# Patient Record
Sex: Female | Born: 1995 | Race: White | Hispanic: No | Marital: Single | Smoking: Current every day smoker
Health system: Southern US, Community
[De-identification: ages and names within clinical notes are randomized; demographics above are authoritative.]

## PROBLEM LIST (undated history)

## (undated) DIAGNOSIS — R12 Heartburn: Secondary | ICD-10-CM

## (undated) DIAGNOSIS — K769 Liver disease, unspecified: Secondary | ICD-10-CM

## (undated) DIAGNOSIS — M6282 Rhabdomyolysis: Secondary | ICD-10-CM

## (undated) DIAGNOSIS — F99 Mental disorder, not otherwise specified: Secondary | ICD-10-CM

## (undated) DIAGNOSIS — K219 Gastro-esophageal reflux disease without esophagitis: Secondary | ICD-10-CM

## (undated) DIAGNOSIS — M25561 Pain in right knee: Secondary | ICD-10-CM

## (undated) DIAGNOSIS — G8929 Other chronic pain: Secondary | ICD-10-CM

## (undated) DIAGNOSIS — M25562 Pain in left knee: Secondary | ICD-10-CM

## (undated) HISTORY — DX: Pain in left knee: M25.562

## (undated) HISTORY — DX: Liver disease, unspecified: K76.9

## (undated) HISTORY — DX: Mental disorder, not otherwise specified: F99

## (undated) HISTORY — PX: NO PAST SURGERIES: SHX2092

## (undated) HISTORY — DX: Pain in right knee: M25.561

## (undated) HISTORY — PX: MOUTH SURGERY: SHX715

## (undated) HISTORY — DX: Rhabdomyolysis: M62.82

## (undated) HISTORY — DX: Other chronic pain: G89.29

## (undated) HISTORY — DX: Heartburn: R12

---
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
## 2017-01-29 ENCOUNTER — Telehealth: Payer: Self-pay | Admitting: *Deleted

## 2017-01-29 ENCOUNTER — Telehealth: Payer: Self-pay | Admitting: Obstetrics & Gynecology

## 2017-01-29 NOTE — Telephone Encounter (Signed)
LMOVM returning call 

## 2017-01-29 NOTE — Telephone Encounter (Signed)
Patient states she feels like Nexplanon is poking out when she moves a certain way. States she felt it earlier today and has not noticed any swelling or redness to area . Informed patient that Nexplanon has to get settled in arm and to let us know if she has swelling or redness. Advised to call us in the next day or so if she feels like it needs to be looked at. Verbalized understanding.

## 2017-02-26 ENCOUNTER — Emergency Department (HOSPITAL_COMMUNITY)
Admission: EM | Admit: 2017-02-26 | Discharge: 2017-02-26 | Disposition: A | Discharge status: Home / Self Care | Payer: Medicaid Other

## 2017-05-09 ENCOUNTER — Encounter: Payer: Medicaid Other | Admitting: Women's Health

## 2017-05-09 ENCOUNTER — Telehealth: Payer: Self-pay | Admitting: *Deleted

## 2017-05-09 NOTE — Telephone Encounter (Signed)
Pt states that she doesn't want to have her nexplanon taken out. She was concerned because of weight gain. She states that she will try diet and exercise and see if she can keep some weight off. Pt requested that we cancel today's appointment.

## 2017-12-06 ENCOUNTER — Encounter (HOSPITAL_COMMUNITY): Payer: Self-pay | Admitting: Emergency Medicine

## 2017-12-06 ENCOUNTER — Other Ambulatory Visit: Payer: Self-pay

## 2017-12-06 ENCOUNTER — Emergency Department (HOSPITAL_COMMUNITY)
Admission: EM | Admit: 2017-12-06 | Discharge: 2017-12-06 | Disposition: A | Discharge status: Home / Self Care | Payer: No Typology Code available for payment source | Attending: Emergency Medicine | Admitting: Emergency Medicine

## 2017-12-06 DIAGNOSIS — Y92410 Unspecified street and highway as the place of occurrence of the external cause: Secondary | ICD-10-CM | POA: Insufficient documentation

## 2017-12-06 DIAGNOSIS — Y9389 Activity, other specified: Secondary | ICD-10-CM | POA: Insufficient documentation

## 2017-12-06 DIAGNOSIS — T07XXXA Unspecified multiple injuries, initial encounter: Secondary | ICD-10-CM

## 2017-12-06 DIAGNOSIS — Z79899 Other long term (current) drug therapy: Secondary | ICD-10-CM | POA: Insufficient documentation

## 2017-12-06 DIAGNOSIS — R51 Headache: Secondary | ICD-10-CM | POA: Insufficient documentation

## 2017-12-06 DIAGNOSIS — Y999 Unspecified external cause status: Secondary | ICD-10-CM | POA: Diagnosis not present

## 2017-12-06 DIAGNOSIS — F17210 Nicotine dependence, cigarettes, uncomplicated: Secondary | ICD-10-CM | POA: Diagnosis not present

## 2017-12-06 DIAGNOSIS — S8011XA Contusion of right lower leg, initial encounter: Secondary | ICD-10-CM | POA: Diagnosis not present

## 2017-12-06 DIAGNOSIS — S8012XA Contusion of left lower leg, initial encounter: Secondary | ICD-10-CM | POA: Insufficient documentation

## 2017-12-06 DIAGNOSIS — M79604 Pain in right leg: Secondary | ICD-10-CM | POA: Insufficient documentation

## 2017-12-06 DIAGNOSIS — M79605 Pain in left leg: Secondary | ICD-10-CM | POA: Diagnosis present

## 2017-12-06 MED ORDER — CYCLOBENZAPRINE HCL 10 MG PO TABS: 10.0000 mg | ORAL_TABLET | Freq: Two times a day (BID) | ORAL | 0 refills | Status: DC | PRN

## 2017-12-06 MED ORDER — CYCLOBENZAPRINE HCL 10 MG PO TABS
10.0000 mg | ORAL_TABLET | Freq: Once | ORAL | Status: AC
  Administered 2017-12-06: 10 mg via ORAL
  Filled 2017-12-06: qty 1

## 2017-12-06 MED ORDER — HYDROCODONE-ACETAMINOPHEN 5-325 MG PO TABS
1.0000 | ORAL_TABLET | Freq: Once | ORAL | Status: AC
  Administered 2017-12-06: 1 via ORAL
  Filled 2017-12-06: qty 1

## 2017-12-06 MED ORDER — NAPROXEN 500 MG PO TABS: 500.0000 mg | ORAL_TABLET | Freq: Two times a day (BID) | ORAL | 0 refills | Status: DC
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
## 2017-12-06 NOTE — Discharge Instructions (Addendum)
We are treating your pain and muscle spasm. Do not drive while taking the muscle relaxer as it will make you sleepy.

## 2017-12-12 ENCOUNTER — Emergency Department (HOSPITAL_COMMUNITY): Payer: Medicaid Other

## 2017-12-12 ENCOUNTER — Other Ambulatory Visit: Payer: Self-pay

## 2017-12-12 ENCOUNTER — Emergency Department (HOSPITAL_COMMUNITY)
Admission: EM | Admit: 2017-12-12 | Discharge: 2017-12-12 | Disposition: A | Discharge status: Home / Self Care | Payer: Medicaid Other | Attending: Emergency Medicine | Admitting: Emergency Medicine

## 2017-12-12 ENCOUNTER — Encounter (HOSPITAL_COMMUNITY): Payer: Self-pay

## 2017-12-12 DIAGNOSIS — F17210 Nicotine dependence, cigarettes, uncomplicated: Secondary | ICD-10-CM | POA: Insufficient documentation

## 2017-12-12 DIAGNOSIS — Y929 Unspecified place or not applicable: Secondary | ICD-10-CM | POA: Diagnosis not present

## 2017-12-12 DIAGNOSIS — Y939 Activity, unspecified: Secondary | ICD-10-CM | POA: Diagnosis not present

## 2017-12-12 DIAGNOSIS — S9032XA Contusion of left foot, initial encounter: Secondary | ICD-10-CM

## 2017-12-12 DIAGNOSIS — Z79899 Other long term (current) drug therapy: Secondary | ICD-10-CM | POA: Insufficient documentation

## 2017-12-12 DIAGNOSIS — S99922A Unspecified injury of left foot, initial encounter: Secondary | ICD-10-CM | POA: Diagnosis present

## 2017-12-12 DIAGNOSIS — Y999 Unspecified external cause status: Secondary | ICD-10-CM | POA: Insufficient documentation

## 2017-12-12 DIAGNOSIS — W208XXA Other cause of strike by thrown, projected or falling object, initial encounter: Secondary | ICD-10-CM | POA: Insufficient documentation

## 2017-12-12 NOTE — ED Notes (Signed)
Patient transported to X-ray 

## 2017-12-12 NOTE — Discharge Instructions (Addendum)
Elevate and apply ice packs on and off to your foot.  Call the orthopedic provider listed to arrange a follow-up appointment in 1 week if not improving.

## 2017-12-12 NOTE — ED Triage Notes (Signed)
Pt reports she was moving something heavy this morning and it was dropped on her left foot. Reports pain to side of foot. Slight bruising noted

## 2017-12-13 NOTE — ED Provider Notes (Signed)
[facility] EMERGENCY DEPARTMENT Provider Note   CSN: [id number] Arrival date & time: 12/12/17  1152     History   Chief Complaint Chief Complaint  Patient presents with  . Foot Pain    HPI Kimberly Norris is a 22 y.o. female.  HPI   Kimberly Norris is a 22 y.o. female who presents to the Emergency Department complaining of pain and swelling to the top of her left foot.  She states that a large piece of glass fell on the top of her foot.  Injury occurred on the morning of arrival.  She denies laceration or glass breakage.  She complains of pain along the top and lateral side of her foot.  Pain is worse with weightbearing.  She denies numbness or pain proximal to the ankle.    Past Medical History:  Diagnosis Date  . Bilateral chronic knee pain 04/16/2012  . Heartburn   . Mental disorder    anxiety, depression  . Rhabdomyolysis     Patient Active Problem List   Diagnosis Date Noted  . Nexplanon insertion 12/28/2016  . Traumatic rhabdomyolysis (HCC) 07/12/2015  . Rhabdomyolysis 07/12/2015  . BMI (body mass index), pediatric, 95-99% for age 28/16/2014  . 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 Bilateral chronic knee pain 04/16/2012    History reviewed. No pertinent surgical history.   OB History    Gravida  0   Para  0   Term  0   Preterm  0   AB  0   Living  0     SAB  0   TAB  0   Ectopic  0   Multiple  0   Live Births  0            Home Medications    Prior to Admission medications   Medication Sig Start Date End Date Taking? Authorizing Provider  cyclobenzaprine (FLEXERIL) 10 MG tablet Take 1 tablet (10 mg total) by mouth 2 (two) times daily as needed for muscle spasms. 12/06/17   Janne NapoleonNeese, Hope M, NP  naproxen (NAPROSYN) 500 MG tablet Take 1 tablet (500 mg total) by mouth 2 (two) times daily. 12/06/17   Janne NapoleonNeese, Hope M, NP  Norgestimate-Ethinyl Estradiol Triphasic (TRI-SPRINTEC) 0.18/0.215/0.25 MG-35 MCG tablet Take 1 tablet by  mouth daily.    [provider]  Olopatadine HCl (PATADAY) 0.2 % SOLN Apply to eye daily.    [provider]    Family History Family History  Problem Relation Age of Onset  . Autism Sister        1 sister    Social History Social History   Tobacco Use  . Smoking status: Current Every Day Smoker    Packs/day: 0.50    Types: Cigarettes  . Smokeless tobacco: Never Used  . Tobacco comment: smokes 1-2 cig daily  Substance Use Topics  . Alcohol use: No  . Drug use: No     Allergies   Patient has no known allergies.   Review of Systems Review of Systems  Constitutional: Negative for chills and fever.  Genitourinary: Negative for difficulty urinating and dysuria.  Musculoskeletal: Positive for arthralgias and joint swelling.       Left foot pain and swelling  Skin: Negative for color change and wound.     Physical Exam Updated Vital Signs BP 117/68   Pulse 68   Temp 98.5 F (36.9 C) (Oral)   Resp 16   Ht 5\' 1"  (1.549 m)  Wt 77.1 kg   SpO2 100%   BMI 32.12 kg/m   Physical Exam  Constitutional: She appears well-developed and well-nourished. No distress.  HENT:  Head: Atraumatic.  Cardiovascular: Normal rate, regular rhythm and intact distal pulses.  Pulmonary/Chest: Effort normal and breath sounds normal.  Musculoskeletal: She exhibits edema and tenderness. She exhibits no deformity.  Tender to palpation of the anterior and lateral left foot.  No bony deformity.  Ankle nontender.  No edema or tenderness of the toes.  No open wounds.  Neurological: She is alert. No sensory deficit.  Skin: Skin is warm and dry. Capillary refill takes less than 2 seconds. No erythema.  Nursing note and vitals reviewed.    ED Treatments / Results  Labs (all labs ordered are listed, but only abnormal results are displayed) Labs Reviewed - No data to display  EKG None  Radiology Dg Foot Complete Left  Result Date: 12/12/2017 CLINICAL DATA:   22 year old female status post blunt trauma from dropped heavy piece of glass. Pain and swelling. EXAM: LEFT FOOT - COMPLETE 3+ VIEW COMPARISON:  None. FINDINGS: Bone mineralization is within normal limits. There is no evidence of fracture or dislocation. There is no evidence of arthropathy or other focal bone abnormality. No discrete soft tissue injury. No subcutaneous gas. No radiopaque foreign body identified. IMPRESSION: Negative. Electronically Signed   By: Odessa Fleming M.D.   On: 12/12/2017 12:37    Procedures Procedures (including critical care time)  Medications Ordered in ED Medications - No data to display   Initial Impression / Assessment and Plan / ED Course  I have reviewed the triage vital signs and the nursing notes.  Pertinent labs & imaging results that were available during my care of the patient were reviewed by me and considered in my medical decision making (see chart for details).     Patient with contusion of left foot.  Neurovascularly intact.  Compartments are soft.  She agrees to RICE therapy and outpatient follow-up if needed.  Final Clinical Impressions(s) / ED Diagnoses   Final diagnoses:  Contusion of left foot, initial encounter    ED Discharge Orders    None       Rosey Bath 12/13/17 2202    Donnetta Hutching, MD 12/14/17 [phone]

## 2018-04-23 ENCOUNTER — Other Ambulatory Visit: Payer: Self-pay | Admitting: Adult Health

## 2018-06-21 ENCOUNTER — Other Ambulatory Visit: Payer: Self-pay | Admitting: Adult Health

## 2018-07-24 ENCOUNTER — Telehealth: Payer: Self-pay | Admitting: Adult Health

## 2018-07-24 NOTE — Telephone Encounter (Signed)

## 2018-07-25 ENCOUNTER — Encounter: Payer: Self-pay | Admitting: Adult Health

## 2018-07-25 ENCOUNTER — Ambulatory Visit (INDEPENDENT_AMBULATORY_CARE_PROVIDER_SITE_OTHER): Payer: Medicaid Other | Admitting: Adult Health

## 2018-07-25 ENCOUNTER — Other Ambulatory Visit (HOSPITAL_COMMUNITY)
Admission: RE | Admit: 2018-07-25 | Discharge: 2018-07-25 | Disposition: A | Discharge status: Home / Self Care | Payer: Medicaid Other | Source: Ambulatory Visit | Attending: Adult Health | Admitting: Adult Health

## 2018-07-25 ENCOUNTER — Other Ambulatory Visit: Payer: Self-pay

## 2018-07-25 VITALS — BP 128/65 | HR 87 | Ht 61.2 in | Wt 195.8 lb

## 2018-07-25 DIAGNOSIS — Z0000 Encounter for general adult medical examination without abnormal findings: Secondary | ICD-10-CM

## 2018-07-25 DIAGNOSIS — Z975 Presence of (intrauterine) contraceptive device: Secondary | ICD-10-CM

## 2018-07-25 DIAGNOSIS — Z01419 Encounter for gynecological examination (general) (routine) without abnormal findings: Secondary | ICD-10-CM | POA: Insufficient documentation

## 2018-07-25 NOTE — Addendum Note (Signed)
Addended by: Diona Fanti A on: 07/25/2018 10:56 AM   Modules accepted: Orders

## 2018-07-25 NOTE — Progress Notes (Signed)
Patient ID: Kimberly Norris, female   DOB: 10-23-1995, 23 y.o.   MRN: [id number] History of Present Illness: Kimberly Norris is a 23 year old white female, single, G0P0, in for a well woman gyn exam and first pap. PCP is [facility].    Current Medications, Allergies, Past Medical History, Past Surgical History, Family History and Social History were reviewed in [employer] record.     Review of Systems: Patient denies any headaches, hearing loss, fatigue, blurred vision, shortness of breath, chest pain, abdominal pain, problems with bowel movements, urination, or intercourse. No joint pain or mood swings. She has nexplanon and is trying to lose weight    Physical Exam:BP 128/65 (BP Location: Right Arm, Patient Position: Sitting, Cuff Size: Normal)   Pulse 87   Ht 5' 1.2" (1.554 m)   Wt 195 lb 12.8 oz (88.8 kg)   BMI 36.75 kg/m  General:  Well developed, well nourished, no acute distress Skin:  Warm and dry Neck:  Midline trachea, normal thyroid, good ROM, no lymphadenopathy Lungs; Clear to auscultation bilaterally Breast:  No dominant palpable mass, retraction, or nipple discharge Cardiovascular: Regular rate and rhythm Abdomen:  Soft, non tender, no hepatosplenomegaly Pelvic:  External genitalia is normal in appearance, no lesions.  The vagina is normal in appearance. Urethra has no lesions or masses. The cervix is nulliparous, pap with GC/CHL performed  Uterus is felt to be normal size, shape, and contour.  No adnexal masses or tenderness noted.Bladder is non tender, no masses felt. Extremities/musculoskeletal:  No swelling or varicosities noted, no clubbing or cyanosis Psych:  No mood changes, alert and cooperative,seems happy PHQ 9 score is 2, denies being suicidal,just stressed Fall risk is low. Examination chaperoned by Levy Pupa LPN. She declines labs.   Impression: 1. Encounter for gynecological examination with Papanicolaou smear of cervix   2. Nexplanon in  place       Plan: Physical in 1 year Pap in 3 if normal Use condoms Will want another nexplanon when this one due out in 2021

## 2018-07-26 LAB — CYTOLOGY - PAP
Adequacy: ABSENT
Chlamydia: NEGATIVE
Diagnosis: NEGATIVE
Neisseria Gonorrhea: NEGATIVE

## 2018-10-10 ENCOUNTER — Other Ambulatory Visit: Payer: Self-pay

## 2018-10-10 DIAGNOSIS — Z20822 Contact with and (suspected) exposure to covid-19: Secondary | ICD-10-CM

## 2018-10-11 LAB — NOVEL CORONAVIRUS, NAA: SARS-CoV-2, NAA: NOT DETECTED

## 2018-11-01 ENCOUNTER — Telehealth: Payer: Self-pay | Admitting: *Deleted

## 2018-11-01 NOTE — Telephone Encounter (Signed)
Called patient back and let her know that kim said no need to worry. Pt will call back if she feels like she wants to do self swab.

## 2018-11-01 NOTE — Telephone Encounter (Signed)
Called patient back and discussed her concerns. She states that she has never had a period in the two years that shes been on the nexplanon. She recently took antibiotics for a chest cold this week. And then starting bleeding a couple days after finishing. It is bleeding like a period and cramping. Also has nausea. Advised she can take tylenol for cramps. She asked if I would check with a provider to see if she should be worried. I told her I would route conversation to provider and let her know what they say. No other questions or concerns at this time.

## 2018-11-01 NOTE — Telephone Encounter (Signed)
Patient states she was prescribed antibiotics by her PCP and is now bleeding. She has the Nexplanon and has never had bleeding before when taking antibiotics and wants to know if the two are related.

## 2018-11-14 ENCOUNTER — Encounter: Payer: Medicaid Other | Admitting: Advanced Practice Midwife

## 2018-12-02 ENCOUNTER — Encounter: Payer: Medicaid Other | Admitting: Advanced Practice Midwife

## 2018-12-05 ENCOUNTER — Other Ambulatory Visit: Payer: Self-pay

## 2018-12-05 ENCOUNTER — Ambulatory Visit (INDEPENDENT_AMBULATORY_CARE_PROVIDER_SITE_OTHER): Payer: Medicaid Other | Admitting: Advanced Practice Midwife

## 2018-12-05 ENCOUNTER — Encounter: Payer: Self-pay | Admitting: Advanced Practice Midwife

## 2018-12-05 VITALS — BP 130/77 | HR 77 | Ht 61.0 in | Wt 200.0 lb

## 2018-12-05 DIAGNOSIS — Z3046 Encounter for surveillance of implantable subdermal contraceptive: Secondary | ICD-10-CM | POA: Diagnosis not present

## 2018-12-05 NOTE — Progress Notes (Addendum)
GYNECOLOGY CLINIC PROCEDURE NOTE  Kimberly Norris is a 23 y.o. G0P0000 here for Nexplanon removal due to an almost 60 lb. weight gain. She would like to see if the Nexplanon is what is the cause of her weight gain. Last pap smear was on 07/25/2018 and was normal. No other gynecologic concerns. She is not having sex with anyone now and doesn't want contraception. Options discussed--condoms encouraged for any new partners.  Nexplanon Removal Patient identified, informed consent performed, consent signed. Appropriate time out taken. Nexplanon site identified. Area prepped in usual sterile fashon. Two ml of 1% lidocaine was used to anesthetize the area at the distal end of the implant. A small stab incision was made right beside the implant on the distal portion. The Nexplanon rod was grasped using hemostats and removed without difficulty.  There was minimal blood loss. There were no complications. Steri-strips were applied over the small incision.  A pressure bandage was applied to reduce any bruising.  The patient tolerated the procedure well and was given post procedure instructions.  Patient is not currently sexually active and does not plan to use contraception at this time. Patient was given education on other forms of contraception and told to call the office if her situation changes.  Nigel Berthold, CNM was at bedside for entire procedure.   Maryagnes Amos, SNM 10:06 AM

## 2019-02-13 ENCOUNTER — Encounter: Payer: Medicaid Other | Admitting: Advanced Practice Midwife

## 2019-03-06 ENCOUNTER — Emergency Department (HOSPITAL_COMMUNITY)
Admission: EM | Admit: 2019-03-06 | Discharge: 2019-03-06 | Disposition: A | Discharge status: Home / Self Care | Payer: Medicaid Other | Attending: Emergency Medicine | Admitting: Emergency Medicine

## 2019-03-06 ENCOUNTER — Encounter (HOSPITAL_COMMUNITY): Payer: Self-pay | Admitting: Emergency Medicine

## 2019-03-06 ENCOUNTER — Other Ambulatory Visit: Payer: Self-pay

## 2019-03-06 ENCOUNTER — Encounter: Payer: Medicaid Other | Admitting: Advanced Practice Midwife

## 2019-03-06 DIAGNOSIS — N390 Urinary tract infection, site not specified: Secondary | ICD-10-CM | POA: Diagnosis not present

## 2019-03-06 DIAGNOSIS — R1905 Periumbilic swelling, mass or lump: Secondary | ICD-10-CM | POA: Diagnosis present

## 2019-03-06 DIAGNOSIS — F17210 Nicotine dependence, cigarettes, uncomplicated: Secondary | ICD-10-CM | POA: Diagnosis not present

## 2019-03-06 LAB — COMPREHENSIVE METABOLIC PANEL
ALT: 21 U/L (ref 0–44)
AST: 19 U/L (ref 15–41)
Albumin: 4.2 g/dL (ref 3.5–5.0)
Alkaline Phosphatase: 67 U/L (ref 38–126)
Anion gap: 9 (ref 5–15)
BUN: 6 mg/dL (ref 6–20)
CO2: 26 mmol/L (ref 22–32)
Calcium: 9.4 mg/dL (ref 8.9–10.3)
Chloride: 104 mmol/L (ref 98–111)
Creatinine, Ser: 0.66 mg/dL (ref 0.44–1.00)
GFR calc Af Amer: >60 mL/min (ref >60)
GFR calc non Af Amer: >60 mL/min (ref >60)
Glucose, Bld: 93 mg/dL (ref 70–99)
Potassium: 4.1 mmol/L (ref 3.5–5.1)
Sodium: 139 mmol/L (ref 135–145)
Total Bilirubin: 0.6 mg/dL (ref 0.3–1.2)
Total Protein: 7.6 g/dL (ref 6.5–8.1)

## 2019-03-06 LAB — CBC WITH DIFFERENTIAL/PLATELET
Abs Immature Granulocytes: 0.04 10*3/uL (ref 0.00–0.07)
Basophils Absolute: 0.1 10*3/uL (ref 0.0–0.1)
Basophils Relative: 1 %
Eosinophils Absolute: 0.2 10*3/uL (ref 0.0–0.5)
Eosinophils Relative: 2 %
HCT: 42.6 % (ref 36.0–46.0)
Hemoglobin: 13.9 g/dL (ref 12.0–15.0)
Immature Granulocytes: 0 %
Lymphocytes Relative: 28 %
Lymphs Abs: 3 10*3/uL (ref 0.7–4.0)
MCH: 32.3 pg (ref 26.0–34.0)
MCHC: 32.6 g/dL (ref 30.0–36.0)
MCV: 98.8 fL (ref 80.0–100.0)
Monocytes Absolute: 0.5 10*3/uL (ref 0.1–1.0)
Monocytes Relative: 5 %
Neutro Abs: 7 10*3/uL (ref 1.7–7.7)
Neutrophils Relative %: 64 %
Platelets: 314 10*3/uL (ref 150–400)
RBC: 4.31 MIL/uL (ref 3.87–5.11)
RDW: 12.4 % (ref 11.5–15.5)
WBC: 10.8 10*3/uL — ABNORMAL HIGH (ref 4.0–10.5)
nRBC: 0 % (ref 0.0–0.2)

## 2019-03-06 LAB — URINALYSIS, ROUTINE W REFLEX MICROSCOPIC
Bilirubin Urine: NEGATIVE
Glucose, UA: NEGATIVE mg/dL
Ketones, ur: NEGATIVE mg/dL
Nitrite: NEGATIVE
Protein, ur: 30 mg/dL — AB
Specific Gravity, Urine: 1.021 (ref 1.005–1.030)
pH: 7 (ref 5.0–8.0)

## 2019-03-06 LAB — PREGNANCY, URINE: Preg Test, Ur: NEGATIVE

## 2019-03-06 MED ORDER — CEPHALEXIN 500 MG PO CAPS
500.0000 mg | ORAL_CAPSULE | Freq: Once | ORAL | Status: AC
  Administered 2019-03-06: 500 mg via ORAL
  Filled 2019-03-06: qty 1

## 2019-03-06 MED ORDER — CEPHALEXIN 500 MG PO CAPS
500.0000 mg | ORAL_CAPSULE | Freq: Four times a day (QID) | ORAL | 0 refills | Status: DC
Start: 2019-03-06 — End: 2019-08-18

## 2019-03-06 NOTE — ED Provider Notes (Signed)
[facility] Provider Note   CSN: [id number] Arrival date & time: 03/06/19  1220     History No chief complaint on file.   Kimberly Norris is a 24 y.o. female.  HPI     Kimberly Norris is a 24 y.o. female who presents to the Emergency Department requesting evaluation of a localized area of swelling of her right periumbilical area.  She has noticed the swelling since November.  She describes tenderness and nausea when the area is palpated deeply.  She denies known trauma or previous abdominal surgeries.  No fever or chills.  She admits to increased urinary frequency, but denies burning or pain with urination.  No bowel changes, no vomiting.  She states that she was concerned that this may be an abscess or something serious and is here requesting evaluation.  No new or worsening symptoms.    Past Medical History:  Diagnosis Date  . Bilateral chronic knee pain 04/16/2012  . Heartburn   . Mental disorder    anxiety, depression  . Rhabdomyolysis     Patient Active Problem List   Diagnosis Date Noted  . Encounter for gynecological examination with Papanicolaou smear of cervix 07/25/2018  . Traumatic rhabdomyolysis ([place]) 07/12/2015  . Rhabdomyolysis 07/12/2015  . BMI (body mass index), pediatric, 95-99% for age 71/16/2014  . 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 Bilateral chronic knee pain 04/16/2012    No past surgical history on file.   OB History    Gravida  0   Para  0   Term  0   Preterm  0   AB  0   Living  0     SAB  0   TAB  0   Ectopic  0   Multiple  0   Live Births  0           Family History  Problem Relation Age of Onset  . Autism Sister        1 sister    Social History   Tobacco Use  . Smoking status: Current Every Day Smoker    Packs/day: 0.50    Types: Cigarettes  . Smokeless tobacco: Never Used  . Tobacco comment: smokes 1-2 cig daily  Substance Use Topics  . Alcohol use: No  . Drug use: No     Home Medications Prior to Admission medications   Not on File    Allergies    Patient has no known allergies.  Review of Systems   Review of Systems  Constitutional: Negative for chills and fever.  Respiratory: Negative for shortness of breath.   Cardiovascular: Negative for chest pain.  Gastrointestinal: Positive for abdominal pain and nausea. Negative for constipation, diarrhea and vomiting.  Genitourinary: Negative for decreased urine volume, difficulty urinating, dysuria and flank pain.  Musculoskeletal: Negative for arthralgias and joint swelling.  Skin: Negative for color change and wound.  Neurological: Negative for weakness, numbness and headaches.  Hematological: Negative for adenopathy.    Physical Exam Updated Vital Signs BP 135/85 (BP Location: Left Arm)   Pulse 89   Temp 99 F (37.2 C) (Oral)   Resp 18   Ht 5\' 1"  (1.549 m)   Wt 86.2 kg   LMP 03/01/2019   SpO2 99%   BMI 35.90 kg/m   Physical Exam Vitals and nursing note reviewed.  Constitutional:      General: She is not in acute distress.    Appearance: She is well-developed. She  is not ill-appearing.  HENT:     Mouth/Throat:     Mouth: Mucous membranes are moist.  Cardiovascular:     Rate and Rhythm: Normal rate and regular rhythm.     Pulses: Normal pulses.  Pulmonary:     Effort: Pulmonary effort is normal. No respiratory distress.     Breath sounds: Normal breath sounds.  Abdominal:     General: There is no distension.     Palpations: Abdomen is soft. There is no mass.     Tenderness: There is abdominal tenderness. There is no right CVA tenderness or left CVA tenderness. Negative signs include McBurney's sign.       Comments: Very focal tenderness to deep palpation just right of the umbilicus.  No palpable masses.  No fluctuance, excessive warmth or erythema. No guarding or rebound tenderness. No peritoneal signs  Musculoskeletal:        General: Normal range of motion.  Skin:     General: Skin is warm.     Capillary Refill: Capillary refill takes less than 2 seconds.     Findings: No abscess, erythema or rash.  Neurological:     General: No focal deficit present.     Mental Status: She is alert.     Sensory: No sensory deficit.     Motor: No weakness or abnormal muscle tone.     ED Results / Procedures / Treatments   Labs (all labs ordered are listed, but only abnormal results are displayed) Labs Reviewed  CBC WITH DIFFERENTIAL/PLATELET - Abnormal; Notable for the following components:      Result Value   WBC 10.8 (*)    All other components within normal limits  URINALYSIS, ROUTINE W REFLEX MICROSCOPIC - Abnormal; Notable for the following components:   APPearance CLOUDY (*)    Hgb urine dipstick LARGE (*)    Protein, ur 30 (*)    Leukocytes,Ua LARGE (*)    Bacteria, UA RARE (*)    All other components within normal limits  URINE CULTURE  COMPREHENSIVE METABOLIC PANEL  PREGNANCY, URINE    EKG None  Radiology No results found.  Procedures Procedures (including critical care time)  Medications Ordered in ED Medications - No data to display  ED Course  I have reviewed the triage vital signs and the nursing notes.  Pertinent labs & imaging results that were available during my care of the patient were reviewed by me and considered in my medical decision making (see chart for details).    MDM Rules/Calculators/A&P                      Pt with focal tenderness of the right abdomen for 2-3 months.  No concerning sx's for acute abdomen.  No definite abscess or hernia.  I do not feel that imaging is warranted given duration of symptoms and lack of significant clinical findings.   U/A shows possible UTI, pt endorses increased urinary frequency.  No flank pain, or fever.  doubt pyelonephritis.  Will treat with Keflex.  Urine culture pending.  Labs reassuring. Pt agrees to f/u with PCP, return precautions discussed.   Final Clinical Impression(s)  / ED Diagnoses Final diagnoses:  Lower urinary tract infectious disease    Rx / DC Orders ED Discharge Orders    None       Pauline Aus, PA-C 03/06/19 1552    Linwood Dibbles, MD 03/07/19 [phone]

## 2019-03-06 NOTE — Discharge Instructions (Addendum)
Your test today show that you have a urinary tract infection.  Continue to drink plenty of water.  Take the antibiotic as directed until its finished.  Follow-up with your primary provider for recheck.  Return to the emergency department if you develop worsening symptoms such as increasing abdominal pain, fever, and vomiting

## 2019-03-06 NOTE — ED Triage Notes (Signed)
PT c/o swollen area to right of her umbilicus x2 months and experiences pain and nausea when she palpates it or tries to lay on her stomach.

## 2019-03-09 LAB — URINE CULTURE: Culture: 10000 — AB

## 2019-03-10 ENCOUNTER — Telehealth: Payer: Self-pay | Admitting: *Deleted

## 2019-03-10 NOTE — Telephone Encounter (Signed)
Post ED Visit - Positive Culture Follow-up  Culture report reviewed by antimicrobial stewardship pharmacist: [facility] []  , Pharm.D. []  Enzo Bi, Pharm.D., BCPS AQ-ID []  , Pharm.D., BCPS []  Celedonio Miyamoto, Pharm.D., BCPS []  [place], Garvin Fila.D., BCPS, AAHIVP []  , Pharm.D., BCPS, AAHIVP []  Georgina Pillion, PharmD, BCPS []  , PharmD, BCPS []  [place], PharmD, BCPS []  [address], PharmD []  , PharmD, BCPS []  Estella Husk, PharmD , PharmD  [facility] []  , PharmD []  Phillips Climes, PharmD []  , PharmD []  Agapito Games, Rph []  ) Verlan Friends, PharmD []  , PharmD []  Mervyn Gay, PharmD []  , PharmD []  Vinnie Level, PharmD []  Gerrit Halls, PharmD []  W[facility], PharmD []  , PharmD []  Len Childs, PharmD   Positive urine culture Treated with Cephalexin, organism sensitive to the same and no further patient follow-up is required at this time.  [facility] 03/10/2019, 9:33 AM

## 2019-03-13 ENCOUNTER — Ambulatory Visit (INDEPENDENT_AMBULATORY_CARE_PROVIDER_SITE_OTHER): Payer: Medicaid Other | Admitting: Advanced Practice Midwife

## 2019-03-13 ENCOUNTER — Encounter: Payer: Self-pay | Admitting: Advanced Practice Midwife

## 2019-03-13 ENCOUNTER — Other Ambulatory Visit: Payer: Self-pay

## 2019-03-13 VITALS — BP 136/89 | HR 97 | Ht 61.0 in | Wt 196.0 lb

## 2019-03-13 DIAGNOSIS — Z3202 Encounter for pregnancy test, result negative: Secondary | ICD-10-CM | POA: Diagnosis not present

## 2019-03-13 DIAGNOSIS — Z30017 Encounter for initial prescription of implantable subdermal contraceptive: Secondary | ICD-10-CM

## 2019-03-13 LAB — POCT URINE PREGNANCY: Preg Test, Ur: NEGATIVE

## 2019-03-13 NOTE — Progress Notes (Signed)
  HPI:  Kimberly Norris is a 24 y.o. year old  female here for Nexplanon insertion.  Her LMP was 2/13, no sex since January , and her pregnancy test today was negative.  Risks/benefits/side effects of Nexplanon have been discussed and her questions have been answered.  Specifically, a failure rate of 01/998 has been reported, with an increased failure rate if pt takes St. John's Wort and/or antiseizure medicaitons.  Kimberly Norris is aware of the common side effect of irregular bleeding, which the incidence of decreases over time.   Past Medical History: Past Medical History:  Diagnosis Date  . Bilateral chronic knee pain 04/16/2012  . Heartburn   . Mental disorder    anxiety, depression  . Rhabdomyolysis     Past Surgical History: History reviewed. No pertinent surgical history.  Family History: Family History  Problem Relation Age of Onset  . Autism Sister        1 sister    Social History: Social History   Tobacco Use  . Smoking status: Current Every Day Smoker    Packs/day: 0.50    Types: Cigarettes  . Smokeless tobacco: Never Used  . Tobacco comment: smokes 1-2 cig daily  Substance Use Topics  . Alcohol use: No  . Drug use: No    Allergies:  Allergies  Allergen Reactions  . Flagyl [Metronidazole] Rash      Her left arm, approximatly 4 inches proximal from the elbow, was cleansed with alcohol and anesthetized with 2cc of 2% Lidocaine.  The area was cleansed again and the Nexplanon was inserted without difficulty.  A pressure bandage was applied.  Pt was instructed to remove pressure bandage in a few hours, and keep insertion site covered with a bandaid for 3 days.  Back up contraception was recommended for 2 weeks.  Follow-up scheduled PRN problems  Jacklyn Shell 03/13/2019 2:37 PM

## 2019-04-24 ENCOUNTER — Other Ambulatory Visit: Payer: Self-pay | Admitting: Sports Medicine

## 2019-04-24 ENCOUNTER — Other Ambulatory Visit (HOSPITAL_COMMUNITY): Payer: Self-pay | Admitting: Sports Medicine

## 2019-04-24 DIAGNOSIS — R1011 Right upper quadrant pain: Secondary | ICD-10-CM

## 2019-04-24 DIAGNOSIS — K429 Umbilical hernia without obstruction or gangrene: Secondary | ICD-10-CM

## 2019-04-29 ENCOUNTER — Encounter (HOSPITAL_COMMUNITY): Payer: Self-pay

## 2019-04-29 ENCOUNTER — Ambulatory Visit (HOSPITAL_COMMUNITY): Payer: Medicaid Other

## 2019-05-05 ENCOUNTER — Ambulatory Visit (HOSPITAL_COMMUNITY): Payer: Medicaid Other

## 2019-05-05 ENCOUNTER — Encounter (HOSPITAL_COMMUNITY): Payer: Self-pay

## 2019-05-08 ENCOUNTER — Ambulatory Visit (HOSPITAL_COMMUNITY): Admission: RE | Admit: 2019-05-08 | Payer: Medicaid Other | Source: Ambulatory Visit

## 2019-05-19 ENCOUNTER — Other Ambulatory Visit: Payer: Self-pay

## 2019-05-19 ENCOUNTER — Ambulatory Visit (HOSPITAL_COMMUNITY)
Admission: RE | Admit: 2019-05-19 | Discharge: 2019-05-19 | Disposition: A | Discharge status: Home / Self Care | Payer: Medicaid Other | Source: Ambulatory Visit | Attending: Sports Medicine | Admitting: Sports Medicine

## 2019-05-19 DIAGNOSIS — K429 Umbilical hernia without obstruction or gangrene: Secondary | ICD-10-CM

## 2019-05-19 DIAGNOSIS — R1011 Right upper quadrant pain: Secondary | ICD-10-CM | POA: Diagnosis present

## 2019-05-29 ENCOUNTER — Encounter: Payer: Self-pay | Admitting: Nurse Practitioner

## 2019-06-24 ENCOUNTER — Encounter: Payer: Self-pay | Admitting: Internal Medicine

## 2019-06-24 ENCOUNTER — Ambulatory Visit: Payer: Medicaid Other | Admitting: Nurse Practitioner

## 2019-06-24 NOTE — Progress Notes (Deleted)
Primary Care Physician:  [facility] Primary Gastroenterologist:  Dr. Jena Gauss  No chief complaint on file.   HPI:   Kimberly Norris is a 24 y.o. female who presents on referral from primary care for rectal quadrant pain.  Reviewed information provided with referral including ***.  No history of endoscopy or colonoscopy in our system.  Today she states   Past Medical History:  Diagnosis Date  . Bilateral chronic knee pain 04/16/2012  . Heartburn   . Mental disorder    anxiety, depression  . Rhabdomyolysis     No past surgical history on file.  Current Outpatient Medications  Medication Sig Dispense Refill  . cephALEXin (KEFLEX) 500 MG capsule Take 1 capsule (500 mg total) by mouth 4 (four) times daily. 28 capsule 0   No current facility-administered medications for this visit.    Allergies as of 06/24/2019 - Review Complete 03/13/2019  Allergen Reaction Noted  . Flagyl [metronidazole] Rash 03/06/2019    Family History  Problem Relation Age of Onset  . Autism Sister        1 sister    Social History   Socioeconomic History  . Marital status: Single    Spouse name: Not on file  . Number of children: Not on file  . Years of education: Not on file  . Highest education level: Not on file  Occupational History  . Not on file  Tobacco Use  . Smoking status: Current Every Day Smoker    Packs/day: 0.50    Types: Cigarettes  . Smokeless tobacco: Never Used  . Tobacco comment: smokes 1-2 cig daily  Substance and Sexual Activity  . Alcohol use: No  . Drug use: No  . Sexual activity: Not Currently    Birth control/protection: None  Other Topics Concern  . Not on file  Social History Narrative  . Not on file   Social Determinants of Health   Financial Resource Strain:   . Difficulty of Paying Living Expenses:   Food Insecurity:   . Worried About Programme researcher, broadcasting/film/video in the Last Year:   . Barista in the Last Year:     Transportation Needs:   . Freight forwarder (Medical):   Marland Kitchen Lack of Transportation (Non-Medical):   Physical Activity:   . Days of Exercise per Week:   . Minutes of Exercise per Session:   Stress:   . Feeling of Stress :   Social Connections:   . Frequency of Communication with Friends and Family:   . Frequency of Social Gatherings with Friends and Family:   . Attends Religious Services:   . Active Member of Clubs or Organizations:   . Attends Banker Meetings:   Marland Kitchen Marital Status:   Intimate Partner Violence:   . Fear of Current or Ex-Partner:   . Emotionally Abused:   Marland Kitchen Physically Abused:   . Sexually Abused:     Subjective: Review of Systems  Constitutional: Negative for chills, fever, malaise/fatigue and weight loss.  HENT: Negative for congestion and sore throat.   Respiratory: Negative for cough and shortness of breath.   Cardiovascular: Negative for chest pain and palpitations.  Gastrointestinal: Negative for abdominal pain, blood in stool, diarrhea, melena, nausea and vomiting.  Musculoskeletal: Negative for joint pain and myalgias.  Skin: Negative for rash.  Neurological: Negative for dizziness and weakness.  Endo/Heme/Allergies: Does not bruise/bleed easily.  Psychiatric/Behavioral: Negative for depression. The patient is not nervous/anxious.  All other systems reviewed and are negative.      Objective: There were no vitals taken for this visit. Physical Exam Vitals and nursing note reviewed.  Constitutional:      General: She is not in acute distress.    Appearance: Normal appearance. She is well-developed. She is not ill-appearing, toxic-appearing or diaphoretic.  HENT:     Head: Normocephalic and atraumatic.     Nose: No congestion or rhinorrhea.  Eyes:     General: No scleral icterus. Cardiovascular:     Rate and Rhythm: Normal rate and regular rhythm.     Heart sounds: Normal heart sounds.  Pulmonary:     Effort: Pulmonary  effort is normal. No respiratory distress.     Breath sounds: Normal breath sounds.  Abdominal:     General: Bowel sounds are normal.     Palpations: Abdomen is soft. There is no hepatomegaly, splenomegaly or mass.     Tenderness: There is no abdominal tenderness. There is no guarding or rebound.     Hernia: No hernia is present.  Skin:    General: Skin is warm and dry.     Coloration: Skin is not jaundiced.     Findings: No rash.  Neurological:     General: No focal deficit present.     Mental Status: She is alert and oriented to person, place, and time.  Psychiatric:        Attention and Perception: Attention normal.        Mood and Affect: Mood normal.        Speech: Speech normal.        Behavior: Behavior normal.        Thought Content: Thought content normal.        Cognition and Memory: Cognition and memory normal.       06/24/2019 1:28 PM   Disclaimer: This note was dictated with voice recognition software. Similar sounding words can inadvertently be transcribed and may not be corrected upon review.

## 2019-07-02 ENCOUNTER — Encounter: Payer: Self-pay | Admitting: Nurse Practitioner

## 2019-07-02 ENCOUNTER — Encounter: Payer: Self-pay | Admitting: Internal Medicine

## 2019-08-17 NOTE — Progress Notes (Signed)
Referring Provider: Waldon Reining, MD Primary Care Physician:  [facility] Primary Gastroenterologist: Pending Dr. Marletta Lor  Chief Complaint  Patient presents with  . Nausea    no vomiting, no appetite  . ruq pain    comes/goes, tenderness   HPI:   Kimberly Norris is a 24 y.o. female presenting today at the request of Waldon Reining, MD for RUQ pain and fatty liver.  RUQ ultrasound 05/19/2019: No gallstones or wall thickening.  No sonographic Murphy sign.  CBD 2-3 mm.  Relatively increased echogenicity of liver parenchyma. Labs in February 2021 with LFTs within normal limits.  Today:  Mild tenderness in the right side of her abdomen. Started in October 2020. Feels like there is a bulge. Doesn't cause any pain, she just notices it if she presses on it. No RUQ pain when eating. Has nausea daily. No vomiting. Nausea worsens with eating.  Reports weight loss.  States she used to be 200 pounds.  Unable to eat much.  States watermelon is the only thing that does not make her nauseated.  Later states she actually used to be very small around 135 lbs but she gained quite a bit of weight due to stress and missing her goal weight when trying to get into the [employer].  Has heartburn/reflux daily and early satiety. Doesn't take anything for this. No dysphagia. Eating some spicy foods when she has an appetite. Drinks 1-2 soda daily.   BMs daily. No constipation or diarrhea. No blood in the stool or black stool.   Advil last week after dental work. BC powders for headaches maybe twice a month.  Previously had been prescribed Phenergan which made nausea worse.  Past Medical History:  Diagnosis Date  . Bilateral chronic knee pain 04/16/2012  . Heartburn   . Mental disorder    anxiety, depression  . Rhabdomyolysis     No past surgical history on file.  Current Outpatient Medications  Medication Sig Dispense Refill  . etonogestrel (NEXPLANON) 68 MG IMPL implant  Inject 68 mg into the skin once.     . calcium carbonate (TUMS - DOSED IN MG ELEMENTAL CALCIUM) 500 MG chewable tablet Chew 1 tablet by mouth 2 (two) times daily as needed for indigestion or heartburn.    . ondansetron (ZOFRAN) 4 MG tablet Take 1 tablet (4 mg total) by mouth every 8 (eight) hours as needed for nausea or vomiting. 30 tablet 1  . pantoprazole (PROTONIX) 40 MG tablet Take 1 tablet (40 mg total) by mouth daily. 90 tablet 3   No current facility-administered medications for this visit.    Allergies as of 08/18/2019 - Review Complete 08/18/2019  Allergen Reaction Noted  . Flagyl [metronidazole] Rash 03/06/2019    Family History  Problem Relation Age of Onset  . Autism Sister        1 sister    Social History   Socioeconomic History  . Marital status: Single    Spouse name: Not on file  . Number of children: Not on file  . Years of education: Not on file  . Highest education level: Not on file  Occupational History  . Not on file  Tobacco Use  . Smoking status: Current Every Day Smoker    Packs/day: 0.50    Types: Cigarettes  . Smokeless tobacco: Never Used  . Tobacco comment: smokes 1-2 cig daily  Vaping Use  . Vaping Use: Former  Substance and Sexual Activity  . Alcohol use: No  .  Drug use: No  . Sexual activity: Not Currently    Birth control/protection: None  Other Topics Concern  . Not on file  Social History Narrative  . Not on file   Social Determinants of Health   Financial Resource Strain:   . Difficulty of Paying Living Expenses:   Food Insecurity:   . Worried About Programme researcher, broadcasting/film/video in the Last Year:   . Barista in the Last Year:   Transportation Needs:   . Freight forwarder (Medical):   Marland Kitchen Lack of Transportation (Non-Medical):   Physical Activity:   . Days of Exercise per Week:   . Minutes of Exercise per Session:   Stress:   . Feeling of Stress :   Social Connections:   . Frequency of Communication with Friends and  Family:   . Frequency of Social Gatherings with Friends and Family:   . Attends Religious Services:   . Active Member of Clubs or Organizations:   . Attends Banker Meetings:   Marland Kitchen Marital Status:   Intimate Partner Violence:   . Fear of Current or Ex-Partner:   . Emotionally Abused:   Marland Kitchen Physically Abused:   . Sexually Abused:     Review of Systems: Gen: Denies any fever, chills, cold or flulike symptoms, presyncope, syncope.  Admits to some weakness due to not eating.   CV: Denies chest pain or heart palpitations Resp: Denies shortness of breath or cough.  GI: See HPI GU : Denies urinary burning, urinary frequency, urinary hesitancy.  Urine is little darker in color due to decreased oral intake. MS: Admits to back pain.  Derm: Denies rash Psych: Denies depression or anxiety Heme: Denies bruising or bleeding  Physical Exam: BP (!) 136/91   Pulse (!) 103   Temp 97.8 F (36.6 C)   Ht 5\' 1"  (1.549 m)   Wt 186 lb (84.4 kg)   BMI 35.14 kg/m  General:   Alert and oriented. Pleasant and cooperative. Well-nourished and well-developed.  Head:  Normocephalic and atraumatic. Eyes:  Without icterus, sclera clear and conjunctiva pink.  Ears:  Normal auditory acuity. Lungs:  Clear to auscultation bilaterally. No wheezes, rales, or rhonchi. No distress.  Heart:  S1, S2 present without murmurs appreciated.  Abdomen:  +BS, soft, non-tender and non-distended. No HSM noted. No guarding or rebound. No masses appreciated.  Rectal:  Deferred  Msk:  Symmetrical without gross deformities. Normal posture. Extremities:  Without edema. Skin:  Intact without significant lesions or rashes. Psych:  Normal mood and affect.

## 2019-08-17 NOTE — H&P (View-Only) (Signed)
 Referring Provider: Browning, Douglas, MD Primary Care Physician:  [facility] Primary Gastroenterologist: Pending Dr. Carver  Chief Complaint  Patient presents with  . Nausea    no vomiting, no appetite  . ruq pain    comes/goes, tenderness   HPI:   Kimberly Norris is a 24 y.o. female presenting today at the request of Browning, Douglas, MD for RUQ pain and fatty liver.  RUQ ultrasound 05/19/2019: No gallstones or wall thickening.  No sonographic Murphy sign.  CBD 2-3 mm.  Relatively increased echogenicity of liver parenchyma. Labs in February 2021 with LFTs within normal limits.  Today:  Mild tenderness in the right side of her abdomen. Started in October 2020. Feels like there is a bulge. Doesn't cause any pain, she just notices it if she presses on it. No RUQ pain when eating. Has nausea daily. No vomiting. Nausea worsens with eating.  Reports weight loss.  States she used to be 200 pounds.  Unable to eat much.  States watermelon is the only thing that does not make her nauseated.  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  Has heartburn/reflux daily and early satiety. Doesn't take anything for this. No dysphagia. Eating some spicy foods when she has an appetite. Drinks 1-2 soda daily.   BMs daily. No constipation or diarrhea. No blood in the stool or black stool.   Advil last week after dental work. BC powders for headaches maybe twice a month.  Previously had been prescribed Phenergan which made nausea worse.  Past Medical History:  Diagnosis Date  . Bilateral chronic knee pain 04/16/2012  . Heartburn   . Mental disorder    anxiety, depression  . Rhabdomyolysis     No past surgical history on file.  Current Outpatient Medications  Medication Sig Dispense Refill  . etonogestrel (NEXPLANON) 68 MG IMPL implant  Inject 68 mg into the skin once.     . calcium carbonate (TUMS - DOSED IN MG ELEMENTAL CALCIUM) 500 MG chewable tablet Chew 1 tablet by mouth 2 (two) times daily as needed for indigestion or heartburn.    . ondansetron (ZOFRAN) 4 MG tablet Take 1 tablet (4 mg total) by mouth every 8 (eight) hours as needed for nausea or vomiting. 30 tablet 1  . pantoprazole (PROTONIX) 40 MG tablet Take 1 tablet (40 mg total) by mouth daily. 90 tablet 3   No current facility-administered medications for this visit.    Allergies as of 08/18/2019 - Review Complete 08/18/2019  Allergen Reaction Noted  . Flagyl [metronidazole] Rash 03/06/2019    Family History  Problem Relation Age of Onset  . Autism Sister        1 sister    Social History   Socioeconomic History  . Marital status: Single    Spouse name: Not on file  . Number of children: Not on file  . Years of education: Not on file  . Highest education level: Not on file  Occupational History  . Not on file  Tobacco Use  . Smoking status: Current Every Day Smoker    Packs/day: 0.50    Types: Cigarettes  . Smokeless tobacco: Never Used  . Tobacco comment: smokes 1-2 cig daily  Vaping Use  . Vaping Use: Former  Substance and Sexual Activity  . Alcohol use: No  .   Drug use: No  . Sexual activity: Not Currently    Birth control/protection: None  Other Topics Concern  . Not on file  Social History Narrative  . Not on file   Social Determinants of Health   Financial Resource Strain:   . Difficulty of Paying Living Expenses:   Food Insecurity:   . Worried About Programme researcher, broadcasting/film/video in the Last Year:   . Barista in the Last Year:   Transportation Needs:   . Freight forwarder (Medical):   Marland Kitchen Lack of Transportation (Non-Medical):   Physical Activity:   . Days of Exercise per Week:   . Minutes of Exercise per Session:   Stress:   . Feeling of Stress :   Social Connections:   . Frequency of Communication with Friends and  Family:   . Frequency of Social Gatherings with Friends and Family:   . Attends Religious Services:   . Active Member of Clubs or Organizations:   . Attends Banker Meetings:   Marland Kitchen Marital Status:   Intimate Partner Violence:   . Fear of Current or Ex-Partner:   . Emotionally Abused:   Marland Kitchen Physically Abused:   . Sexually Abused:     Review of Systems: Gen: Denies any fever, chills, cold or flulike symptoms, presyncope, syncope.  Admits to some weakness due to not eating.   CV: Denies chest pain or heart palpitations Resp: Denies shortness of breath or cough.  GI: See HPI GU : Denies urinary burning, urinary frequency, urinary hesitancy.  Urine is little darker in color due to decreased oral intake. MS: Admits to back pain.  Derm: Denies rash Psych: Denies depression or anxiety Heme: Denies bruising or bleeding  Physical Exam: BP (!) 136/91   Pulse (!) 103   Temp 97.8 F (36.6 C)   Ht 5\' 1"  (1.549 m)   Wt 186 lb (84.4 kg)   BMI 35.14 kg/m  General:   Alert and oriented. Pleasant and cooperative. Well-nourished and well-developed.  Head:  Normocephalic and atraumatic. Eyes:  Without icterus, sclera clear and conjunctiva pink.  Ears:  Normal auditory acuity. Lungs:  Clear to auscultation bilaterally. No wheezes, rales, or rhonchi. No distress.  Heart:  S1, S2 present without murmurs appreciated.  Abdomen:  +BS, soft, non-tender and non-distended. No HSM noted. No guarding or rebound. No masses appreciated.  Rectal:  Deferred  Msk:  Symmetrical without gross deformities. Normal posture. Extremities:  Without edema. Skin:  Intact without significant lesions or rashes. Psych:  Normal mood and affect.

## 2019-08-18 ENCOUNTER — Encounter: Payer: Self-pay | Admitting: *Deleted

## 2019-08-18 ENCOUNTER — Encounter: Payer: Self-pay | Admitting: Gastroenterology

## 2019-08-18 ENCOUNTER — Other Ambulatory Visit: Payer: Self-pay

## 2019-08-18 ENCOUNTER — Ambulatory Visit: Payer: Medicaid Other | Admitting: Gastroenterology

## 2019-08-18 ENCOUNTER — Telehealth: Payer: Self-pay | Admitting: *Deleted

## 2019-08-18 VITALS — BP 136/91 | HR 103 | Temp 97.8°F | Ht 61.0 in | Wt 186.0 lb

## 2019-08-18 DIAGNOSIS — K219 Gastro-esophageal reflux disease without esophagitis: Secondary | ICD-10-CM | POA: Insufficient documentation

## 2019-08-18 DIAGNOSIS — R634 Abnormal weight loss: Secondary | ICD-10-CM | POA: Insufficient documentation

## 2019-08-18 DIAGNOSIS — K760 Fatty (change of) liver, not elsewhere classified: Secondary | ICD-10-CM | POA: Diagnosis not present

## 2019-08-18 DIAGNOSIS — R110 Nausea: Secondary | ICD-10-CM

## 2019-08-18 DIAGNOSIS — R6881 Early satiety: Secondary | ICD-10-CM

## 2019-08-18 DIAGNOSIS — R112 Nausea with vomiting, unspecified: Secondary | ICD-10-CM | POA: Insufficient documentation

## 2019-08-18 MED ORDER — PANTOPRAZOLE SODIUM 40 MG PO TBEC: 40.0000 mg | DELAYED_RELEASE_TABLET | Freq: Every day | ORAL | 3 refills | Status: DC

## 2019-08-18 MED ORDER — ONDANSETRON HCL 4 MG PO TABS: 4.0000 mg | ORAL_TABLET | Freq: Three times a day (TID) | ORAL | 1 refills | Status: DC | PRN

## 2019-08-18 NOTE — Patient Instructions (Addendum)
We will get you scheduled for an upper endoscopy with Dr. Marletta Lor in the near future.  Please have labs completed at [facility] lab.  Please start Protonix 40 mg daily 30 minutes before breakfast.   Also sending in Zofran 4 mg.  You may take this every 8 hours as needed for nausea.  Follow a GERD diet/lifestyle:  Avoid fried, fatty, greasy, spicy, citrus foods. Avoid caffeine and carbonated beverages. Avoid chocolate. Try eating 4-6 small meals a day rather than 3 large meals. Do not eat within 3 hours of laying down. Prop head of bed up on wood or bricks to create a 6 inch incline.  Instructions for fatty liver: Recommend 1-2# weight loss per week until ideal body weight through exercise & diet. Low fat/cholesterol diet.   Avoid sweets, sodas, fruit juices, sweetened beverages like tea, etc. Gradually increase exercise from 15 min daily up to 1 hr per day 5 days/week. Limit alcohol use.  We will plan to follow-up with you in the office after your upper endoscopy.  Do not hesitate to call with questions or concerns prior.  Ermalinda Memos, PA-C [facility]    Food Choices for Gastroesophageal Reflux Disease, Adult When you have gastroesophageal reflux disease (GERD), the foods you eat and your eating habits are very important. Choosing the right foods can help ease the discomfort of GERD. Consider working with a diet and nutrition specialist (dietitian) to help you make healthy food choices. What general guidelines should I follow?  Eating plan  Choose healthy foods low in fat, such as fruits, vegetables, whole grains, low-fat dairy products, and lean meat, fish, and poultry.  Eat frequent, small meals instead of three large meals each day. Eat your meals slowly, in a relaxed setting. Avoid bending over or lying down until 2-3 hours after eating.  Limit high-fat foods such as fatty meats or fried foods.  Limit your intake of oils, butter, and shortening to less than  8 teaspoons each day.  Avoid the following: ? Foods that cause symptoms. These may be different for different people. Keep a food diary to keep track of foods that cause symptoms. ? Alcohol. ? Drinking large amounts of liquid with meals. ? Eating meals during the 2-3 hours before bed.  Cook foods using methods other than frying. This may include baking, grilling, or broiling. Lifestyle  Maintain a healthy weight. Ask your health care provider what weight is healthy for you. If you need to lose weight, work with your health care provider to do so safely.  Exercise for at least 30 minutes on 5 or more days each week, or as told by your health care provider.  Avoid wearing clothes that fit tightly around your waist and chest.  Do not use any products that contain nicotine or tobacco, such as cigarettes and e-cigarettes. If you need help quitting, ask your health care provider.  Sleep with the head of your bed raised. Use a wedge under the mattress or blocks under the bed frame to raise the head of the bed. What foods are not recommended? The items listed may not be a complete list. Talk with your dietitian about what dietary choices are best for you. Grains Pastries or quick breads with added fat. [place] toast. Vegetables Deep fried vegetables. [place] fries. Any vegetables prepared with added fat. Any vegetables that cause symptoms. For some people this may include tomatoes and tomato products, chili peppers, onions and garlic, and horseradish. Fruits Any fruits prepared with added fat.  Any fruits that cause symptoms. For some people this may include citrus fruits, such as oranges, grapefruit, pineapple, and lemons. Meats and other protein foods High-fat meats, such as fatty beef or pork, hot dogs, ribs, ham, sausage, salami and bacon. Fried meat or protein, including fried fish and fried chicken. Nuts and nut butters. Dairy Whole milk and chocolate milk. Sour cream. Cream. Ice cream.  Cream cheese. Milk shakes. Beverages Coffee and tea, with or without caffeine. Carbonated beverages. Sodas. Energy drinks. Fruit juice made with acidic fruits (such as orange or grapefruit). Tomato juice. Alcoholic drinks. Fats and oils Butter. Margarine. Shortening. Ghee. Sweets and desserts Chocolate and cocoa. Donuts. Seasoning and other foods Pepper. Peppermint and spearmint. Any condiments, herbs, or seasonings that cause symptoms. For some people, this may include curry, hot sauce, or vinegar-based salad dressings. Summary  When you have gastroesophageal reflux disease (GERD), food and lifestyle choices are very important to help ease the discomfort of GERD.  Eat frequent, small meals instead of three large meals each day. Eat your meals slowly, in a relaxed setting. Avoid bending over or lying down until 2-3 hours after eating.  Limit high-fat foods such as fatty meat or fried foods. This information is not intended to replace advice given to you by your health care provider. Make sure you discuss any questions you have with your health care provider. Document Revised: 04/25/2018 Document Reviewed: 01/04/2016 Elsevier Patient Education  2020 Elsevier Inc.  Nonalcoholic Fatty Liver Disease Diet, Adult Nonalcoholic fatty liver disease is a condition that causes fat to build up in and around the liver. The disease makes it harder for the liver to work the way that it should. Following a healthy diet can help to keep nonalcoholic fatty liver disease under control. It can also help to prevent or improve conditions that are associated with the disease, such as heart disease, diabetes, high blood pressure, and abnormal cholesterol levels. Along with regular exercise, this diet:  Promotes weight loss.  Helps to control blood sugar levels.  Helps to improve the way that the body uses insulin. What are tips for following this plan? Reading food labels Always check food labels for:  The  amount of saturated fat in a food. You should limit your intake of saturated fat. Saturated fat is found in foods that come from animals, including meat and dairy products such as butter, cheese, and whole milk.  The amount of fiber in a food. You should choose high-fiber foods such as fruits, vegetables, and whole grains. Try to get 25-30 grams (g) of fiber a day.  Cooking  When cooking, use heart-healthy oils that are high in monounsaturated fats. These include olive oil, canola oil, and avocado oil.  Limit frying or deep-frying foods. Cook foods using healthy methods such as baking, boiling, steaming, and grilling instead. Meal planning  You may want to keep track of how many calories you take in. Eating the right amount of calories will help you achieve a healthy weight. Meeting with a registered dietitian can help you get started.  Limit how often you eat takeout and fast food. These foods are usually very high in fat, salt, and sugar.  Use the glycemic index (GI) to plan your meals. The index tells you how quickly a food will raise your blood sugar. Choose low-GI foods (GI less than 55). These foods take a longer time to raise blood sugar. A registered dietitian can help you identify foods lower on the GI scale. Lifestyle  You may want to follow a Mediterranean diet. This diet includes a lot of vegetables, lean meats or fish, whole grains, fruits, and healthy oils and fats. What foods can I eat?  Fruits Bananas. Apples. Oranges. Grapes. Papaya. Mango. Pomegranate. Kiwi. Grapefruit. Cherries. Vegetables Lettuce. Spinach. Peas. Beets. Cauliflower. Cabbage. Broccoli. Carrots. Tomatoes. Squash. Eggplant. Herbs. Peppers. Onions. Cucumbers. Brussels sprouts. Yams and sweet potatoes. Beans. Lentils. Grains Whole wheat or whole-grain foods, including breads, crackers, cereals, and pasta. Stone-ground whole wheat. Unsweetened oatmeal. Bulgur. Barley. Quinoa. Brown or wild rice. Corn or whole  wheat flour tortillas. Meats and other proteins Lean meats. Poultry. Tofu. Seafood and shellfish. Dairy Low-fat or fat-free dairy products, such as yogurt, cottage cheese, or cheese. Beverages Water. Sugar-free drinks. Tea. Coffee. Low-fat or skim milk. Milk alternatives, such as soy or almond milk. Real fruit juice. Fats and oils Avocado. Canola or olive oil. Nuts and nut butters. Seeds. Seasonings and condiments Mustard. Relish. Low-fat, low-sugar ketchup and barbecue sauce. Low-fat or fat-free mayonnaise. Sweets and desserts Sugar-free sweets. The items listed above may not be a complete list of foods and beverages you can eat. Contact a dietitian for more information. What foods should I limit or avoid? Meats and other proteins Limit red meat to 1-2 times a week. Dairy [employer]. Fats and oils Palm oil and coconut oil. Fried foods. Other foods Processed foods. Foods that contain a lot of salt or sodium. Sweets and desserts Sweets that contain sugar. Beverages Sweetened drinks, such as sweet tea, milkshakes, iced sweet drinks, and sodas. Alcohol. The items listed above may not be a complete list of foods and beverages you should avoid. Contact a dietitian for more information. Where to find more information The [employer] of Diabetes and Digestive and Kidney Diseases: [email or web address] Summary  Nonalcoholic fatty liver disease is a condition that causes fat to build up in and around the liver.  Following a healthy diet can help to keep nonalcoholic fatty liver disease under control. Your diet should be rich in fruits, vegetables, whole grains, and lean proteins.  Limit your intake of saturated fat. Saturated fat is found in foods that come from animals, including meat and dairy products such as butter, cheese, and whole milk.  This diet promotes weight loss, helps to control blood sugar levels, and helps to improve the way that the body uses insulin. This  information is not intended to replace advice given to you by your health care provider. Make sure you discuss any questions you have with your health care provider. Document Revised: 04/26/2018 Document Reviewed: 01/24/2018 Elsevier Patient Education  2020 [employer].

## 2019-08-18 NOTE — Telephone Encounter (Signed)
PA submitted to [employer]. PA pending review. Tracking # [id number]

## 2019-08-19 LAB — CBC WITH DIFFERENTIAL/PLATELET
Eosinophils Relative: 2.6 %
WBC: 9.1 10*3/uL (ref 3.8–10.8)

## 2019-08-20 LAB — COMPLETE METABOLIC PANEL WITH GFR
AG Ratio: 1.6 (calc) (ref 1.0–2.5)
ALT: 19 U/L (ref 6–29)
AST: 15 U/L (ref 10–30)
Albumin: 4.4 g/dL (ref 3.6–5.1)
Alkaline phosphatase (APISO): 64 U/L (ref 31–125)
BUN: 8 mg/dL (ref 7–25)
CO2: 25 mmol/L (ref 20–32)
Calcium: 9.8 mg/dL (ref 8.6–10.2)
Chloride: 105 mmol/L (ref 98–110)
Creat: 0.73 mg/dL (ref 0.50–1.10)
GFR, Est African American: 135 mL/min/{1.73_m2} (ref >=60)
GFR, Est Non African American: 117 mL/min/{1.73_m2} (ref >=60)
Globulin: 2.7 g/dL (calc) (ref 1.9–3.7)
Glucose, Bld: 94 mg/dL (ref 65–139)
Potassium: 4.5 mmol/L (ref 3.5–5.3)
Sodium: 138 mmol/L (ref 135–146)
Total Bilirubin: 0.6 mg/dL (ref 0.2–1.2)
Total Protein: 7.1 g/dL (ref 6.1–8.1)

## 2019-08-20 LAB — CBC WITH DIFFERENTIAL/PLATELET
Absolute Monocytes: 501 cells/uL (ref 200–950)
Basophils Absolute: 46 cells/uL (ref 0–200)
Basophils Relative: 0.5 %
Eosinophils Absolute: 237 cells/uL (ref 15–500)
HCT: 42.5 % (ref 35.0–45.0)
Hemoglobin: 13.9 g/dL (ref 11.7–15.5)
Lymphs Abs: 1966 cells/uL (ref 850–3900)
MCH: 31.8 pg (ref 27.0–33.0)
MCHC: 32.7 g/dL (ref 32.0–36.0)
MCV: 97.3 fL (ref 80.0–100.0)
MPV: 10.6 fL (ref 7.5–12.5)
Monocytes Relative: 5.5 %
Neutro Abs: 6352 cells/uL (ref 1500–7800)
Neutrophils Relative %: 69.8 %
Platelets: 367 10*3/uL (ref 140–400)
RBC: 4.37 10*6/uL (ref 3.80–5.10)
RDW: 11.3 % (ref 11.0–15.0)
Total Lymphocyte: 21.6 %

## 2019-08-20 NOTE — Progress Notes (Signed)
Hemoglobin, WBC count, platelets, kidney function, electrolytes, and liver function tests all within normal limits.

## 2019-08-22 ENCOUNTER — Encounter (HOSPITAL_COMMUNITY)
Admission: RE | Admit: 2019-08-22 | Discharge: 2019-08-22 | Disposition: A | Discharge status: Home / Self Care | Payer: Medicaid Other | Source: Ambulatory Visit | Attending: Internal Medicine | Admitting: Internal Medicine

## 2019-08-22 ENCOUNTER — Other Ambulatory Visit: Payer: Self-pay

## 2019-08-22 ENCOUNTER — Encounter (HOSPITAL_COMMUNITY): Payer: Self-pay

## 2019-08-23 NOTE — Assessment & Plan Note (Addendum)
24 y.o. female with daily nausea without vomiting, early satiety, daily GERD symptoms, and report of mild right upper abdominal tenderness palpation since October 2020 with associated weight loss.  Documented 14 pound weight loss in the last 9 months.  Reports only being able to eat watermelon.  Occasional NSAID use with BC powders twice a month for headaches.  Denies BRBPR or melena.  Abdominal exam is benign today.  Recent RUQ ultrasound 05/19/2019 with gallbladder and CBD within normal limits.  She did have evidence of fatty liver.  Labs in February 2021 with LFTs within normal limits.  Symptoms may very well be secondary to uncontrolled GERD, gastritis, esophagitis, duodenitis.  However, with reported early satiety and weight loss, will pursue EGD to ensure she has no evidence of gastric outlet obstruction.  Biopsies can also be taken at that time to evaluate for H. pylori.  Additional considerations include biliary dyskinesia and gastroparesis.  Plan: Update CBC and CMP. Start Protonix 40 mg daily 30 minutes before breakfast. Counseled on GERD diet/lifestyle.  Handout provided. Zofran 4 mg every 8 hours as needed for nausea. Proceed with EGD with propofol with Dr. Marletta Lor in the near future. The risks, benefits, and alternatives have been discussed in detail with patient. They have stated understanding and desire to proceed.  ASA II Follow-up after EGD.  Consider HIDA scan if no significant findings on EGD and symptoms persist despite PPI.

## 2019-08-23 NOTE — Assessment & Plan Note (Signed)
Fatty liver noted on RUQ ultrasound 05/19/2019.  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   Fatty liver handout provided. Instructions for fatty liver:  Recommend 1-2# weight loss per week until ideal body weight through exercise & diet.  Low fat/cholesterol diet.    Avoid sweets, sodas, fruit juices, sweetened beverages like tea, etc.  Gradually increase exercise from 15 min daily up to 1 hr per day 5 days/week.  Limit alcohol use.

## 2019-08-23 NOTE — Assessment & Plan Note (Signed)
Addressed under nausea without vomiting. 

## 2019-08-23 NOTE — Assessment & Plan Note (Addendum)
24 year old female reporting daily heartburn/reflux symptoms with also currently struggling with early satiety and daily nausea without vomiting with associated weight loss.  Also notes some right upper abdominal tenderness but her abdominal exam is completely benign today.  No BRBPR or melena.  RUQ ultrasound 05/19/19 with gallbladder and CBD within normal limits.  Plan: Start Protonix 40 mg daily 30 minutes before breakfast. Counseled on GERD diet/lifestyle.  Handout provided. Proceed with EGD with propofol Dr. Marletta Lor in the near future for further evaluation daily nausea without vomiting, early satiety, and weight loss as per below. ASA II Follow-up after procedure.

## 2019-08-26 NOTE — Telephone Encounter (Signed)
PA for procedure still pending via [employer] 

## 2019-08-27 NOTE — Telephone Encounter (Signed)
PA approved.auth# [id number] dates 09/04/19-10/04/19

## 2019-08-28 ENCOUNTER — Ambulatory Visit: Payer: Medicaid Other | Admitting: Adult Health

## 2019-09-01 ENCOUNTER — Telehealth: Payer: Self-pay | Admitting: Internal Medicine

## 2019-09-01 NOTE — Telephone Encounter (Signed)
Pt called to ask if there is a test to see if she would have a reaction to anesthesia and she also wants to reschedule her procedure with Dr Marletta Lor on 09/04/2019  [phone]

## 2019-09-01 NOTE — Telephone Encounter (Signed)
Tried to call pt, no answer, LMOVM for return call.  

## 2019-09-02 ENCOUNTER — Telehealth: Payer: Self-pay | Admitting: Internal Medicine

## 2019-09-02 NOTE — Telephone Encounter (Signed)
Pt returning call [phone]

## 2019-09-02 NOTE — Telephone Encounter (Signed)
Pt called office, tried to call her back, no answer. LMOVM for return call.

## 2019-09-02 NOTE — Telephone Encounter (Signed)
See other phone note

## 2019-09-02 NOTE — Telephone Encounter (Signed)
Pt returned call and LMOVM. Tried to call pt back, no answer, LMOVM for return call.

## 2019-09-02 NOTE — Telephone Encounter (Signed)
Spoke to pt, informed her there isn't a test to see if she would have a reaction to anesthesia. She wants to reschedule EGD d/t another appt 09/04/19. EGD rescheduled to 09/11/19 at 8:00am. COVID test rescheduled to 09/10/19 at 10:15am. Appt letter mailed with new procedure instructions. She has MyChart also. LMOVM for endo scheduler.

## 2019-09-03 ENCOUNTER — Other Ambulatory Visit (HOSPITAL_COMMUNITY): Admission: RE | Admit: 2019-09-03 | Payer: Medicaid Other | Source: Ambulatory Visit

## 2019-09-04 ENCOUNTER — Other Ambulatory Visit (HOSPITAL_COMMUNITY): Payer: Medicaid Other

## 2019-09-09 ENCOUNTER — Ambulatory Visit: Payer: Medicaid Other | Admitting: Adult Health

## 2019-09-10 ENCOUNTER — Other Ambulatory Visit: Payer: Self-pay

## 2019-09-10 ENCOUNTER — Other Ambulatory Visit (HOSPITAL_COMMUNITY)
Admission: RE | Admit: 2019-09-10 | Discharge: 2019-09-10 | Disposition: A | Discharge status: Home / Self Care | Payer: Medicaid Other | Source: Ambulatory Visit | Attending: Internal Medicine | Admitting: Internal Medicine

## 2019-09-10 DIAGNOSIS — Z20822 Contact with and (suspected) exposure to covid-19: Secondary | ICD-10-CM | POA: Insufficient documentation

## 2019-09-10 DIAGNOSIS — Z01812 Encounter for preprocedural laboratory examination: Secondary | ICD-10-CM | POA: Diagnosis present

## 2019-09-10 LAB — SARS CORONAVIRUS 2 (TAT 6-24 HRS): SARS Coronavirus 2: NEGATIVE

## 2019-09-10 LAB — PREGNANCY, URINE: Preg Test, Ur: NEGATIVE

## 2019-09-11 ENCOUNTER — Encounter (HOSPITAL_COMMUNITY): Payer: Self-pay

## 2019-09-11 ENCOUNTER — Ambulatory Visit (HOSPITAL_COMMUNITY): Payer: Medicaid Other | Admitting: Certified Registered"

## 2019-09-11 ENCOUNTER — Ambulatory Visit (HOSPITAL_COMMUNITY)
Admission: RE | Admit: 2019-09-11 | Discharge: 2019-09-11 | Disposition: A | Discharge status: Home / Self Care | Payer: Medicaid Other | Attending: Internal Medicine | Admitting: Internal Medicine

## 2019-09-11 ENCOUNTER — Encounter (HOSPITAL_COMMUNITY)
Admission: RE | Disposition: A | Discharge status: Home / Self Care | Payer: Self-pay | Source: Home / Self Care | Attending: Internal Medicine

## 2019-09-11 DIAGNOSIS — R12 Heartburn: Secondary | ICD-10-CM

## 2019-09-11 DIAGNOSIS — K219 Gastro-esophageal reflux disease without esophagitis: Secondary | ICD-10-CM | POA: Insufficient documentation

## 2019-09-11 DIAGNOSIS — Z79899 Other long term (current) drug therapy: Secondary | ICD-10-CM | POA: Diagnosis not present

## 2019-09-11 DIAGNOSIS — F17210 Nicotine dependence, cigarettes, uncomplicated: Secondary | ICD-10-CM | POA: Diagnosis not present

## 2019-09-11 DIAGNOSIS — K2970 Gastritis, unspecified, without bleeding: Secondary | ICD-10-CM | POA: Diagnosis not present

## 2019-09-11 DIAGNOSIS — K228 Other specified diseases of esophagus: Secondary | ICD-10-CM | POA: Diagnosis not present

## 2019-09-11 HISTORY — PX: BIOPSY: SHX5522

## 2019-09-11 HISTORY — PX: ESOPHAGOGASTRODUODENOSCOPY (EGD) WITH PROPOFOL: SHX5813

## 2019-09-11 SURGERY — ESOPHAGOGASTRODUODENOSCOPY (EGD) WITH PROPOFOL
Anesthesia: General

## 2019-09-11 MED ORDER — LIDOCAINE VISCOUS HCL 2 % MT SOLN
15.0000 mL | Freq: Once | OROMUCOSAL | Status: AC
  Administered 2019-09-11: 15 mL via OROMUCOSAL

## 2019-09-11 MED ORDER — LACTATED RINGERS IV SOLN: INTRAVENOUS | Status: DC

## 2019-09-11 MED ORDER — STERILE WATER FOR IRRIGATION IR SOLN
Status: DC | PRN
  Administered 2019-09-11: 1.5 mL

## 2019-09-11 MED ORDER — PROPOFOL 10 MG/ML IV BOLUS
INTRAVENOUS | Status: DC | PRN
  Administered 2019-09-11: 80 mg via INTRAVENOUS
  Administered 2019-09-11: 50 mg via INTRAVENOUS
  Administered 2019-09-11: 100 mg via INTRAVENOUS
  Administered 2019-09-11: 50 mg via INTRAVENOUS
  Administered 2019-09-11: 60 mg via INTRAVENOUS

## 2019-09-11 MED ORDER — CHLORHEXIDINE GLUCONATE CLOTH 2 % EX PADS: 6.0000 | MEDICATED_PAD | Freq: Once | CUTANEOUS | Status: DC

## 2019-09-11 MED ORDER — LIDOCAINE VISCOUS HCL 2 % MT SOLN
OROMUCOSAL | Status: AC
  Filled 2019-09-11: qty 15

## 2019-09-11 MED ORDER — GLYCOPYRROLATE 0.2 MG/ML IJ SOLN
INTRAMUSCULAR | Status: AC
  Filled 2019-09-11: qty 1

## 2019-09-11 MED ORDER — LIDOCAINE HCL (CARDIAC) PF 50 MG/5ML IV SOSY
PREFILLED_SYRINGE | INTRAVENOUS | Status: DC | PRN
  Administered 2019-09-11: 60 mg via INTRAVENOUS

## 2019-09-11 MED ORDER — GLYCOPYRROLATE 0.2 MG/ML IJ SOLN
0.2000 mg | Freq: Once | INTRAMUSCULAR | Status: AC
  Administered 2019-09-11: 0.2 mg via INTRAVENOUS

## 2019-09-11 NOTE — Op Note (Signed)
[facility] Patient Name: Kimberly Norris Procedure Date: 09/11/2019 7:51 AM MRN: [id number] Date of Birth: February 21, 1995 Attending MD: Elon Alas. Abbey Chatters DO CSN: [id number] Age: 24 Admit Type: Outpatient Procedure:                Upper GI endoscopy Indications:              Epigastric abdominal pain, Heartburn, Weight loss Providers:                Elon Alas. Abbey Chatters, DO, Janeece Riggers, RN, Lambert Mody, Casimer Bilis, Technician, Nelma Rothman, Technician Referring MD:              Medicines:                See the Anesthesia note for documentation of the                            administered medications Complications:            No immediate complications. Estimated Blood Loss:     Estimated blood loss was minimal. Procedure:                Pre-Anesthesia Assessment:                           - The anesthesia plan was to use monitored                            anesthesia care (MAC).                           After obtaining informed consent, the endoscope was                            passed under direct vision. Throughout the                            procedure, the patient's blood pressure, pulse, and                            oxygen saturations were monitored continuously. The                            [id number]-[id number] ([id number]) scope was introduced through the                            mouth, and advanced to the second part of duodenum.                            The upper GI endoscopy was accomplished without                            difficulty. The  patient tolerated the procedure                            well. Scope In: 7:56:04 AM Scope Out: 7:59:52 AM Total Procedure Duration: 0 hours 3 minutes 48 seconds  Findings:      Mucosal changes including longitudinal furrows were found in the middle       third of the esophagus. Biopsies were taken with a cold forceps for       histology.      Diffuse mild  inflammation characterized by erythema was found in the       entire examined stomach. Biopsies were taken with a cold forceps for       Helicobacter pylori testing.      The duodenal bulb, first portion of the duodenum and second portion of       the duodenum were normal. Biopsies for histology were taken with a cold       forceps for evaluation of celiac disease. Impression:               - Esophageal mucosal changes suspicious for                            eosinophilic esophagitis. Biopsied.                           - Gastritis. Biopsied.                           - Normal duodenal bulb, first portion of the                            duodenum and second portion of the duodenum.                            Biopsied. Moderate Sedation:      Per Anesthesia Care Recommendation:           - Patient has a contact number available for                            emergencies. The signs and symptoms of potential                            delayed complications were discussed with the                            patient. Return to normal activities tomorrow.                            Written discharge instructions were provided to the                            patient.                           - Resume previous diet.                           -  Use Protonix (pantoprazole) 40 mg PO daily.                           - Await pathology results.                           - Return to GI clinic in 8 weeks with [facility] or as                            previously scheduled. Procedure Code(s):        --- Professional ---                           [phone], Esophagogastroduodenoscopy, flexible,                            transoral; with biopsy, single or multiple Diagnosis Code(s):        --- Professional ---                           K22.8, Other specified diseases of esophagus                           K29.70, Gastritis, unspecified, without bleeding                           R10.13, Epigastric pain                            R12, Heartburn                           R63.4, Abnormal weight loss CPT copyright 2019 American Medical Association. All rights reserved. The codes documented in this report are preliminary and upon coder review may  be revised to meet current compliance requirements. Elon Alas. Abbey Chatters, Delmar Abbey Chatters, DO 09/11/2019 8:04:22 AM This report has been signed electronically. Number of Addenda: 0

## 2019-09-11 NOTE — Anesthesia Postprocedure Evaluation (Signed)
Anesthesia Post Note  Patient: Kimberly Norris  Procedure(s) Performed: ESOPHAGOGASTRODUODENOSCOPY (EGD) WITH PROPOFOL (N/A ) BIOPSY  Patient location during evaluation: Endoscopy Anesthesia Type: General Level of consciousness: awake, oriented, awake and alert and patient cooperative Pain management: pain level controlled Vital Signs Assessment: post-procedure vital signs reviewed and stable Respiratory status: spontaneous breathing, respiratory function stable and nonlabored ventilation Cardiovascular status: blood pressure returned to baseline and stable Postop Assessment: no headache and no backache Anesthetic complications: no   No complications documented.   Last Vitals:  Vitals:   09/11/19 0808 09/11/19 0810  BP: (!) 95/42 (!) 119/59  Pulse: 82 88  Resp: (!) 24 20  Temp: 37 C   SpO2: 97% 97%    Last Pain:  Vitals:   09/11/19 0810  TempSrc:   PainSc: 0-No pain                 Brynda Peon

## 2019-09-11 NOTE — Anesthesia Preprocedure Evaluation (Signed)
Anesthesia Evaluation  Patient identified by MRN, date of birth, ID band Patient awake    Reviewed: Allergy & Precautions, H&P , NPO status , Patient's Chart, lab work & pertinent test results, reviewed documented beta blocker date and time   Airway Mallampati: II  TM Distance: >3 FB Neck ROM: full    Dental no notable dental hx.    Pulmonary neg pulmonary ROS, Current Smoker and Patient abstained from smoking.,    Pulmonary exam normal breath sounds clear to auscultation       Cardiovascular Exercise Tolerance: Good negative cardio ROS   Rhythm:regular Rate:Normal     Neuro/Psych  Neuromuscular disease negative psych ROS   GI/Hepatic Neg liver ROS, GERD  Medicated,  Endo/Other  negative endocrine ROS  Renal/GU negative Renal ROS  negative genitourinary   Musculoskeletal   Abdominal   Peds  Hematology negative hematology ROS (+)   Anesthesia Other Findings   Reproductive/Obstetrics negative OB ROS                             Anesthesia Physical Anesthesia Plan  ASA: II  Anesthesia Plan: General   Post-op Pain Management:    Induction:   PONV Risk Score and Plan: 3 and Propofol infusion  Airway Management Planned:   Additional Equipment:   Intra-op Plan:   Post-operative Plan:   Informed Consent: I have reviewed the patients History and Physical, chart, labs and discussed the procedure including the risks, benefits and alternatives for the proposed anesthesia with the patient or authorized representative who has indicated his/her understanding and acceptance.     Dental Advisory Given  Plan Discussed with: CRNA  Anesthesia Plan Comments:         Anesthesia Quick Evaluation

## 2019-09-11 NOTE — Interval H&P Note (Signed)
History and Physical Interval Note:  09/11/2019 7:37 AM  Kimberly Norris  has presented today for surgery, with the diagnosis of nausea, gerd, weight loss, fatty liver.  The various methods of treatment have been discussed with the patient and family. After consideration of risks, benefits and other options for treatment, the patient has consented to  Procedure(s) with comments: ESOPHAGOGASTRODUODENOSCOPY (EGD) WITH PROPOFOL (N/A) - 11:15am as a surgical intervention.  The patient's history has been reviewed, patient examined, no change in status, stable for surgery.  I have reviewed the patient's chart and labs.  Questions were answered to the patient's satisfaction.     Lanelle Bal

## 2019-09-11 NOTE — Transfer of Care (Addendum)
Immediate Anesthesia Transfer of Care Note  Patient: Kimberly Norris  Procedure(s) Performed: ESOPHAGOGASTRODUODENOSCOPY (EGD) WITH PROPOFOL (N/A ) BIOPSY  Patient Location: Endoscopy Unit  Anesthesia Type:General  Level of Consciousness: awake, alert , oriented and patient cooperative  Airway & Oxygen Therapy: Patient Spontanous Breathing  Post-op Assessment: Report given to RN, Post -op Vital signs reviewed and stable and Patient moving all extremities  Post vital signs: Reviewed and stable  Last Vitals:  Vitals Value Taken Time  BP 119/59 09/11/19 0810  Temp 37 C 09/11/19 0808  Pulse 88 09/11/19 0810  Resp 20 09/11/19 0810  SpO2 97 % 09/11/19 0810    Last Pain:  Vitals:   09/11/19 0810  TempSrc:   PainSc: 0-No pain      Patients Stated Pain Goal: 5 (09/11/19 7893)  Complications: No complications documented.

## 2019-09-11 NOTE — Discharge Instructions (Signed)
EGD Discharge instructions Please read the instructions outlined below and refer to this sheet in the next few weeks. These discharge instructions provide you with general information on caring for yourself after you leave the hospital. Your doctor may also give you specific instructions. While your treatment has been planned according to the most current medical practices available, unavoidable complications occasionally occur. If you have any problems or questions after discharge, please call your doctor. ACTIVITY  You may resume your regular activity but move at a slower pace for the next 24 hours.   Take frequent rest periods for the next 24 hours.   Walking will help expel (get rid of) the air and reduce the bloated feeling in your abdomen.   No driving for 24 hours (because of the anesthesia (medicine) used during the test).   You may shower.   Do not sign any important legal documents or operate any machinery for 24 hours (because of the anesthesia used during the test).  NUTRITION  Drink plenty of fluids.   You may resume your normal diet.   Begin with a light meal and progress to your normal diet.   Avoid alcoholic beverages for 24 hours or as instructed by your caregiver.  MEDICATIONS  You may resume your normal medications unless your caregiver tells you otherwise.  WHAT YOU CAN EXPECT TODAY  You may experience abdominal discomfort such as a feeling of fullness or "gas" pains.  FOLLOW-UP  Your doctor will discuss the results of your test with you.  SEEK IMMEDIATE MEDICAL ATTENTION IF ANY OF THE FOLLOWING OCCUR:  Excessive nausea (feeling sick to your stomach) and/or vomiting.   Severe abdominal pain and distention (swelling).   Trouble swallowing.   Temperature over 101 F (37.8 C).   Rectal bleeding or vomiting of blood.   Your EGD showed mild amount of inflammation in your stomach.  I biopsied this to rule out bacterial infection with H. pylori.  I also  took small bowel biopsies to rule out celiac disease.  Please await your pathology results (my office will contact you).  Follow-up with GI in 8 weeks or as previously scheduled.   I hope you have a great rest of your week!  Hennie Duos. Marletta Lor, D.O. Gastroenterology and Hepatology [facility]

## 2019-09-12 ENCOUNTER — Other Ambulatory Visit: Payer: Self-pay

## 2019-09-12 LAB — SURGICAL PATHOLOGY

## 2019-09-15 ENCOUNTER — Encounter (HOSPITAL_COMMUNITY): Payer: Self-pay | Admitting: Internal Medicine

## 2019-09-16 ENCOUNTER — Encounter: Payer: Self-pay | Admitting: Adult Health

## 2019-09-16 ENCOUNTER — Ambulatory Visit (INDEPENDENT_AMBULATORY_CARE_PROVIDER_SITE_OTHER): Payer: Medicaid Other | Admitting: Adult Health

## 2019-09-16 ENCOUNTER — Other Ambulatory Visit (HOSPITAL_COMMUNITY)
Admission: RE | Admit: 2019-09-16 | Discharge: 2019-09-16 | Disposition: A | Discharge status: Home / Self Care | Payer: Medicaid Other | Source: Ambulatory Visit | Attending: Adult Health | Admitting: Adult Health

## 2019-09-16 VITALS — BP 125/81 | HR 93 | Ht 61.0 in | Wt 187.0 lb

## 2019-09-16 DIAGNOSIS — R350 Frequency of micturition: Secondary | ICD-10-CM

## 2019-09-16 DIAGNOSIS — Z113 Encounter for screening for infections with a predominantly sexual mode of transmission: Secondary | ICD-10-CM | POA: Diagnosis not present

## 2019-09-16 DIAGNOSIS — N898 Other specified noninflammatory disorders of vagina: Secondary | ICD-10-CM

## 2019-09-16 LAB — POCT URINALYSIS DIPSTICK
Blood, UA: NEGATIVE
Glucose, UA: NEGATIVE
Leukocytes, UA: NEGATIVE
Nitrite, UA: NEGATIVE
Protein, UA: NEGATIVE

## 2019-09-16 NOTE — Progress Notes (Signed)
  Subjective:     Patient ID: Kimberly Norris, female   DOB: Apr 11, 1995, 24 y.o.   MRN: [id number]  HPI Kimberly Norris is a 24 year old white female,single, G0P0, in complaining of vaginal itching and urinary frequency and occasional burn. She has nexplanon and uses condoms.  PCP is [facility].   Review of Systems +vaginal itching .urinary frequency and burns at times Reviewed past medical,surgical, social and family history. Reviewed medications and allergies.     Objective:   Physical Exam BP 125/81 (BP Location: Left Arm, Patient Position: Sitting, Cuff Size: Normal)   Pulse 93   Ht 5\' 1"  (1.549 m)   Wt 187 lb (84.8 kg)   LMP  (LMP Unknown)   BMI 35.33 kg/m urine dipstick is negative Skin warm and dry.Pelvic: external genitalia is normal in appearance no lesions, vagina: scant white discharge without odor,urethra has no lesions or masses noted, cervix:smooth, uterus: normal size, shape and contour, non tender, no masses felt, adnexa: no masses or tenderness noted. Bladder is non tender and no masses felt. CV swab is negative. Examination chaperoned by LPN.   [facility] - 09/16/19 1209      Pregnancy Intention Screening   Does the patient want to become pregnant in the next year? No    Does the patient's partner want to become pregnant in the next year? No    Would the patient like to discuss contraceptive options today? No      Contraception Wrap Up   Current Method Hormonal Implant    End Method Hormonal Implant    Contraception Counseling Provided No             Assessment:     1. Vaginal itching CV swab sent   2. Urinary frequency  3. Screening examination for STD (sexually transmitted disease) CV swab sent    Plan:     Follow up prn

## 2019-09-17 LAB — CERVICOVAGINAL ANCILLARY ONLY
Bacterial Vaginitis (gardnerella): NEGATIVE
Candida Glabrata: NEGATIVE
Candida Vaginitis: NEGATIVE
Chlamydia: NEGATIVE
Comment: NEGATIVE
Comment: NEGATIVE
Comment: NEGATIVE
Comment: NEGATIVE
Comment: NEGATIVE
Comment: NORMAL
Neisseria Gonorrhea: NEGATIVE
Trichomonas: NEGATIVE

## 2019-11-01 ENCOUNTER — Encounter: Payer: Self-pay | Admitting: Gastroenterology

## 2019-11-01 NOTE — Progress Notes (Deleted)
Referring Provider: [facility], [facility] Aschoff* Primary Care Physician:  [facility], [facility] Primary GI Physician: Dr. Marletta Lor  No chief complaint on file.   HPI:   Kimberly Norris is a 24 y.o. female presenting today for follow-up of nausea without vomiting, early satiety, RUQ TTP, GERD, and weight loss.   Last seen in our office 08/18/2019 at the time of initial consult. Prior evaluation with RUQ ultrasound in May 2021 with fatty liver, no other significant findings. She reported daily nausea without vomiting that worsened with eating, early satiety with associated weight loss. Reported mild tenderness in the right side of her abdomen that started in October 2020 which she only noticed if she pressed on the area. Felt there was a bulge here. Daily GERD symptoms not on any medication. Drinking soda daily. BC powders twice a month for headaches. Abdominal exam was benign. Plan to update CBC, CMP, start Protonix 40 mg daily, Zofran as needed, EGD. Consider HIDA if no significant findings on EGD and persistent symptoms despite PPI. We also discussed fatty liver. She had no signs or symptoms of advanced liver disease.She was counseled on importance of weight loss through diet and exercise.  EGD 09/11/2019: Esophageal mucosal changes suspicious for eosinophilic esophagitis s/p biopsy, gastritis s/p biopsy, normal examined duodenum biopsy. Esophageal biopsy was benign with no increased intraepithelial eosinophils, gastric biopsy benign without H. pylori, duodenal biopsy benign.  Today:  Past Medical History:  Diagnosis Date  . Bilateral chronic knee pain 04/16/2012  . Heartburn   . Liver disease   . Mental disorder    anxiety, depression  . Rhabdomyolysis     Past Surgical History:  Procedure Laterality Date  . BIOPSY  09/11/2019   Procedure: BIOPSY;  Surgeon: Lanelle Bal, DO;  [facility];  Service: Endoscopy;;  . ESOPHAGOGASTRODUODENOSCOPY (EGD) WITH  PROPOFOL N/A 09/11/2019   Procedure: ESOPHAGOGASTRODUODENOSCOPY (EGD) WITH PROPOFOL;  Surgeon: Lanelle Bal, DO;  Esophageal mucosal changes suspicious for eosinophilic esophagitis s/p biopsy, gastritis s/p biopsy, normal examined duodenum biopsy.  All pathology was benign.     Current Outpatient Medications  Medication Sig Dispense Refill  . calcium carbonate (TUMS - DOSED IN MG ELEMENTAL CALCIUM) 500 MG chewable tablet Chew 1 tablet by mouth 2 (two) times daily as needed for indigestion or heartburn.    . etonogestrel (NEXPLANON) 68 MG IMPL implant Inject 68 mg into the skin once.     . ondansetron (ZOFRAN) 4 MG tablet Take 1 tablet (4 mg total) by mouth every 8 (eight) hours as needed for nausea or vomiting. 30 tablet 1  . pantoprazole (PROTONIX) 40 MG tablet Take 1 tablet (40 mg total) by mouth daily. 90 tablet 3   No current facility-administered medications for this visit.    Allergies as of 11/03/2019 - Review Complete 09/16/2019  Allergen Reaction Noted  . Flagyl [metronidazole] Rash 03/06/2019    Family History  Problem Relation Age of Onset  . Autism Sister        1 sister    Social History   Socioeconomic History  . Marital status: Single    Spouse name: Not on file  . Number of children: Not on file  . Years of education: Not on file  . Highest education level: Not on file  Occupational History  . Not on file  Tobacco Use  . Smoking status: Current Every Day Smoker    Packs/day: 0.50    Types: Cigarettes  . Smokeless tobacco: Never  Used  . Tobacco comment: smokes 1-2 cig daily  Vaping Use  . Vaping Use: Former  Substance and Sexual Activity  . Alcohol use: No  . Drug use: No  . Sexual activity: Yes    Birth control/protection: Implant  Other Topics Concern  . Not on file  Social History Narrative  . Not on file   Social Determinants of Health   Financial Resource Strain:   . Difficulty of Paying Living Expenses: Not on file  Food Insecurity:     . Worried About Programme researcher, broadcasting/film/video in the Last Year: Not on file  . Ran Out of Food in the Last Year: Not on file  Transportation Needs:   . Lack of Transportation (Medical): Not on file  . Lack of Transportation (Non-Medical): Not on file  Physical Activity:   . Days of Exercise per Week: Not on file  . Minutes of Exercise per Session: Not on file  Stress:   . Feeling of Stress : Not on file  Social Connections:   . Frequency of Communication with Friends and Family: Not on file  . Frequency of Social Gatherings with Friends and Family: Not on file  . Attends Religious Services: Not on file  . Active Member of Clubs or Organizations: Not on file  . Attends Banker Meetings: Not on file  . Marital Status: Not on file    Review of Systems: Gen: Denies fever, chills, anorexia. Denies fatigue, weakness, weight loss.  CV: Denies chest pain, palpitations, syncope, peripheral edema, and claudication. Resp: Denies dyspnea at rest, cough, wheezing, coughing up blood, and pleurisy. GI: Denies vomiting blood, jaundice, and fecal incontinence.   Denies dysphagia or odynophagia. Derm: Denies rash, itching, dry skin Psych: Denies depression, anxiety, memory loss, confusion. No homicidal or suicidal ideation.  Heme: Denies bruising, bleeding, and enlarged lymph nodes.  Physical Exam: There were no vitals taken for this visit. General:   Alert and oriented. No distress noted. Pleasant and cooperative.  Head:  Normocephalic and atraumatic. Eyes:  Conjuctiva clear without scleral icterus. Mouth:  Oral mucosa pink and moist. Good dentition. No lesions. Heart:  S1, S2 present without murmurs appreciated. Lungs:  Clear to auscultation bilaterally. No wheezes, rales, or rhonchi. No distress.  Abdomen:  +BS, soft, non-tender and non-distended. No rebound or guarding. No HSM or masses noted. Msk:  Symmetrical without gross deformities. Normal posture. Extremities:  Without  edema. Neurologic:  Alert and  oriented x4 Psych:  Alert and cooperative. Normal mood and affect.

## 2019-11-03 ENCOUNTER — Ambulatory Visit: Payer: Medicaid Other | Admitting: Gastroenterology

## 2019-11-20 NOTE — Progress Notes (Deleted)
Referring Provider: [facility], [facility] Aschoff* Primary Care Physician:  [facility], [facility] Primary GI Physician: Dr. Marletta Lor  No chief complaint on file.   HPI:   Kimberly Norris is a 24 y.o. female presenting today for follow-up of nausea without vomiting, GERD, early satiety, weight loss, and fatty liver s/p EGD.  She was last seen in our office 08/18/2019 at the time of initial consult.  He reported mild tenderness palpation in the RUQ since October 2020.  Denied any pain in the RUQ except with palpation.  No postprandial RUQ pain.  She did report daily nausea worsened by eating without vomiting.  Stated watermelon was only thing that did not make her nauseated.  Also with heartburn/reflux daily, early satiety, and weight loss.  Stated she used to be 135 pounds, initially gained quite a bit of weight due to stress and missing her goal weight when trying to get to the [employer].  She had gained up to 200 pounds but had lost back down to 186.  No significant lower GI symptoms.  BC powders a couple times a month.  Abdominal exam was benign.  She had RUQ ultrasound in May 2021 with gallbladder and CBD normal, evidence of fatty liver.  Suspected symptoms to be secondary to uncontrolled GERD, gastritis, esophagitis, duodenitis.  Plan to update CBC, CMP, pursue EGD to rule out gastric outlet obstruction, start Protonix 40 mg daily, Zofran as needed, follow-up after EGD and consider HIDA if no significant findings and symptoms persist.  She was also counseled on fatty liver.  CBC and CMP were within normal limits.  EGD 09/11/2019: Esophageal mucosal changes suspicious for eosinophilic esophagitis s/p biopsy, gastritis s/p biopsy, normal examined duodenum s/p biopsy.  Esophageal biopsy was benign, gastric biopsy benign and negative for H. pylori, duodenal biopsy benign.  She was advised to continue PPI daily and follow-up in the clinic.  Today:   Past Medical History:  Diagnosis Date   . Bilateral chronic knee pain 04/16/2012  . Heartburn   . Liver disease   . Mental disorder    anxiety, depression  . Rhabdomyolysis     Past Surgical History:  Procedure Laterality Date  . BIOPSY  09/11/2019   Procedure: BIOPSY;  Surgeon: Lanelle Bal, DO;  [facility];  Service: Endoscopy;;  . ESOPHAGOGASTRODUODENOSCOPY (EGD) WITH PROPOFOL N/A 09/11/2019   Procedure: ESOPHAGOGASTRODUODENOSCOPY (EGD) WITH PROPOFOL;  Surgeon: Lanelle Bal, DO;  Esophageal mucosal changes suspicious for eosinophilic esophagitis s/p biopsy, gastritis s/p biopsy, normal examined duodenum biopsy.  All pathology was benign.     Current Outpatient Medications  Medication Sig Dispense Refill  . calcium carbonate (TUMS - DOSED IN MG ELEMENTAL CALCIUM) 500 MG chewable tablet Chew 1 tablet by mouth 2 (two) times daily as needed for indigestion or heartburn.    . etonogestrel (NEXPLANON) 68 MG IMPL implant Inject 68 mg into the skin once.     . ondansetron (ZOFRAN) 4 MG tablet Take 1 tablet (4 mg total) by mouth every 8 (eight) hours as needed for nausea or vomiting. 30 tablet 1  . pantoprazole (PROTONIX) 40 MG tablet Take 1 tablet (40 mg total) by mouth daily. 90 tablet 3   No current facility-administered medications for this visit.    Allergies as of 11/21/2019 - Review Complete 09/16/2019  Allergen Reaction Noted  . Flagyl [metronidazole] Rash 03/06/2019    Family History  Problem Relation Age of Onset  . Autism Sister  1 sister    Social History   Socioeconomic History  . Marital status: Single    Spouse name: Not on file  . Number of children: Not on file  . Years of education: Not on file  . Highest education level: Not on file  Occupational History  . Not on file  Tobacco Use  . Smoking status: Current Every Day Smoker    Packs/day: 0.50    Types: Cigarettes  . Smokeless tobacco: Never Used  . Tobacco comment: smokes 1-2 cig daily  Vaping Use  . Vaping Use:  Former  Substance and Sexual Activity  . Alcohol use: No  . Drug use: No  . Sexual activity: Yes    Birth control/protection: Implant  Other Topics Concern  . Not on file  Social History Narrative  . Not on file   Social Determinants of Health   Financial Resource Strain:   . Difficulty of Paying Living Expenses: Not on file  Food Insecurity:   . Worried About Programme researcher, broadcasting/film/video in the Last Year: Not on file  . Ran Out of Food in the Last Year: Not on file  Transportation Needs:   . Lack of Transportation (Medical): Not on file  . Lack of Transportation (Non-Medical): Not on file  Physical Activity:   . Days of Exercise per Week: Not on file  . Minutes of Exercise per Session: Not on file  Stress:   . Feeling of Stress : Not on file  Social Connections:   . Frequency of Communication with Friends and Family: Not on file  . Frequency of Social Gatherings with Friends and Family: Not on file  . Attends Religious Services: Not on file  . Active Member of Clubs or Organizations: Not on file  . Attends Banker Meetings: Not on file  . Marital Status: Not on file    Review of Systems: Gen: Denies fever, chills, anorexia. Denies fatigue, weakness, weight loss.  CV: Denies chest pain, palpitations, syncope, peripheral edema, and claudication. Resp: Denies dyspnea at rest, cough, wheezing, coughing up blood, and pleurisy. GI: Denies vomiting blood, jaundice, and fecal incontinence.   Denies dysphagia or odynophagia. Derm: Denies rash, itching, dry skin Psych: Denies depression, anxiety, memory loss, confusion. No homicidal or suicidal ideation.  Heme: Denies bruising, bleeding, and enlarged lymph nodes.  Physical Exam: There were no vitals taken for this visit. General:   Alert and oriented. No distress noted. Pleasant and cooperative.  Head:  Normocephalic and atraumatic. Eyes:  Conjuctiva clear without scleral icterus. Mouth:  Oral mucosa pink and moist. Good  dentition. No lesions. Heart:  S1, S2 present without murmurs appreciated. Lungs:  Clear to auscultation bilaterally. No wheezes, rales, or rhonchi. No distress.  Abdomen:  +BS, soft, non-tender and non-distended. No rebound or guarding. No HSM or masses noted. Msk:  Symmetrical without gross deformities. Normal posture. Extremities:  Without edema. Neurologic:  Alert and  oriented x4 Psych:  Alert and cooperative. Normal mood and affect.

## 2019-11-21 ENCOUNTER — Ambulatory Visit: Payer: Medicaid Other | Admitting: Gastroenterology

## 2019-12-17 ENCOUNTER — Telehealth: Payer: Self-pay | Admitting: *Deleted

## 2019-12-17 NOTE — Telephone Encounter (Signed)
Patient states she thinks she is having an outbreak but may be a fever blister.  Wants to have a full STD check as well.  Scheduled for tomorrow with Victorino Dike.

## 2019-12-18 ENCOUNTER — Ambulatory Visit (INDEPENDENT_AMBULATORY_CARE_PROVIDER_SITE_OTHER): Payer: Medicaid Other | Admitting: Adult Health

## 2019-12-18 ENCOUNTER — Other Ambulatory Visit (HOSPITAL_COMMUNITY)
Admission: RE | Admit: 2019-12-18 | Discharge: 2019-12-18 | Disposition: A | Discharge status: Home / Self Care | Payer: Medicaid Other | Source: Ambulatory Visit | Attending: Adult Health | Admitting: Adult Health

## 2019-12-18 ENCOUNTER — Other Ambulatory Visit: Payer: Self-pay

## 2019-12-18 ENCOUNTER — Encounter: Payer: Self-pay | Admitting: Adult Health

## 2019-12-18 ENCOUNTER — Ambulatory Visit
Admission: EM | Admit: 2019-12-18 | Discharge: 2019-12-18 | Disposition: A | Discharge status: Home / Self Care | Payer: Medicaid Other | Attending: Emergency Medicine | Admitting: Emergency Medicine

## 2019-12-18 VITALS — BP 128/79 | HR 100 | Ht 61.0 in | Wt 188.5 lb

## 2019-12-18 DIAGNOSIS — R0981 Nasal congestion: Secondary | ICD-10-CM

## 2019-12-18 DIAGNOSIS — R059 Cough, unspecified: Secondary | ICD-10-CM

## 2019-12-18 DIAGNOSIS — Z113 Encounter for screening for infections with a predominantly sexual mode of transmission: Secondary | ICD-10-CM | POA: Insufficient documentation

## 2019-12-18 DIAGNOSIS — Z1152 Encounter for screening for COVID-19: Secondary | ICD-10-CM

## 2019-12-18 DIAGNOSIS — B001 Herpesviral vesicular dermatitis: Secondary | ICD-10-CM

## 2019-12-18 MED ORDER — VALACYCLOVIR HCL 1 G PO TABS: ORAL_TABLET | ORAL | 1 refills | Status: DC

## 2019-12-18 MED ORDER — PREDNISONE 20 MG PO TABS: 20.0000 mg | ORAL_TABLET | Freq: Two times a day (BID) | ORAL | 0 refills | Status: AC

## 2019-12-18 MED ORDER — BENZONATATE 100 MG PO CAPS: 100.0000 mg | ORAL_CAPSULE | Freq: Three times a day (TID) | ORAL | 0 refills | Status: DC

## 2019-12-18 NOTE — Discharge Instructions (Signed)

## 2019-12-18 NOTE — Progress Notes (Signed)
  Subjective:     Patient ID: Kimberly Norris, female   DOB: 27-Oct-1995, 24 y.o.   MRN: [id number]  HPI [name] is a 24 year old white female,single, G0P0, in requesting STD testing and has ?cold sore top lip.She has nexplanon and says she uses condoms.  PCP is [facility].  Review of Systems ?cold sore Denies any discharge  Reviewed past medical,surgical, social and family history. Reviewed medications and allergies.     Objective:   Physical Exam BP 128/79 (BP Location: Left Arm, Patient Position: Sitting, Cuff Size: Large)   Pulse 100   Ht 5\' 1"  (1.549 m)   Wt 188 lb 8 oz (85.5 kg)   BMI 35.62 kg/m  Skin warm and dry.Pelvic: external genitalia is normal in appearance no lesions, vagina: pink scant white discharge,urethra has no lesions or masses noted, cervix:smooth, uterus: normal size, shape and contour, non tender, no masses felt, adnexa: no masses or tenderness noted. Bladder is non tender and no masses felt. CV swab obtained  Has what looks like cold sore left corner top lip. Fall risk is low  Upstream - 12/18/19 1125      Pregnancy Intention Screening   Does the patient want to become pregnant in the next year? No    Does the patient's partner want to become pregnant in the next year? No    Would the patient like to discuss contraceptive options today? No      Contraception Wrap Up   Current Method Hormonal Implant    End Method Hormonal Implant    Contraception Counseling Provided No         Examination chaperoned by 14/02/21 LPN    Assessment:     1. Screen for STD (sexually transmitted disease) Orders Placed This Encounter  Procedures  . Hepatitis C antibody  . HIV Antibody (routine testing w rflx)  . RPR  . HSV 1 antibody, IgG  . HSV 2 antibody, IgG    2. Cold sore Will rx valtrex Meds ordered this encounter  Medications  . valACYclovir (VALTREX) 1000 MG tablet    Sig: Take 2 now and 2 tomorrow for cold sores    Dispense:  10 tablet    Refill:  1     Order Specific Question:   Supervising Provider    Answer:   Malachy Mood [[id number]]      Plan:     Will talk when labs back Follow up prn

## 2019-12-18 NOTE — ED Triage Notes (Signed)
Pt presents with nasal congestion and cough for past week  

## 2019-12-18 NOTE — ED Provider Notes (Signed)
[facility]   [id number] 12/18/19 Arrival Time: 1442   CC: Cough  SUBJECTIVE: History from: patient.  Kimberly Norris is a 24 y.o. female who presents with nasal congestion and cough x 1 week.  Relative with similar symptoms.  Denies known covid exposure.  Has tried OTC medications without relief.  Symptoms are made worse throughout the day.  Reports previous symptoms in the past.   Denies fever, chills, SOB, wheezing, chest pain, nausea, changes in bowel or bladder habits.    ROS: As per HPI.  All other pertinent ROS negative.     Past Medical History:  Diagnosis Date  . Bilateral chronic knee pain 04/16/2012  . Heartburn   . Liver disease   . Mental disorder    anxiety, depression  . Rhabdomyolysis    Past Surgical History:  Procedure Laterality Date  . BIOPSY  09/11/2019   Procedure: BIOPSY;  Surgeon: Lanelle Bal, DO;  [facility];  Service: Endoscopy;;  . ESOPHAGOGASTRODUODENOSCOPY (EGD) WITH PROPOFOL N/A 09/11/2019   Procedure: ESOPHAGOGASTRODUODENOSCOPY (EGD) WITH PROPOFOL;  Surgeon: Lanelle Bal, DO;  Esophageal mucosal changes suspicious for eosinophilic esophagitis s/p biopsy, gastritis s/p biopsy, normal examined duodenum biopsy.  All pathology was benign.    Allergies  Allergen Reactions  . Flagyl [Metronidazole] Rash   No current facility-administered medications on file prior to encounter.   Current Outpatient Medications on File Prior to Encounter  Medication Sig Dispense Refill  . calcium carbonate (TUMS - DOSED IN MG ELEMENTAL CALCIUM) 500 MG chewable tablet Chew 1 tablet by mouth 2 (two) times daily as needed for indigestion or heartburn.    . etonogestrel (NEXPLANON) 68 MG IMPL implant Inject 68 mg into the skin once.     . Homeopathic Products (SINUS MEDICINE PO) Take by mouth.    . pantoprazole (PROTONIX) 40 MG tablet Take 1 tablet (40 mg total) by mouth daily. 90 tablet 3  . valACYclovir (VALTREX) 1000 MG tablet Take 2 now  and 2 tomorrow for cold sores 10 tablet 1   Social History   Socioeconomic History  . Marital status: Single    Spouse name: Not on file  . Number of children: Not on file  . Years of education: Not on file  . Highest education level: Not on file  Occupational History  . Not on file  Tobacco Use  . Smoking status: Current Every Day Smoker    Packs/day: 0.50    Types: Cigarettes  . Smokeless tobacco: Never Used  . Tobacco comment: smokes 1-2 cig daily  Vaping Use  . Vaping Use: Former  Substance and Sexual Activity  . Alcohol use: No  . Drug use: No  . Sexual activity: Yes    Birth control/protection: Implant  Other Topics Concern  . Not on file  Social History Narrative  . Not on file   Social Determinants of Health   Financial Resource Strain:   . Difficulty of Paying Living Expenses: Not on file  Food Insecurity:   . Worried About Programme researcher, broadcasting/film/video in the Last Year: Not on file  . Ran Out of Food in the Last Year: Not on file  Transportation Needs:   . Lack of Transportation (Medical): Not on file  . Lack of Transportation (Non-Medical): Not on file  Physical Activity:   . Days of Exercise per Week: Not on file  . Minutes of Exercise per Session: Not on file  Stress:   . Feeling of Stress :  Not on file  Social Connections:   . Frequency of Communication with Friends and Family: Not on file  . Frequency of Social Gatherings with Friends and Family: Not on file  . Attends Religious Services: Not on file  . Active Member of Clubs or Organizations: Not on file  . Attends Banker Meetings: Not on file  . Marital Status: Not on file  Intimate Partner Violence:   . Fear of Current or Ex-Partner: Not on file  . Emotionally Abused: Not on file  . Physically Abused: Not on file  . Sexually Abused: Not on file   Family History  Problem Relation Age of Onset  . Autism Sister        1 sister    OBJECTIVE:  Vitals:   12/18/19 1524  BP: 122/82    Pulse: 84  Resp: 20  Temp: 98.8 F (37.1 C)  SpO2: 97%     General appearance: alert; appears mildly fatigued, but nontoxic; speaking in full sentences and tolerating own secretions HEENT: NCAT; Ears: EACs clear, TMs pearly gray; Eyes: PERRL.  EOM grossly intact. Nose: nares patent without rhinorrhea, Throat: oropharynx clear, tonsils non erythematous or enlarged, uvula midline  Neck: supple without LAD Lungs: unlabored respirations, symmetrical air entry; cough: absent; no respiratory distress; CTAB Heart: regular rate and rhythm.  Skin: warm and dry Psychological: alert and cooperative; normal mood and affect  ASSESSMENT & PLAN:  1. Encounter for screening for COVID-19   2. Nasal congestion   3. Cough     Meds ordered this encounter  Medications  . benzonatate (TESSALON) 100 MG capsule    Sig: Take 1 capsule (100 mg total) by mouth every 8 (eight) hours.    Dispense:  21 capsule    Refill:  0    Order Specific Question:   Supervising Provider    Answer:   Eustace Moore [[id number]]  . predniSONE (DELTASONE) 20 MG tablet    Sig: Take 1 tablet (20 mg total) by mouth 2 (two) times daily with a meal for 5 days.    Dispense:  10 tablet    Refill:  0    Order Specific Question:   Supervising Provider    Answer:   Eustace Moore [[id number]]   COVID testing ordered.  It will take between 5-7 days for test results.  Someone will contact you regarding abnormal results.    In the meantime: You should remain isolated in your home for 10 days from symptom onset AND greater than 72 hours after symptoms resolution (absence of fever without the use of fever-reducing medication and improvement in respiratory symptoms), whichever is longer Get plenty of rest and push fluids Tessalon Perles prescribed for cough Use OTC zyrtec for nasal congestion, runny nose, and/or sore throat Use OTC flonase for nasal congestion and runny nose Use medications daily for symptom relief Use OTC  medications like ibuprofen or tylenol as needed fever or pain Call or go to the ED if you have any new or worsening symptoms such as fever, worsening cough, shortness of breath, chest tightness, chest pain, turning blue, changes in mental status, etc...   Prednisone prescribed for congestion.  She would like to hold off on antibiotics until she received covid/ flu results.    Reviewed expectations re: course of current medical issues. Questions answered. Outlined signs and symptoms indicating need for more acute intervention. Patient verbalized understanding. After Visit Summary given.         Percell Lamboy,  [place], PA-C 12/18/19 1554

## 2019-12-19 LAB — CERVICOVAGINAL ANCILLARY ONLY
Bacterial Vaginitis (gardnerella): NEGATIVE
Candida Glabrata: NEGATIVE
Candida Vaginitis: NEGATIVE
Chlamydia: NEGATIVE
Comment: NEGATIVE
Comment: NEGATIVE
Comment: NEGATIVE
Comment: NEGATIVE
Comment: NEGATIVE
Comment: NORMAL
Neisseria Gonorrhea: NEGATIVE
Trichomonas: NEGATIVE

## 2019-12-19 LAB — COVID-19, FLU A+B AND RSV
Influenza A, NAA: NOT DETECTED
Influenza B, NAA: NOT DETECTED
RSV, NAA: NOT DETECTED
SARS-CoV-2, NAA: NOT DETECTED

## 2019-12-19 LAB — RPR: RPR Ser Ql: NONREACTIVE

## 2019-12-19 LAB — HIV ANTIBODY (ROUTINE TESTING W REFLEX): HIV Screen 4th Generation wRfx: NONREACTIVE

## 2019-12-19 LAB — HSV 2 ANTIBODY, IGG: HSV 2 IgG, Type Spec: <0.91 index (ref 0.00–0.90)

## 2019-12-19 LAB — HSV 1 ANTIBODY, IGG: HSV 1 Glycoprotein G Ab, IgG: <0.91 index (ref 0.00–0.90)

## 2019-12-19 LAB — HEPATITIS C ANTIBODY: Hep C Virus Ab: <0.1 s/co ratio (ref 0.0–0.9)

## 2020-02-02 NOTE — Progress Notes (Deleted)
Referring Provider: Alliance, Miguel Aschoff* Primary Care Physician:  Wilmon Pali, FNP Primary GI Physician: Dr. Bonnetta Barry chief complaint on file.   HPI:   Kimberly Norris is a 25 y.o. female presenting today for follow-up. Last seen in our office at the time of initial consult on 08/19/2019 for fatty liver, GERD, early satiety, nausea without vomiting, and weight loss.  She had RUQ ultrasound in May 2021 with no gallbladder abnoas rmalities, evidence of fatty liver.  At the time of her office visit, she felt there wa bulge in her RUQ with no associated abdominal pain unless pressing on the area.  Daily nausea no that was worsened postprandially.  Associated 14 lb weight loss in 9 months.  Daily GERD symptoms with early satiety, not on PPI.  No significant lower GI symptoms.  Abdominal exam was benign.  Plan to update labs, start Protonix 40 mg daily, Zofran as needed, and proceed with EGD.  She was also counseled on fatty liver and handout was provided.  Labs 08/19/2019: CBC and CMP within normal limits. EGD 09/11/2019: Esophageal mucosal changes suspicious for EOE s/p biopsy, gastritis s/p biopsy, normal examined duodenum s/p biopsy.  All pathology was benign.  Patient canceled her follow-up appointment 11/03/2019 and no-show to follow-up appointment on 11/21/2019.  Today:    Past Medical History:  Diagnosis Date  . Bilateral chronic knee pain 04/16/2012  . Heartburn   . Liver disease   . Mental disorder    anxiety, depression  . Rhabdomyolysis     Past Surgical History:  Procedure Laterality Date  . BIOPSY  09/11/2019   Procedure: BIOPSY;  Surgeon: Lanelle Bal, DO;  [facility]: [facility];  Service: Endoscopy;;  . ESOPHAGOGASTRODUODENOSCOPY (EGD) WITH PROPOFOL N/A 09/11/2019   Procedure: ESOPHAGOGASTRODUODENOSCOPY (EGD) WITH PROPOFOL;  Surgeon: Lanelle Bal, DO;  Esophageal mucosal changes suspicious for eosinophilic esophagitis s/p biopsy, gastritis s/p biopsy, normal  examined duodenum biopsy.  All pathology was benign.     Current Outpatient Medications  Medication Sig Dispense Refill  . benzonatate (TESSALON) 100 MG capsule Take 1 capsule (100 mg total) by mouth every 8 (eight) hours. 21 capsule 0  . calcium carbonate (TUMS - DOSED IN MG ELEMENTAL CALCIUM) 500 MG chewable tablet Chew 1 tablet by mouth 2 (two) times daily as needed for indigestion or heartburn.    . etonogestrel (NEXPLANON) 68 MG IMPL implant Inject 68 mg into the skin once.     . Homeopathic Products (SINUS MEDICINE PO) Take by mouth.    . pantoprazole (PROTONIX) 40 MG tablet Take 1 tablet (40 mg total) by mouth daily. 90 tablet 3  . valACYclovir (VALTREX) 1000 MG tablet Take 2 now and 2 tomorrow for cold sores 10 tablet 1   No current facility-administered medications for this visit.    Allergies as of 02/04/2020 - Review Complete 12/18/2019  Allergen Reaction Noted  . Flagyl [metronidazole] Rash 03/06/2019    Family History  Problem Relation Age of Onset  . Autism Sister        1 sister    Social History   Socioeconomic History  . Marital status: Single    Spouse name: Not on file  . Number of children: Not on file  . Years of education: Not on file  . Highest education level: Not on file  Occupational History  . Not on file  Tobacco Use  . Smoking status: Current Every Day Smoker    Packs/day: 0.50  Types: Cigarettes  . Smokeless tobacco: Never Used  . Tobacco comment: smokes 1-2 cig daily  Vaping Use  . Vaping Use: Former  Substance and Sexual Activity  . Alcohol use: No  . Drug use: No  . Sexual activity: Yes    Birth control/protection: Implant  Other Topics Concern  . Not on file  Social History Narrative  . Not on file   Social Determinants of Health   Financial Resource Strain: Not on file  Food Insecurity: Not on file  Transportation Needs: Not on file  Physical Activity: Not on file  Stress: Not on file  Social Connections: Not on file     Review of Systems: Gen: Denies fever, chills, anorexia. Denies fatigue, weakness, weight loss.  CV: Denies chest pain, palpitations, syncope, peripheral edema, and claudication. Resp: Denies dyspnea at rest, cough, wheezing, coughing up blood, and pleurisy. GI: Denies vomiting blood, jaundice, and fecal incontinence.   Denies dysphagia or odynophagia. Derm: Denies rash, itching, dry skin Psych: Denies depression, anxiety, memory loss, confusion. No homicidal or suicidal ideation.  Heme: Denies bruising, bleeding, and enlarged lymph nodes.  Physical Exam: There were no vitals taken for this visit. General:   Alert and oriented. No distress noted. Pleasant and cooperative.  Head:  Normocephalic and atraumatic. Eyes:  Conjuctiva clear without scleral icterus. Mouth:  Oral mucosa pink and moist. Good dentition. No lesions. Heart:  S1, S2 present without murmurs appreciated. Lungs:  Clear to auscultation bilaterally. No wheezes, rales, or rhonchi. No distress.  Abdomen:  +BS, soft, non-tender and non-distended. No rebound or guarding. No HSM or masses noted. Msk:  Symmetrical without gross deformities. Normal posture. Extremities:  Without edema. Neurologic:  Alert and  oriented x4 Psych:  Alert and cooperative. Normal mood and affect.

## 2020-02-04 ENCOUNTER — Encounter: Payer: Self-pay | Admitting: Internal Medicine

## 2020-02-04 ENCOUNTER — Ambulatory Visit: Payer: Medicaid Other | Admitting: Gastroenterology

## 2020-02-05 ENCOUNTER — Ambulatory Visit: Payer: Medicaid Other | Admitting: General Surgery

## 2020-02-17 ENCOUNTER — Encounter: Payer: Self-pay | Admitting: General Surgery

## 2020-02-17 ENCOUNTER — Other Ambulatory Visit: Payer: Self-pay

## 2020-02-17 ENCOUNTER — Ambulatory Visit (INDEPENDENT_AMBULATORY_CARE_PROVIDER_SITE_OTHER): Payer: Medicaid Other | Admitting: General Surgery

## 2020-02-17 VITALS — BP 115/73 | HR 90 | Temp 97.3°F | Resp 16 | Ht 61.0 in | Wt 182.0 lb

## 2020-02-17 DIAGNOSIS — K602 Anal fissure, unspecified: Secondary | ICD-10-CM

## 2020-02-17 DIAGNOSIS — K644 Residual hemorrhoidal skin tags: Secondary | ICD-10-CM | POA: Diagnosis not present

## 2020-02-17 MED ORDER — DILTIAZEM GEL 2 %: 1.0000 "application " | Freq: Four times a day (QID) | CUTANEOUS | 2 refills | Status: DC

## 2020-02-17 NOTE — Addendum Note (Signed)
Addended by: Legrand Rams B on: 02/17/2020 03:01 PM   Modules accepted: Orders

## 2020-02-17 NOTE — Progress Notes (Signed)
[facility] History and Physical  Reason for Referral: Perianal tag  Referring Physician: Wilmon Pali, FNP   Chief Complaint    New Patient (Initial Visit)      Kimberly Norris is a 25 y.o. female.  HPI:  Kimberly Norris is a 25 yo who comes in from her PCP with complaints of a tag in her perianal region that she feels like is causing her discomfort and is bleeding. She says she has some minor bleeding with BMs. She has had hard stools in the past and has had some pain with this that was stabbing in nature. She otherwise has 1-3 stools a day. She is also very self conscious of the tag per her report.   Past Medical History:  Diagnosis Date  . Bilateral chronic knee pain 04/16/2012  . Heartburn   . Liver disease   . Mental disorder    anxiety, depression  . Rhabdomyolysis     Past Surgical History:  Procedure Laterality Date  . BIOPSY  09/11/2019   Procedure: BIOPSY;  Surgeon: Lanelle Bal, DO;  [facility]: [facility];  Service: Endoscopy;;  . ESOPHAGOGASTRODUODENOSCOPY (EGD) WITH PROPOFOL N/A 09/11/2019   Procedure: ESOPHAGOGASTRODUODENOSCOPY (EGD) WITH PROPOFOL;  Surgeon: Lanelle Bal, DO;  Esophageal mucosal changes suspicious for eosinophilic esophagitis s/p biopsy, gastritis s/p biopsy, normal examined duodenum biopsy.  All pathology was benign.     Family History  Problem Relation Age of Onset  . Autism Sister        1 sister    Social History   Tobacco Use  . Smoking status: Current Every Day Smoker    Packs/day: 0.50    Types: Cigarettes  . Smokeless tobacco: Never Used  . Tobacco comment: smokes 1-2 cig daily  Vaping Use  . Vaping Use: Former  Substance Use Topics  . Alcohol use: No  . Drug use: No    Medications: I have reviewed the patient's current medications. Allergies as of 02/17/2020      Reactions   Flagyl [metronidazole] Rash      Medication List       Accurate as of February 17, 2020  1:35 PM. If you have any  questions, ask your nurse or doctor.        STOP taking these medications   benzonatate 100 MG capsule Commonly known as: TESSALON Stopped by: [name], MD   calcium carbonate 500 MG chewable tablet Commonly known as: TUMS - dosed in mg elemental calcium Stopped by: [name], MD   pantoprazole 40 MG tablet Commonly known as: PROTONIX Stopped by: [name], MD   SINUS MEDICINE PO Stopped by: [name], MD   valACYclovir 1000 MG tablet Commonly known as: Valtrex Stopped by: [name], MD     TAKE these medications   etonogestrel 68 MG Impl implant Commonly known as: NEXPLANON Inject 68 mg into the skin once.        ROS:  A comprehensive review of systems was negative except for: Gastrointestinal: positive for abdominal pain, nausea, reflux symptoms and perianal skin tag and minor bleeding  Blood pressure 115/73, pulse 90, temperature (!) 97.3 F (36.3 C), temperature source Other (Comment), resp. rate 16, height 5\' 1"  (1.549 m), weight 182 lb (82.6 kg), SpO2 98 %. Physical Exam Vitals reviewed.  Constitutional:      Appearance: Normal appearance.  HENT:     Head: Normocephalic.     Nose: Nose normal.  Eyes:     Extraocular Movements: Extraocular movements intact.  Cardiovascular:     Rate and Rhythm: Normal rate and regular rhythm.  Pulmonary:     Effort: Pulmonary effort is normal.     Breath sounds: Normal breath sounds.  Abdominal:     General: There is no distension.     Palpations: Abdomen is soft.     Tenderness: There is no abdominal tenderness.  Genitourinary:    Rectum: Tenderness and anal fissure present. No external hemorrhoid.     Comments: Extra skin in the anterior rectal area /perineum possibly a skin tag versus redundant tissue, healing posterior fissure, tender posteriorly  Musculoskeletal:        General: Normal range of motion.  Skin:    General: Skin is warm and dry.  Neurological:      General: No focal deficit present.     Mental Status: She is alert and oriented to person, place, and time.  Psychiatric:        Mood and Affect: Mood normal.        Behavior: Behavior normal.        Thought Content: Thought content normal.        Judgment: Judgment normal.     Results: None  Assessment & Plan:  Kimberly Norris is a 25 y.o. female with some pain and bleeding in her perianal area that is most consistent with the small fissure and she does have some extra tissue anteriorly that could be a tag like area that has developed. No other pathology anteriorly. She is self conscious of this area. She wants to get it removed. Discussed that the pain and bleeding are unlikely from the skin tag area and that the fissure is causing this. Discussed that surgery on the skin tag will just make her discomfort worse.  -Diltiazem /Lidocaine QID for fissure and Sitz baths -Will see back in a few weeks and determine if symptoms improved and if she wants to proceed with excision of the skin tag   Future Appointments  Date Time Provider Department Center  03/17/2020 10:00 AM Letta Median, PA-C [facility]  03/30/2020  9:00 AM [name], MD [facility] None     All questions were answered to the satisfaction of the patient.   [name] 02/17/2020, 1:35 PM

## 2020-02-17 NOTE — Patient Instructions (Signed)
Keep stools soft and regular. Use the ointment up to four times daily. Sitz baths as needed to keep area clean and for comfort.  Will get fissure healed and then discuss skin tag removal.   Anal Fissure, Adult  An anal fissure is a small tear or crack in the tissue of the anus. Bleeding from a fissure usually stops on its own within a few minutes. However, bleeding will often occur again with each bowel movement until the fissure heals. What are the causes? This condition is usually caused by passing a large or hard stool (feces). Other causes include:  Constipation.  Frequent diarrhea.  Inflammatory bowel disease (Crohn's disease or ulcerative colitis).  Childbirth.  Infections.  Anal sex. What are the signs or symptoms? Symptoms of this condition include:  Bleeding from the rectum.  Small amounts of blood seen on your stool, on the toilet paper, or in the toilet after a bowel movement. The blood coats the outside of the stool and is not mixed with the stool.  Painful bowel movements.  Itching or irritation around the anus. How is this diagnosed? A health care provider may diagnose this condition by closely examining the anal area. An anal fissure can usually be seen with careful inspection. In some cases, a rectal exam may be performed, or a short tube (anoscope) may be used to examine the anal canal. How is this treated? Initial treatment for this condition may include:  Taking steps to avoid constipation. This may include making changes to your diet, such as increasing your intake of fiber or fluid.  Taking fiber supplements. These supplements can soften your stool to help make bowel movements easier. Your health care provider may also prescribe a stool softener if your stool is hard.  Taking sitz baths. This may help to heal the tear.  Using medicated creams or ointments. These may be prescribed to lessen discomfort. Treatments that are sometimes used if initial  treatments do not work well or if the condition is more severe may include:  Botulinum injection.  Surgery to repair the fissure. Follow these instructions at home: Eating and drinking  Avoid foods that may cause constipation, such as bananas, milk, and other dairy products.  Eat all fruits, except bananas.  Drink enough fluid to keep your urine pale yellow.  Eat foods that are high in fiber, such as beans, whole grains, and fresh fruits and vegetables.   General instructions  Take over-the-counter and prescription medicines only as told by your health care provider.  Use creams or ointments only as told by your health care provider.  Keep the anal area clean and dry.  Take sitz baths as told by your health care provider. Do not use soap in the sitz baths.  Keep all follow-up visits as told by your health care provider. This is important.   Contact a health care provider if you have:  More bleeding.  A fever.  Diarrhea that is mixed with blood.  Pain that continues.  Ongoing problems that are getting worse rather than better. Summary  An anal fissure is a small tear or crack in the tissue of the anus. This condition is usually caused by passing a large or hard stool (feces). Other causes include constipation and frequent diarrhea.  Initial treatment for this condition may include taking steps to avoid constipation, such as increasing your intake of fiber or fluid.  Follow instructions for care as told by your health care provider.  Contact your health care  provider if you have more bleeding or your problem is getting worse rather than better.  Keep all follow-up visits as told by your health care provider. This is important. This information is not intended to replace advice given to you by your health care provider. Make sure you discuss any questions you have with your health care provider. Document Revised: 06/14/2017 Document Reviewed: 06/14/2017 Elsevier Patient  Education  2021 [employer].

## 2020-03-07 ENCOUNTER — Other Ambulatory Visit: Payer: Self-pay

## 2020-03-07 ENCOUNTER — Encounter: Payer: Self-pay | Admitting: Emergency Medicine

## 2020-03-07 ENCOUNTER — Ambulatory Visit
Admission: EM | Admit: 2020-03-07 | Discharge: 2020-03-07 | Disposition: A | Discharge status: Home / Self Care | Payer: Medicaid Other | Attending: Family Medicine | Admitting: Family Medicine

## 2020-03-07 DIAGNOSIS — N898 Other specified noninflammatory disorders of vagina: Secondary | ICD-10-CM | POA: Diagnosis not present

## 2020-03-07 DIAGNOSIS — Z113 Encounter for screening for infections with a predominantly sexual mode of transmission: Secondary | ICD-10-CM

## 2020-03-07 MED ORDER — MUPIROCIN 2 % EX OINT: 1.0000 "application " | TOPICAL_OINTMENT | Freq: Two times a day (BID) | CUTANEOUS | 0 refills | Status: DC

## 2020-03-07 NOTE — ED Triage Notes (Signed)
States she has a bump close to her vagina states it hurts.  She noticed it on Tuesday.

## 2020-03-07 NOTE — Discharge Instructions (Addendum)
I have sent mupirocin ointment for you to use twice daily as needed  Swab testing will be back in about 2-3 days  Do not have sex until results are back and negative  Follow up with OB as needed

## 2020-03-07 NOTE — ED Provider Notes (Signed)
[facility]    CSN: [id number] Arrival date & time: 03/07/20  2122      History   Chief Complaint No chief complaint on file.   HPI Kimberly Norris is a 25 y.o. female.   Reports pain and swelling to R side of vulva x 3 days. Reports that the area was irritated with sexual contact. Is concerned about STDs. Requesting testing today as well. Denies abnormal vaginal, bleeding, odor, discharge, urinary symptoms.  ROS per HPI  The history is provided by the patient.    Past Medical History:  Diagnosis Date  . Bilateral chronic knee pain 04/16/2012  . Heartburn   . Liver disease   . Mental disorder    anxiety, depression  . Rhabdomyolysis     Patient Active Problem List   Diagnosis Date Noted  . Fissure in ano 02/17/2020  . Skin tag of perianal region 02/17/2020  . Cold sore 12/18/2019  . Screen for STD (sexually transmitted disease) 12/18/2019  . Urinary frequency 09/16/2019  . Vaginal itching 09/16/2019  . Nausea without vomiting 08/18/2019  . Gastroesophageal reflux disease 08/18/2019  . Loss of weight 08/18/2019  . Fatty liver 08/18/2019  . Early satiety 08/18/2019  . Encounter for gynecological examination with Papanicolaou smear of cervix 07/25/2018  . Traumatic rhabdomyolysis (HCC) 07/12/2015  . Rhabdomyolysis 07/12/2015  . BMI (body mass index), pediatric, 95-99% for age 56/16/2014  . 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 Bilateral chronic knee pain 04/16/2012    Past Surgical History:  Procedure Laterality Date  . BIOPSY  09/11/2019   Procedure: BIOPSY;  Surgeon: Lanelle Bal, DO;  [facility]: [facility];  Service: Endoscopy;;  . ESOPHAGOGASTRODUODENOSCOPY (EGD) WITH PROPOFOL N/A 09/11/2019   Procedure: ESOPHAGOGASTRODUODENOSCOPY (EGD) WITH PROPOFOL;  Surgeon: Lanelle Bal, DO;  Esophageal mucosal changes suspicious for eosinophilic esophagitis s/p biopsy, gastritis s/p biopsy, normal examined duodenum biopsy.  All pathology  was benign.     OB History    Gravida  0   Para  0   Term  0   Preterm  0   AB  0   Living  0     SAB  0   IAB  0   Ectopic  0   Multiple  0   Live Births  0            Home Medications    Prior to Admission medications   Medication Sig Start Date End Date Taking? Authorizing Provider  mupirocin ointment (BACTROBAN) 2 % Apply 1 application topically 2 (two) times daily. 03/07/20  Yes Moshe Cipro, NP  diltiazem 2 % GEL Apply 1 application topically in the morning, at noon, in the evening, and at bedtime. 02/17/20   [name], MD  etonogestrel (NEXPLANON) 68 MG IMPL implant Inject 68 mg into the skin once.     [provider]    Family History Family History  Problem Relation Age of Onset  . Autism Sister        1 sister    Social History Social History   Tobacco Use  . Smoking status: Current Every Day Smoker    Packs/day: 0.50    Types: Cigarettes  . Smokeless tobacco: Never Used  . Tobacco comment: smokes 1-2 cig daily  Vaping Use  . Vaping Use: Former  Substance Use Topics  . Alcohol use: No  . Drug use: No     Allergies   Flagyl [metronidazole]   Review  of Systems Review of Systems   Physical Exam Triage Vital Signs ED Triage Vitals [03/07/20 0931]  Enc Vitals Group     BP 115/71     Pulse Rate 87     Resp 18     Temp 98.9 F (37.2 C)     Temp Source Oral     SpO2 98 %     Weight      Height      Head Circumference      Peak Flow      Pain Score 5     Pain Loc      Pain Edu?      Excl. in GC?    No data found.  Updated Vital Signs BP 115/71 (BP Location: Right Arm)   Pulse 87   Temp 98.9 F (37.2 C) (Oral)   Resp 18   SpO2 98%   Visual Acuity Right Eye Distance:   Left Eye Distance:   Bilateral Distance:    Right Eye Near:   Left Eye Near:    Bilateral Near:     Physical Exam Vitals and nursing note reviewed.  Constitutional:      General: She is not in acute distress.     Appearance: She is well-developed and well-nourished.  HENT:     Head: Normocephalic and atraumatic.  Eyes:     Conjunctiva/sclera: Conjunctivae normal.  Cardiovascular:     Rate and Rhythm: Normal rate and regular rhythm.     Heart sounds: No murmur heard.   Pulmonary:     Effort: Pulmonary effort is normal. No respiratory distress.     Breath sounds: Normal breath sounds.  Abdominal:     Palpations: Abdomen is soft.     Tenderness: There is no abdominal tenderness.  Genitourinary:    Labia:        Right: Lesion present.        Comments: Area of erythema consistent with folliculitis  Musculoskeletal:        General: No edema. Normal range of motion.     Cervical back: Neck supple.  Skin:    General: Skin is warm and dry.     Capillary Refill: Capillary refill takes less than 2 seconds.  Neurological:     General: No focal deficit present.     Mental Status: She is alert and oriented to person, place, and time.  Psychiatric:        Mood and Affect: Mood and affect and mood normal.        Behavior: Behavior normal.        Thought Content: Thought content normal.      UC Treatments / Results  Labs (all labs ordered are listed, but only abnormal results are displayed) Labs Reviewed  CERVICOVAGINAL ANCILLARY ONLY    EKG   Radiology No results found.  Procedures Procedures (including critical care time)  Medications Ordered in UC Medications - No data to display  Initial Impression / Assessment and Plan / UC Course  I have reviewed the triage vital signs and the nursing notes.  Pertinent labs & imaging results that were available during my care of the patient were reviewed by me and considered in my medical decision making (see chart for details).    STD screen Vaginal Lesion  Mupirocin prescribed to use topically BID prn for vaginal lesion Vaginal cytology obtained Will be in contact with abnormal results that require further treatment Follow up as  needed  Final Clinical Impressions(s) /  UC Diagnoses   Final diagnoses:  Screen for STD (sexually transmitted disease)  Vaginal lesion     Discharge Instructions     I have sent mupirocin ointment for you to use twice daily as needed  Swab testing will be back in about 2-3 days  Do not have sex until results are back and negative  Follow up with OB as needed      ED Prescriptions    Medication Sig Dispense Auth. Provider   mupirocin ointment (BACTROBAN) 2 % Apply 1 application topically 2 (two) times daily. 22 g Moshe Cipro, NP     PDMP not reviewed this encounter.   Moshe Cipro, NP 03/07/20 1432

## 2020-03-08 LAB — CERVICOVAGINAL ANCILLARY ONLY
Bacterial Vaginitis (gardnerella): NEGATIVE
Candida Glabrata: NEGATIVE
Candida Vaginitis: NEGATIVE
Chlamydia: NEGATIVE
Comment: NEGATIVE
Comment: NEGATIVE
Comment: NEGATIVE
Comment: NEGATIVE
Comment: NEGATIVE
Comment: NORMAL
Neisseria Gonorrhea: NEGATIVE
Trichomonas: NEGATIVE

## 2020-03-11 ENCOUNTER — Ambulatory Visit: Payer: Medicaid Other | Admitting: Adult Health

## 2020-03-15 NOTE — Progress Notes (Signed)
Referring Provider: Alliance, Miguel Aschoff* Primary Care Physician:  Wilmon Pali, FNP Primary GI Physician: Dr. Marletta Lor  Chief Complaint  Patient presents with  . nausea    W/ some vomiting  . Abdominal Pain    RUQ, feels swollen  . diarrhea/constipation    alternates    HPI:   Kimberly Norris is a 25 y.o. female presenting today for follow-up.  She has not been seen since August 2021 at the time of her initial consult.  At that time, she reported mild tenderness to palpation in the right side of her abdomen and felt there was a bulge in this area.  No pain unless pressing on the area.  No symptoms with eating.  Also reported daily GERD symptoms not on a PPI, early satiety, nausea daily without vomiting.  The symptoms worsened with eating.  All symptoms started around October 2020.  Documented 14 pound weight loss in the last 9 months.  No significant lower GI symptoms.  Abdominal exam benign.  Recent RUQ ultrasound in May 2021 with gallbladder and CBD within normal limits, evidence of fatty liver.  LFTs within normal limits in February 2021.  Plan to update CBC, CMP, start Protonix 40 mg daily, counseled on GERD diet/lifestyle, Zofran as needed, EGD for further evaluation.  Also counseled on fatty liver.  Labs completed 08/19/2019 unremarkable.  EGD 09/11/2019: Esophageal mucosal changes suspicious for eosinophilic esophagitis s/p biopsy, gastritis s/p biopsy, normal examined duodenum s/p biopsy.  Esophageal biopsy benign with no increased intraepithelial eosinophils, gastric biopsy benign and negative for H. pylori, duodenal biopsy benign.  Patient has canceled and/or no-show to her follow-up appointments.  Today:  Tried pantoprazole. States it helped with the abdominal pain but made her nausea worse. Stopped after 1 months. Taking tums as needed. Typically 1-2 times a week for heartburn.  Daily nausea, vomited a few times. Not much of an appetite. Early satiety. States she  eats less than her nephew who is 2 y.o.. Continues to eat spicy foods a few times a month. Limiting fired/fatty/greasy foods. Little soda daily. No alcohol since last summer.   RUQ pain/swelling: Feels bloated in RUQ. Sharp pain occurs in the early morning morning hours, 2-3 am, waking her from sleep. Last 30-45 minutes. Resolves on its own. Couple times a week. Associated nausea. Not triggered by eating. Not associated with BMs. Some urinary frequency, no other urinary symptoms.   Diarrhea/constipation: BMs have changed. Stools can be hard or watery. Watery stools a couple times a week with 5 watery BMs that day. Other days it is hard for her to have a BMs. Can't skip days. Doesn't take anything to help with this. Some cramping in the lower abdomen prior to a BM that resolves within about 30 minutes of having a BM. Symptoms started in January. Sharp rectal pain when having a BM. Saw Dr. Henreitta Leber 2/1 and was diagnosed with anal fissure. Was prescribed diltiazem but this was too expensive. Hasn't discussed with Dr. Henreitta Leber. Associated bright red blood on toilet tissue when wiping.   In December she had a respiratory infection.   Occasional tylenol use. No NSAIDs.   Still as Zofran at home.  Weight is stable since August 2021.   Past Medical History:  Diagnosis Date  . Bilateral chronic knee pain 04/16/2012  . Heartburn   . Liver disease   . Mental disorder    anxiety, depression  . Rhabdomyolysis     Past Surgical History:  Procedure Laterality  Date  . BIOPSY  09/11/2019   Procedure: BIOPSY;  Surgeon: Lanelle Bal, DO;  Location: [facility];  Service: Endoscopy;;  . ESOPHAGOGASTRODUODENOSCOPY (EGD) WITH PROPOFOL N/A 09/11/2019   Procedure: ESOPHAGOGASTRODUODENOSCOPY (EGD) WITH PROPOFOL;  Surgeon: Lanelle Bal, DO;  Esophageal mucosal changes suspicious for eosinophilic esophagitis s/p biopsy, gastritis s/p biopsy, normal examined duodenum biopsy.  All pathology was benign.      Current Outpatient Medications  Medication Sig Dispense Refill  . calcium carbonate (TUMS - DOSED IN MG ELEMENTAL CALCIUM) 500 MG chewable tablet Chew 1 tablet by mouth daily. As needed    . etonogestrel (NEXPLANON) 68 MG IMPL implant Inject 68 mg into the skin once.     . Nitroglycerin (RECTIV) 0.4 % OINT Place 1 inch rectally in the morning and at bedtime. 30 g 1  . omeprazole (PRILOSEC) 40 MG capsule Take 1 capsule (40 mg total) by mouth daily before breakfast. 30 capsule 3   No current facility-administered medications for this visit.    Allergies as of 03/17/2020 - Review Complete 03/17/2020  Allergen Reaction Noted  . Flagyl [metronidazole] Rash 03/06/2019    Family History  Problem Relation Age of Onset  . Autism Sister        1 sister    Social History   Socioeconomic History  . Marital status: Single    Spouse name: Not on file  . Number of children: Not on file  . Years of education: Not on file  . Highest education level: Not on file  Occupational History  . Not on file  Tobacco Use  . Smoking status: Current Every Day Smoker    Packs/day: 0.50    Types: Cigarettes  . Smokeless tobacco: Never Used  . Tobacco comment: smokes 1-2 cig daily  Vaping Use  . Vaping Use: Former  Substance and Sexual Activity  . Alcohol use: No  . Drug use: No  . Sexual activity: Yes    Birth control/protection: Implant  Other Topics Concern  . Not on file  Social History Narrative  . Not on file   Social Determinants of Health   Financial Resource Strain: Not on file  Food Insecurity: Not on file  Transportation Needs: Not on file  Physical Activity: Not on file  Stress: Not on file  Social Connections: Not on file    Review of Systems: Gen: Denies fever, chills, cold or flulike symptoms, presyncope, syncope. CV: Denies chest pain or palpitations. Resp: Denies dyspnea or cough. GI: See HPI Heme: See HPI  Physical Exam: BP 123/78   Pulse 74   Temp (!) 97.3  F (36.3 C)   Ht 5\' 1"  (1.549 m)   Wt 187 lb 12.8 oz (85.2 kg)   BMI 35.48 kg/m  General:   Alert and oriented. No distress noted. Pleasant and cooperative.  Head:  Normocephalic and atraumatic. Eyes:  Conjuctiva clear without scleral icterus. Heart:  S1, S2 present without murmurs appreciated. Lungs:  Clear to auscultation bilaterally. No wheezes, rales, or rhonchi. No distress.  Abdomen:  +BS, soft, and non-distended.  Mild TTP in RUQ along inferior border of ribs.  No rebound or guarding. No HSM or masses noted. Back: No CVA tenderness.  Msk:  Symmetrical without gross deformities. Normal posture. Extremities:  Without edema. Neurologic:  Alert and  oriented x4 Psych: Normal mood and affect.  Assessment: 25 year old female with history of heartburn and fatty liver presenting today for follow-up of multiple GI complaints RUQ abdominal pain, GERD,  nausea with vomiting, early satiety, alternating constipation and diarrhea, anal fissure, and also reporting urinary frequency.    Upper GI symptoms were previously evaluated with RUQ ultrasound May 2021 with gallbladder and CBD within normal limits, evidence of fatty liver.  CBC and CMP within normal limits August 2021.  EGD 09/11/2019 with esophagitis, gastritis, and normal examined duodenum.  She was to take PPI daily, but she has not continued with this.  Reports she took Protonix for about 1 month; it did help with abdominal pain, but worsened her nausea. In general, she feels her RUQ abdominal pain is worsening, waking up with sharp pain and associated nausea lasting 30-45 minutes a couple times a week, rare vomiting.  RUQ pain is not triggered by meals it is not affected by bowel movements.  Also with ongoing decreased appetite/early satiety, daily nausea in general, and heartburn symptoms a couple times a week.  No NSAIDs. Denies melena. Intermittent toilet tissue hematochezia in the setting of anal fissure discussed below.  She is very  concerned about her liver.   Differentials include ongoing esophagitis and gastritis secondary to uncontrolled GERD, gastroparesis.  Symptoms do not seem consistent with biliary etiology.  Patient is very concerned about her liver and is interested in imaging again. Will update [place], labs, and start omeprazole. If ongoing symptoms, consider HIDA, GES, and/or CT.   Alternating constipation and diarrhea: Alternating constipation and diarrhea started in January 2022.  Had been struggling with some constipation, but now with loose, watery BMs a couple times a week which then alternates with hard stools or no BM.  Associated lower abdominal pain prior to bowel movements that resolves within about 30 minutes.  Had respiratory infection in December.  Also with intermittent toilet tissue hematochezia and recently diagnosed with anal fissure by Dr. Henreitta Leber on 2/1.   Differentials include baseline constipation with overflow diarrhea, post infectious IBS. Not consistent with infectious diarrhea. Will also check for celiac disease and thyroid disease.   Anal fissure: Small, posterior midline anal fissure in the setting of alternating constipation and diarrhea.  Associated toilet tissue hematochezia and some rectal pain with BMs.  Previously saw Dr. Henreitta Leber on 2/1 and was prescribed diltiazem compound ointment.  Patient was unable to afford this and has not reached back out to Dr. Henreitta Leber.  We will try her on Rectiv twice daily.  Urinary frequency: No associated dysuria, hematuria.  Will order UA and urine culture.   Fatty liver: Discussed this in length again today. Counseled on importance of weight loss through diet and exercise.    Plan:  1.  CBC, CMP, lipase, IgA, TTG IgA, TSH, UA, urine culture 2.  RUQ ultrasound. 3.  Start omeprazole 40 mg daily 30 minutes for breakfast. 4.   Zofran 4 mg every 8 hours as needed for nausea and vomiting (patient has this at home) 5.  Counseled on GERD diet/lifestyle.   Written instructions provided. 6.  Rectiv 0.4% rectally BID. Goal of continuing medication 2 weeks past symptom resolution.   Recommended laying down when applying medication as it may cause some lightheadedness. 7.  Sitz baths BID.  8.  Add Benefiber or Metamucil daily to help with bowel regularity. 9.  Avoid straining 10. Keep follow-up with Dr. Henreitta Leber.  11.  Counseled on fatty liver. Written instructions provided.  12.  Follow-up in 4-6 weeks.    Ermalinda Memos, PA-C [facility] 03/17/20

## 2020-03-17 ENCOUNTER — Other Ambulatory Visit: Payer: Self-pay

## 2020-03-17 ENCOUNTER — Encounter: Payer: Self-pay | Admitting: Gastroenterology

## 2020-03-17 ENCOUNTER — Ambulatory Visit: Payer: Medicaid Other | Admitting: Gastroenterology

## 2020-03-17 VITALS — BP 123/78 | HR 74 | Temp 97.3°F | Ht 61.0 in | Wt 187.8 lb

## 2020-03-17 DIAGNOSIS — R6881 Early satiety: Secondary | ICD-10-CM

## 2020-03-17 DIAGNOSIS — K760 Fatty (change of) liver, not elsewhere classified: Secondary | ICD-10-CM | POA: Diagnosis not present

## 2020-03-17 DIAGNOSIS — R112 Nausea with vomiting, unspecified: Secondary | ICD-10-CM

## 2020-03-17 DIAGNOSIS — R1011 Right upper quadrant pain: Secondary | ICD-10-CM | POA: Diagnosis not present

## 2020-03-17 DIAGNOSIS — K602 Anal fissure, unspecified: Secondary | ICD-10-CM

## 2020-03-17 DIAGNOSIS — R198 Other specified symptoms and signs involving the digestive system and abdomen: Secondary | ICD-10-CM

## 2020-03-17 DIAGNOSIS — R350 Frequency of micturition: Secondary | ICD-10-CM

## 2020-03-17 DIAGNOSIS — K2100 Gastro-esophageal reflux disease with esophagitis, without bleeding: Secondary | ICD-10-CM

## 2020-03-17 MED ORDER — OMEPRAZOLE 40 MG PO CPDR: 40.0000 mg | DELAYED_RELEASE_CAPSULE | Freq: Every day | ORAL | 3 refills | Status: DC

## 2020-03-17 MED ORDER — RECTIV 0.4 % RE OINT: 1.0000 [in_us] | TOPICAL_OINTMENT | Freq: Two times a day (BID) | RECTAL | 1 refills | Status: DC

## 2020-03-17 NOTE — Patient Instructions (Addendum)
Please have labs completed at [facility].  Have ultrasound completed at [facility].  Start omeprazole 40 mg daily 30 minutes before breakfast.  Be sure to take this medication every day.  Use Zofran 4 mg every 8 hours as needed for nausea and vomiting.  Follow a GERD diet:  Avoid fried, fatty, greasy, spicy, citrus foods. Avoid caffeine and carbonated beverages. Avoid chocolate. Try eating 4-6 small meals a day rather than 3 large meals. Do not eat within 3 hours of laying down. Prop head of bed up on wood or bricks to create a 6 inch incline.  For anal fissure: Apply 1 inch of Rectiv to your rectum twice daily. I have sent a prescription to your pharmacy. We will plan to continue this medication for addition 2 weeks after your symptoms have resolved.  *Recommend you lay down when applying Rectiv as it can cause some lightheadedness.  Avoid straining.  Sitz baths twice daily.  Keep follow-up with Dr. Henreitta Leber for rectal fissure.   Add Benefiber or Metamucil daily to help with bowel regularity. Follow- instructions on container.   Instructions for fatty liver: Recommend 1-2# weight loss per week until ideal body weight through exercise & diet. Low fat/cholesterol diet.   Avoid sweets, sodas, fruit juices, sweetened beverages like tea, etc. Gradually increase exercise from 15 min daily up to 1 hr per day 5 days/week. Limit alcohol use.  We will see you back in 4-6 weeks for follow-up.   Ermalinda Memos, PA-C [facility]

## 2020-03-17 NOTE — Progress Notes (Signed)
Cc'ed to pcp °

## 2020-03-19 LAB — URINALYSIS
Bilirubin Urine: NEGATIVE
Glucose, UA: NEGATIVE
Hgb urine dipstick: NEGATIVE
Ketones, ur: NEGATIVE
Leukocytes,Ua: NEGATIVE
Nitrite: NEGATIVE
Protein, ur: NEGATIVE
Specific Gravity, Urine: 1.006 (ref 1.001–1.03)
pH: 7.5 (ref 5.0–8.0)

## 2020-03-19 LAB — CBC WITH DIFFERENTIAL/PLATELET
Absolute Monocytes: 700 cells/uL (ref 200–950)
Basophils Absolute: 52 cells/uL (ref 0–200)
Basophils Relative: 0.5 %
Eosinophils Absolute: 381 cells/uL (ref 15–500)
Eosinophils Relative: 3.7 %
HCT: 39.7 % (ref 35.0–45.0)
Hemoglobin: 13.5 g/dL (ref 11.7–15.5)
Lymphs Abs: 3358 cells/uL (ref 850–3900)
MCH: 32.4 pg (ref 27.0–33.0)
MCHC: 34 g/dL (ref 32.0–36.0)
MCV: 95.2 fL (ref 80.0–100.0)
MPV: 11 fL (ref 7.5–12.5)
Monocytes Relative: 6.8 %
Neutro Abs: 5809 cells/uL (ref 1500–7800)
Neutrophils Relative %: 56.4 %
Platelets: 356 10*3/uL (ref 140–400)
RBC: 4.17 10*6/uL (ref 3.80–5.10)
RDW: 11.5 % (ref 11.0–15.0)
Total Lymphocyte: 32.6 %
WBC: 10.3 10*3/uL (ref 3.8–10.8)

## 2020-03-19 LAB — COMPLETE METABOLIC PANEL WITH GFR
AG Ratio: 1.7 (calc) (ref 1.0–2.5)
ALT: 19 U/L (ref 6–29)
AST: 14 U/L (ref 10–30)
Albumin: 4.3 g/dL (ref 3.6–5.1)
Alkaline phosphatase (APISO): 64 U/L (ref 31–125)
BUN: 10 mg/dL (ref 7–25)
CO2: 27 mmol/L (ref 20–32)
Calcium: 9.6 mg/dL (ref 8.6–10.2)
Chloride: 107 mmol/L (ref 98–110)
Creat: 0.68 mg/dL (ref 0.50–1.10)
GFR, Est African American: 144 mL/min/{1.73_m2} (ref >=60)
GFR, Est Non African American: 124 mL/min/{1.73_m2} (ref >=60)
Globulin: 2.5 g/dL (calc) (ref 1.9–3.7)
Glucose, Bld: 82 mg/dL (ref 65–99)
Potassium: 4.6 mmol/L (ref 3.5–5.3)
Sodium: 140 mmol/L (ref 135–146)
Total Bilirubin: 0.3 mg/dL (ref 0.2–1.2)
Total Protein: 6.8 g/dL (ref 6.1–8.1)

## 2020-03-19 LAB — LIPASE: Lipase: 14 U/L (ref 7–60)

## 2020-03-19 LAB — IGA: Immunoglobulin A: 109 mg/dL (ref 47–310)

## 2020-03-19 LAB — TISSUE TRANSGLUTAMINASE, IGA: (tTG) Ab, IgA: <1 U/mL

## 2020-03-19 LAB — URINE CULTURE
MICRO NUMBER:: 11604050
SPECIMEN QUALITY:: ADEQUATE

## 2020-03-19 LAB — TSH: TSH: 2.67 mIU/L

## 2020-03-23 ENCOUNTER — Other Ambulatory Visit: Payer: Self-pay

## 2020-03-23 ENCOUNTER — Ambulatory Visit (HOSPITAL_COMMUNITY)
Admission: RE | Admit: 2020-03-23 | Discharge: 2020-03-23 | Disposition: A | Discharge status: Home / Self Care | Payer: Medicaid Other | Source: Ambulatory Visit | Attending: Gastroenterology | Admitting: Gastroenterology

## 2020-03-23 DIAGNOSIS — R1011 Right upper quadrant pain: Secondary | ICD-10-CM | POA: Diagnosis not present

## 2020-03-30 ENCOUNTER — Ambulatory Visit: Payer: Medicaid Other | Admitting: General Surgery

## 2020-04-13 ENCOUNTER — Ambulatory Visit: Payer: Medicaid Other | Admitting: General Surgery

## 2020-04-28 NOTE — Progress Notes (Signed)
Referring Provider: Wilmon Pali, FNP Primary Care Physician:  Kimberly Pali, FNP Primary GI Physician: Dr. Marletta Norris Chief Complaint  Patient presents with  . Gastroesophageal Reflux    Lot of heartburn but unable to take tums bc wisdom tooth removed last week  . Abdominal Pain    RUQ, ongoing    HPI:   Kimberly Norris is a 25 y.o. female presenting today for follow-up of fatty liver, RUQ abdominal pain, GERD, early satiety, nausea with vomiting, alternating constipation diarrhea, and anal fissure.  Last seen in our office 03/17/2020 for the same.    Prior evaluation has included RUQ ultrasound in May 2021 with gallbladder and CBD within normal limits, evidence of fatty liver.  CBC, CMP unremarkable in August 2021.  EGD August 2021 with esophageal mucosal changes suspicious for EOE though biopsy was benign, gastritis with benign biopsy negative for H. pylori, normal examined duodenum with benign biopsy.  At the time of her last office visit in March, she reported trying pantoprazole which helped her abdominal pain but made her nausea worse and stopped after 1 month.  She reported worsening RUQ abdominal pain, waking up with sharp pain with associated nausea lasting 30 to 45 minutes a couple times a week, rare vomiting.  RUQ pain is not triggered by meals and not affected by bowel movements.  Also with ongoing decreased appetite, early satiety, daily nausea, and heartburn symptoms a couple times a week.  Denies NSAIDs.  Denies melena.  Noted intermittent toilet tissue hematochezia in the setting of anal fissure, previously seen by Dr. Henreitta Norris.  Differential for upper GI symptoms included gastritis, duodenitis, uncontrolled GERD, gastroparesis.  Plan to start omeprazole, update labs, obtain RUQ ultrasound.  If persistent symptoms, consider HIDA, GES, CT.  Also with alternating constipation and diarrhea with loose, watery BMs a couple times a week and alternating to hard stools or no BM.   Associated lower abdominal pain prior to bowel movements that resolves thereafter.  Plan to check for celiac disease and thyroid abnormalities.  Regarding anal fissure, she has not been able to afford compounded diltiazem prescribed by Dr. Henreitta Norris, so I prescribed Rectiv twice daily.  We also discussed her fatty liver again in detail as patient was very concerned about this.  Labs completed 03/18/20 with CBC, CMP, lipase, TSH, celiac testing all unremarkable.  UA and urine culture negative. RUQ ultrasound 03/23/2020: No cholelithiasis, CBD normal, evidence of fatty liver.  Patient no showed her follow-up appointment with Dr. Henreitta Norris.  Today:  RUQ abdominal pain: Somewhat improved, but still there after eating. Usually occurs when eating a lighter meal.  Feels "bloated and like something is poking her.  Continues with early satiety.  Nausea has improved but continues most every day.  Usually occurs when waking in the morning.  Vomiting has resolved.  GERD symptoms have also improved, but she still gets burning in her esophagus/throat daily.  She is taking omeprazole 40 mg daily before breakfast.  Occasional dysphagia to chicken, but this is not routine.  Has been patient.  She will drink a lot of water in the past.  She had not been taking any NSAIDs.  She did get her recently filled last week and is taking ibuprofen as needed.  She is also finishing up a course of amoxicillin in a couple of days.  Bowels are moving well.  Alternating constipation and diarrhea have resolved.  Denies melena.  Occasional rectal pain or blood on the tissue hematochezia though  much improved with rest Kimberly Norris.  This occurs maybe once a week and pain is not as sharp.  She has rescheduled her appointment with Dr. Henreitta Norris on 4/28.  Patient reports a bulge just to the right of her umbilicus that she is concerned about.  This has been present for more than 6 months, but she feels it has gotten worse.    On exam, there is mild  tenderness to this area, but I cannot appreciate an identifiable mass or hernia.  I reviewed prior ultrasounds and CT from 2018 with Dr. Tyron Norris.  No evidence of mass or hernia noted on prior CT.  Ultrasound did not go down far enough to see the affected area.  Dr. Tyron Norris recommended CT abdomen without contrast.  Past Medical History:  Diagnosis Date  . Bilateral chronic knee pain 04/16/2012  . Heartburn   . Liver disease    fatty liver  . Mental disorder    anxiety, depression  . Rhabdomyolysis     Past Surgical History:  Procedure Laterality Date  . BIOPSY  09/11/2019   Procedure: BIOPSY;  Surgeon: Kimberly Bal, DO;  Location: [facility];  Service: Endoscopy;;  . ESOPHAGOGASTRODUODENOSCOPY (EGD) WITH PROPOFOL N/A 09/11/2019   Procedure: ESOPHAGOGASTRODUODENOSCOPY (EGD) WITH PROPOFOL;  Surgeon: Kimberly Bal, DO;  Esophageal mucosal changes suspicious for eosinophilic esophagitis s/p biopsy, gastritis s/p biopsy, normal examined duodenum biopsy.  All pathology was benign.     Current Outpatient Medications  Medication Sig Dispense Refill  . calcium carbonate (TUMS - DOSED IN MG ELEMENTAL CALCIUM) 500 MG chewable tablet Chew 1 tablet by mouth daily. As needed    . etonogestrel (NEXPLANON) 68 MG IMPL implant Inject 68 mg into the skin once.     Marland Kitchen ibuprofen (ADVIL) 200 MG tablet Take 200 mg by mouth every 6 (six) hours as needed.    . Nitroglycerin (RECTIV) 0.4 % OINT Place 1 inch rectally in the morning and at bedtime. (Patient taking differently: Place 1 inch rectally in the morning and at bedtime. As needed) 30 g 1  . omeprazole (PRILOSEC) 40 MG capsule Take 1 capsule (40 mg total) by mouth 2 (two) times daily before a meal. 60 capsule 3   No current facility-administered medications for this visit.    Allergies as of 04/29/2020 - Review Complete 04/29/2020  Allergen Reaction Noted  . Flagyl [metronidazole] Rash 03/06/2019    Family History  Problem Relation Age of Onset   . Autism Sister        1 sister    Social History   Socioeconomic History  . Marital status: Single    Spouse name: Not on file  . Number of children: Not on file  . Years of education: Not on file  . Highest education level: Not on file  Occupational History  . Not on file  Tobacco Use  . Smoking status: Current Every Day Smoker    Packs/day: 0.50    Types: Cigarettes  . Smokeless tobacco: Never Used  . Tobacco comment: smokes 1-2 cig daily  Vaping Use  . Vaping Use: Former  Substance and Sexual Activity  . Alcohol use: No  . Drug use: No  . Sexual activity: Yes    Birth control/protection: Implant  Other Topics Concern  . Not on file  Social History Narrative  . Not on file   Social Determinants of Health   Financial Resource Strain: Not on file  Food Insecurity: Not on file  Transportation Needs: Not  on file  Physical Activity: Not on file  Stress: Not on file  Social Connections: Not on file    Review of Systems: Gen: Denies fever, chills, cold or flulike symptoms, presyncope, syncope. CV: Denies chest pain or palpitations Resp: Denies dyspnea or cough.  GI: See HPI Heme: See HPI  Physical Exam: BP 118/77   Pulse 83   Temp (!) 97 F (36.1 C)   Ht 5\' 1"  (1.549 m)   Wt 185 lb 9.6 oz (84.2 kg)   BMI 35.07 kg/m  General: Alert and oriented. No distress noted. Pleasant and cooperative.  Head:  Normocephalic and atraumatic. Eyes:  Conjuctiva clear without scleral icterus. Heart:  S1, S2 present without murmurs appreciated. Lungs:  Clear to auscultation bilaterally. No wheezes, rales, or rhonchi. No distress.  Abdomen:  +BS, soft, and non-distended. Mild TTP in the epigastric area. When standing, the abdominal wall appears asymmetric at the level of the umbilicus with the right side being more prominent.  While standing and laying on exam table, I cannot appreciate an identifiable mass or hernia.  She does report mild tenderness to palpation of this area.   No rebound or guarding. No HSM or masses noted. Msk:  Symmetrical without gross deformities. Normal posture. Extremities:  Without edema. Neurologic:  Alert and  oriented x4 Psych:  Normal mood and affect.   Assessment: 25 year old female presenting today for follow-up of multiple GI symptoms including RUQ abdominal pain, GERD, early satiety, nausea with vomiting, alternating constipation diarrhea, anal fissure, and also reporting an abdominal wall bulge on the right side of her umbilicus.  Prior evaluation has included RUQ ultrasound with gallbladder and CBD within normal, evidence of fatty liver, labs including CBC, CMP, TSH, TTG IgA, IgA total unremarkable, EGD with esophageal mucosal changes suspicious for EOE though biopsy was benign, gastritis with benign biopsy negative for H. pylori, normal examined duodenum with benign biopsy.  She was started on omeprazole 40 mg daily at her last visit on 03/17/2020 and notes some improvement in her upper GI symptoms, but they still persist, occurring most days but to a milder degree.  RUQ abdominal pain occurs when eating larger meals.  Nausea tends to occur in the morning, vomiting has resolved.  Continues with early satiety. GERD improved, but with burning in her esophagus/throat daily.  Also reports occasional dysphagia though this is fairly rare and without regurgitation.  She has been avoiding NSAIDs aside from getting her wisdom teeth pulled recently.  Continues to avoid alcohol.  Suspect upper GI symptoms are likely secondary to gastritis and uncontrolled GERD.  However, with persistent early satiety, will proceed GES to evaluate for gastroparesis.  We will also increase her omeprazole to 40 mg twice daily.  Dysphagia may be secondary to esophagitis in the setting of uncontrolled GERD.  We will hold off on any further evaluation of dysphagia in hopes that this improves with better management of GERD symptoms.  Alternating constipation and diarrhea:  Self resolved. Advised to monitor for now.   Anal fissure with low-volume toilet tissue hematochezia: Patient originally saw Dr. 05/17/2020 for anal fissure.  Per Dr. Henreitta Norris exam, she had a small, posterior midline anal fissure.  She had been prescribed diltiazem compound ointment, but she was not able to afford this.  I started her on Rectiv twice daily on 03/17/2020.  Since that time, her symptoms are improving.  Rectal pain and low-volume toilet tissue hematochezia about once a week, and symptoms are less severe when they occur.  Previously reported alternating constipation and diarrhea also resolved.  I have advised that she continue Rectiv twice daily and add Benefiber 2 teaspoons daily x2 weeks then increase to twice daily to help with bowel regularity. Patient also has follow-up with Dr. Henreitta LeberBridges on 4/28.   Abdominal wall bulge: Patient reports an abdominal wall bulge to the right of her umbilicus that has been present for 6+ months though she feels it has gotten larger.  When lying flat on exam table, her abdomen appears symmetrical.  She does have mild tenderness just to the right of her umbilicus, but I am unable to appreciate any mass or hernia.  When standing, do appreciate mild asymmetry between the right and left abdomen adjacent to the umbilicus, but again, I am not able to appreciate a mass or hernia.  After patient's visit, I reviewed her recent imaging studies with Dr. Tyron RussellBoles.  Recent ultrasounds do not reach the affected area.  Dr. Tyron RussellBoles reviewed CT from 2018, and did not see any evidence of mass, hernia, or lipoma.  Also states her abdominal wall subcutaneous fat was symmetric at that time.  He recommended CT abdomen without contrast for further evaluation. Patient was updated and is agreeable to proceed with CT.    Plan:  1.  Gastric emptying study. 2.  Increase omeprazole to 40 mg twice daily 30 minutes before breakfast and dinner. 3.  Counseled on GERD diet/lifestyle.  Written  instructions were provided. 4.  CT abdomen without contrast to evaluate abdominal wall bulge. 5.  Continue Rectiv rectal ointment twice daily. 6.  Start Benefiber 3 teaspoons daily x2 weeks then increase to twice daily. 7.  Keep follow-up with Dr. Henreitta LeberBridges. 8.  Follow-up in 8 weeks.    Kimberly MemosKristen Deloris Mittag, PA-C [facility] 04/29/2020

## 2020-04-29 ENCOUNTER — Ambulatory Visit: Payer: Medicaid Other | Admitting: Gastroenterology

## 2020-04-29 ENCOUNTER — Telehealth: Payer: Self-pay | Admitting: Gastroenterology

## 2020-04-29 ENCOUNTER — Other Ambulatory Visit: Payer: Self-pay

## 2020-04-29 ENCOUNTER — Encounter: Payer: Self-pay | Admitting: Gastroenterology

## 2020-04-29 VITALS — BP 118/77 | HR 83 | Temp 97.0°F | Ht 61.0 in | Wt 185.6 lb

## 2020-04-29 DIAGNOSIS — R1310 Dysphagia, unspecified: Secondary | ICD-10-CM | POA: Insufficient documentation

## 2020-04-29 DIAGNOSIS — R1900 Intra-abdominal and pelvic swelling, mass and lump, unspecified site: Secondary | ICD-10-CM

## 2020-04-29 DIAGNOSIS — R1011 Right upper quadrant pain: Secondary | ICD-10-CM | POA: Diagnosis not present

## 2020-04-29 DIAGNOSIS — K2100 Gastro-esophageal reflux disease with esophagitis, without bleeding: Secondary | ICD-10-CM | POA: Diagnosis not present

## 2020-04-29 DIAGNOSIS — R110 Nausea: Secondary | ICD-10-CM | POA: Diagnosis not present

## 2020-04-29 DIAGNOSIS — K602 Anal fissure, unspecified: Secondary | ICD-10-CM | POA: Diagnosis not present

## 2020-04-29 DIAGNOSIS — R6881 Early satiety: Secondary | ICD-10-CM

## 2020-04-29 MED ORDER — OMEPRAZOLE 40 MG PO CPDR: 40.0000 mg | DELAYED_RELEASE_CAPSULE | Freq: Two times a day (BID) | ORAL | 3 refills | Status: DC

## 2020-04-29 NOTE — Telephone Encounter (Signed)
Will submit PA for CT when OV note is completed.

## 2020-04-29 NOTE — Patient Instructions (Addendum)
Increase omeprazole to 40 mg twice daily 30 minutes before breakfast and dinner.  I have sent a new prescription to your pharmacy.  Follow a GERD diet:  Avoid fried, fatty, greasy, spicy, citrus foods. Avoid caffeine and carbonated beverages. Avoid chocolate. Try eating 4-6 small meals a day rather than 3 large meals. Do not eat within 3 hours of laying down. Prop head of bed up on wood or bricks to create a 6 inch incline.  Please have gastric emptying study completed at [facility].  We will arrange for this.  I am going to discuss your abdominal wall bulge with radiology. Further recommendations to follow.   Continue Rectiv twice daily.   Add Benefiber 2 teaspoons daily x2 weeks then increase to twice daily.   We will see you back in 8 weeks.   Ermalinda Memos, PA-C [facility]

## 2020-04-29 NOTE — Telephone Encounter (Signed)
Spoke with patient. She is aware of recommendations and ok with proceeding with CT.   [facility] Clinical Pool FYI

## 2020-04-29 NOTE — Addendum Note (Signed)
Addended by: Sandria Senter C on: 04/29/2020 01:26 PM   Modules accepted: Orders

## 2020-04-29 NOTE — Telephone Encounter (Signed)
PATIENT CALLED AND SAID SHE WAS RETURNING YOUR CALL. PLEASE CALL BACK

## 2020-04-29 NOTE — Telephone Encounter (Signed)
At patient's office visit today, she reported an increasing abdominal bulge just to the right of her umbilicus.  On exam, there was mild tenderness to palpation, but no appreciable mass or hernia.  When standing, it does appear that her abdominal wall just to the right of her umbilicus is more prominent than to the left of her umbilicus.  Reviewed recent ultrasounds and CT from 2018 with Dr. Tyron Russell.  Ultrasounds were limited to RUQ and unable to see the affected area.  Dr. Tyron Russell stated her subcutaneous fat was symmetrical in 2018, no lipoma, fascial defect, hernia, mass, or any other significant abnormality in that area.  He recommended CT abdomen without contrast for further evaluation.  I tried to reach patient to let her know we can proceed with CT for further evaluation, but I was unable to reach her.  Left message on machine requesting return call.  [facility] Clinical Pool: Please let patient know the above and arrange CT abdomen without contrast ASAP. Dx: abdominal wall bulge. Please write in comments: Abdominal wall bulge to the right of the umbilicus, mild tenderness to palpation.

## 2020-04-29 NOTE — Telephone Encounter (Signed)
Spoke with patient. See other phone note from earlier today for details.

## 2020-04-30 ENCOUNTER — Encounter: Payer: Self-pay | Admitting: Gastroenterology

## 2020-05-03 NOTE — Telephone Encounter (Signed)
PA for CT submitted via [employer] website. Case pending. Request tracking ID: [id number].

## 2020-05-04 NOTE — Telephone Encounter (Signed)
Received fax from [facility], CT approved. Reference# [id number], valid 05/03/20-07/02/20.  CT scheduled for 05/05/20 at 5:00pm, arrive at 4:45pm. Pickup contrast today. NPO 4 hours prior to test.  Tried to call pt, LMOVM to inform her appt info would be sent to MyChart. CT appt info sent to her in MyChart.

## 2020-05-04 NOTE — Telephone Encounter (Signed)
CT approved. Reference# [id number], valid 05/03/20-07/02/20.

## 2020-05-05 ENCOUNTER — Other Ambulatory Visit: Payer: Self-pay

## 2020-05-05 ENCOUNTER — Ambulatory Visit (HOSPITAL_COMMUNITY)
Admission: RE | Admit: 2020-05-05 | Discharge: 2020-05-05 | Disposition: A | Discharge status: Home / Self Care | Payer: Medicaid Other | Source: Ambulatory Visit | Attending: Gastroenterology | Admitting: Gastroenterology

## 2020-05-05 DIAGNOSIS — R1900 Intra-abdominal and pelvic swelling, mass and lump, unspecified site: Secondary | ICD-10-CM | POA: Diagnosis not present

## 2020-05-05 MED ORDER — IOHEXOL 9 MG/ML PO SOLN
ORAL | Status: AC
  Filled 2020-05-05: qty 1000

## 2020-05-08 ENCOUNTER — Encounter: Payer: Self-pay | Admitting: Emergency Medicine

## 2020-05-08 ENCOUNTER — Ambulatory Visit
Admission: EM | Admit: 2020-05-08 | Discharge: 2020-05-08 | Disposition: A | Discharge status: Home / Self Care | Payer: Medicaid Other | Attending: Family Medicine | Admitting: Family Medicine

## 2020-05-08 DIAGNOSIS — J039 Acute tonsillitis, unspecified: Secondary | ICD-10-CM

## 2020-05-08 LAB — POCT RAPID STREP A (OFFICE): Rapid Strep A Screen: NEGATIVE

## 2020-05-08 MED ORDER — AMOXICILLIN-POT CLAVULANATE 875-125 MG PO TABS
1.0000 | ORAL_TABLET | Freq: Two times a day (BID) | ORAL | 0 refills | Status: AC
Start: 2020-05-08 — End: 2020-05-15

## 2020-05-08 MED ORDER — FLUCONAZOLE 150 MG PO TABS: ORAL_TABLET | ORAL | 0 refills | Status: DC

## 2020-05-08 NOTE — Discharge Instructions (Addendum)
I have sent in Augmentin for you to take twice a day for 7 days.  Follow up with this office or with primary care if symptoms are persisting.  Follow up in the ER for high fever, trouble swallowing, trouble breathing, other concerning symptoms.;u

## 2020-05-08 NOTE — ED Triage Notes (Signed)
Sore throat for about 2 days.  No fevers.

## 2020-05-10 ENCOUNTER — Encounter (HOSPITAL_COMMUNITY): Payer: Self-pay

## 2020-05-10 ENCOUNTER — Encounter (HOSPITAL_COMMUNITY): Payer: Medicaid Other

## 2020-05-13 ENCOUNTER — Ambulatory Visit: Payer: Medicaid Other | Admitting: General Surgery

## 2020-05-14 NOTE — ED Provider Notes (Signed)
[facility]    CSN: [id number] Arrival date & time: 05/08/20  1421      History   Chief Complaint Chief Complaint  Patient presents with  . Sore Throat    HPI Kimberly Norris is a 25 y.o. female.   Reports sore throat for the last 2 days. Denies sick contacts. Has positive hx Covid. Has not completed Covid vaccines. Has not completed flu vaccine this year. Reports that pain is worse with swallowing. Tolerating secretions well. Denies headache, cough, abdominal pain, nausea, vomiting, diarrhea, rash, fever, other symptoms.  ROS per HPI  The history is provided by the patient.  Sore Throat    Past Medical History:  Diagnosis Date  . Bilateral chronic knee pain 04/16/2012  . Heartburn   . Liver disease    fatty liver  . Mental disorder    anxiety, depression  . Rhabdomyolysis     Patient Active Problem List   Diagnosis Date Noted  . Dysphagia 04/29/2020  . Abdominal wall bulge 04/29/2020  . Alternating constipation and diarrhea 03/17/2020  . RUQ abdominal pain 03/17/2020  . Anal fissure 02/17/2020  . Skin tag of perianal region 02/17/2020  . Cold sore 12/18/2019  . Screen for STD (sexually transmitted disease) 12/18/2019  . Urinary frequency 09/16/2019  . Vaginal itching 09/16/2019  . Nausea without vomiting 08/18/2019  . Gastroesophageal reflux disease 08/18/2019  . Loss of weight 08/18/2019  . Fatty liver 08/18/2019  . Early satiety 08/18/2019  . Encounter for gynecological examination with Papanicolaou smear of cervix 07/25/2018  . Traumatic rhabdomyolysis (HCC) 07/12/2015  . Rhabdomyolysis 07/12/2015  . BMI (body mass index), pediatric, 95-99% for age 38/16/2014  . 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 Bilateral chronic knee pain 04/16/2012    Past Surgical History:  Procedure Laterality Date  . BIOPSY  09/11/2019   Procedure: BIOPSY;  Surgeon: Lanelle Bal, DO;  [facility];  Service: Endoscopy;;  .  ESOPHAGOGASTRODUODENOSCOPY (EGD) WITH PROPOFOL N/A 09/11/2019   Procedure: ESOPHAGOGASTRODUODENOSCOPY (EGD) WITH PROPOFOL;  Surgeon: Lanelle Bal, DO;  Esophageal mucosal changes suspicious for eosinophilic esophagitis s/p biopsy, gastritis s/p biopsy, normal examined duodenum biopsy.  All pathology was benign.     OB History    Gravida  0   Para  0   Term  0   Preterm  0   AB  0   Living  0     SAB  0   IAB  0   Ectopic  0   Multiple  0   Live Births  0            Home Medications    Prior to Admission medications   Medication Sig Start Date End Date Taking? Authorizing Provider  amoxicillin-clavulanate (AUGMENTIN) 875-125 MG tablet Take 1 tablet by mouth 2 (two) times daily for 7 days. 05/08/20 05/15/20 Yes Moshe Cipro, NP  fluconazole (DIFLUCAN) 150 MG tablet Take one tablet at the onset of symptoms. If symptoms are still present 3 days later, take the second tablet. 05/08/20  Yes Moshe Cipro, NP  calcium carbonate (TUMS - DOSED IN MG ELEMENTAL CALCIUM) 500 MG chewable tablet Chew 1 tablet by mouth daily. As needed    [provider]  etonogestrel (NEXPLANON) 68 MG IMPL implant Inject 68 mg into the skin once.     [provider]  ibuprofen (ADVIL) 200 MG tablet Take 200 mg by mouth every 6 (six) hours as needed.  [provider]  Nitroglycerin (RECTIV) 0.4 % OINT Place 1 inch rectally in the morning and at bedtime. Patient taking differently: Place 1 inch rectally in the morning and at bedtime. As needed 03/17/20   Letta Median, PA-C  omeprazole (PRILOSEC) 40 MG capsule Take 1 capsule (40 mg total) by mouth 2 (two) times daily before a meal. 04/29/20   Letta Median, PA-C    Family History Family History  Problem Relation Age of Onset  . Autism Sister        1 sister    Social History Social History   Tobacco Use  . Smoking status: Current Every Day Smoker    Packs/day: 0.50    Types: Cigarettes  .  Smokeless tobacco: Never Used  . Tobacco comment: smokes 1-2 cig daily  Vaping Use  . Vaping Use: Former  Substance Use Topics  . Alcohol use: No  . Drug use: No     Allergies   Flagyl [metronidazole]   Review of Systems Review of Systems   Physical Exam Triage Vital Signs ED Triage Vitals  Enc Vitals Group     BP 05/08/20 1504 118/75     Pulse Rate 05/08/20 1504 82     Resp 05/08/20 1504 16     Temp 05/08/20 1504 99 F (37.2 C)     Temp Source 05/08/20 1504 Oral     SpO2 05/08/20 1504 97 %     Weight 05/08/20 1505 189 lb (85.7 kg)     Height --      Head Circumference --      Peak Flow --      Pain Score 05/08/20 1505 5     Pain Loc --      Pain Edu? --      Excl. in GC? --    No data found.  Updated Vital Signs BP 118/75 (BP Location: Right Arm)   Pulse 82   Temp 99 F (37.2 C) (Oral)   Resp 16   Wt 189 lb (85.7 kg)   SpO2 97%   BMI 35.71 kg/m   Visual Acuity Right Eye Distance:   Left Eye Distance:   Bilateral Distance:    Right Eye Near:   Left Eye Near:    Bilateral Near:     Physical Exam Vitals and nursing note reviewed.  Constitutional:      General: She is not in acute distress.    Appearance: She is well-developed. She is obese. She is not ill-appearing.  HENT:     Head: Normocephalic and atraumatic.     Right Ear: Tympanic membrane, ear canal and external ear normal.     Left Ear: Tympanic membrane, ear canal and external ear normal.     Nose: Nose normal.     Mouth/Throat:     Mouth: Mucous membranes are moist.     Pharynx: Oropharyngeal exudate and posterior oropharyngeal erythema present.  Eyes:     Extraocular Movements: Extraocular movements intact.     Conjunctiva/sclera: Conjunctivae normal.     Pupils: Pupils are equal, round, and reactive to light.  Cardiovascular:     Rate and Rhythm: Normal rate and regular rhythm.     Heart sounds: Normal heart sounds. No murmur heard.   Pulmonary:     Effort: Pulmonary effort  is normal. No respiratory distress.     Breath sounds: Normal breath sounds. No stridor. No wheezing, rhonchi or rales.  Chest:     Chest wall:  No tenderness.  Abdominal:     General: Bowel sounds are normal. There is no distension.     Palpations: Abdomen is soft. There is no mass.     Tenderness: There is no abdominal tenderness. There is no right CVA tenderness, left CVA tenderness, guarding or rebound.     Hernia: No hernia is present.  Musculoskeletal:        General: Normal range of motion.     Cervical back: Normal range of motion and neck supple.  Lymphadenopathy:     Cervical: Cervical adenopathy present.  Skin:    General: Skin is warm and dry.     Capillary Refill: Capillary refill takes less than 2 seconds.  Neurological:     General: No focal deficit present.     Mental Status: She is alert and oriented to person, place, and time.  Psychiatric:        Mood and Affect: Mood normal.        Behavior: Behavior normal.        Thought Content: Thought content normal.      UC Treatments / Results  Labs (all labs ordered are listed, but only abnormal results are displayed) Labs Reviewed  POCT RAPID STREP A (OFFICE)    EKG   Radiology No results found.  Procedures Procedures (including critical care time)  Medications Ordered in UC Medications - No data to display  Initial Impression / Assessment and Plan / UC Course  I have reviewed the triage vital signs and the nursing notes.  Pertinent labs & imaging results that were available during my care of the patient were reviewed by me and considered in my medical decision making (see chart for details).    Acute Tonsillitis  Prescribed Augmentin 875 BID x 7 days to cover for tonsillitis Prescribed fluconazole in case of yeast May use tylenol/ibuprofen as needed for throat pain Drink plenty of fluids Follow up with this office or with primary care if symptoms are persisting.  Follow up in the ER for high  fever, trouble swallowing, trouble breathing, other concerning symptoms.    Final Clinical Impressions(s) / UC Diagnoses   Final diagnoses:  Acute tonsillitis, unspecified etiology     Discharge Instructions     I have sent in Augmentin for you to take twice a day for 7 days.  Follow up with this office or with primary care if symptoms are persisting.  Follow up in the ER for high fever, trouble swallowing, trouble breathing, other concerning symptoms.;u      ED Prescriptions    Medication Sig Dispense Auth. Provider   amoxicillin-clavulanate (AUGMENTIN) 875-125 MG tablet Take 1 tablet by mouth 2 (two) times daily for 7 days. 14 tablet Moshe Cipro, NP   fluconazole (DIFLUCAN) 150 MG tablet Take one tablet at the onset of symptoms. If symptoms are still present 3 days later, take the second tablet. 2 tablet Moshe Cipro, NP     PDMP not reviewed this encounter.   Moshe Cipro, NP 05/14/20 1058

## 2020-05-18 ENCOUNTER — Encounter: Payer: Self-pay | Admitting: Internal Medicine

## 2020-05-25 ENCOUNTER — Encounter (HOSPITAL_COMMUNITY): Payer: Self-pay | Admitting: *Deleted

## 2020-05-25 ENCOUNTER — Emergency Department (HOSPITAL_COMMUNITY)
Admission: EM | Admit: 2020-05-25 | Discharge: 2020-05-26 | Disposition: A | Discharge status: Home / Self Care | Payer: Medicaid Other | Attending: Emergency Medicine | Admitting: Emergency Medicine

## 2020-05-25 DIAGNOSIS — W268XXA Contact with other sharp object(s), not elsewhere classified, initial encounter: Secondary | ICD-10-CM | POA: Diagnosis not present

## 2020-05-25 DIAGNOSIS — F17210 Nicotine dependence, cigarettes, uncomplicated: Secondary | ICD-10-CM | POA: Diagnosis not present

## 2020-05-25 DIAGNOSIS — T1491XA Suicide attempt, initial encounter: Secondary | ICD-10-CM | POA: Insufficient documentation

## 2020-05-25 DIAGNOSIS — S60812A Abrasion of left wrist, initial encounter: Secondary | ICD-10-CM | POA: Insufficient documentation

## 2020-05-25 DIAGNOSIS — F4325 Adjustment disorder with mixed disturbance of emotions and conduct: Secondary | ICD-10-CM | POA: Insufficient documentation

## 2020-05-25 DIAGNOSIS — Z7289 Other problems related to lifestyle: Secondary | ICD-10-CM

## 2020-05-25 DIAGNOSIS — R4588 Nonsuicidal self-harm: Secondary | ICD-10-CM

## 2020-05-25 DIAGNOSIS — F4329 Adjustment disorder with other symptoms: Secondary | ICD-10-CM | POA: Insufficient documentation

## 2020-05-25 DIAGNOSIS — S6992XA Unspecified injury of left wrist, hand and finger(s), initial encounter: Secondary | ICD-10-CM | POA: Diagnosis present

## 2020-05-25 LAB — CBC WITH DIFFERENTIAL/PLATELET
Abs Immature Granulocytes: 0.05 10*3/uL (ref 0.00–0.07)
Basophils Absolute: 0.1 10*3/uL (ref 0.0–0.1)
Basophils Relative: 0 %
Eosinophils Absolute: 0.2 10*3/uL (ref 0.0–0.5)
Eosinophils Relative: 1 %
HCT: 41 % (ref 36.0–46.0)
Hemoglobin: 13.8 g/dL (ref 12.0–15.0)
Immature Granulocytes: 0 %
Lymphocytes Relative: 15 %
Lymphs Abs: 2.1 10*3/uL (ref 0.7–4.0)
MCH: 32.3 pg (ref 26.0–34.0)
MCHC: 33.7 g/dL (ref 30.0–36.0)
MCV: 96 fL (ref 80.0–100.0)
Monocytes Absolute: 0.6 10*3/uL (ref 0.1–1.0)
Monocytes Relative: 5 %
Neutro Abs: 11.3 10*3/uL — ABNORMAL HIGH (ref 1.7–7.7)
Neutrophils Relative %: 79 %
Platelets: 349 10*3/uL (ref 150–400)
RBC: 4.27 MIL/uL (ref 3.87–5.11)
RDW: 12.4 % (ref 11.5–15.5)
WBC: 14.3 10*3/uL — ABNORMAL HIGH (ref 4.0–10.5)
nRBC: 0 % (ref 0.0–0.2)

## 2020-05-25 LAB — ETHANOL: Alcohol, Ethyl (B): <10 mg/dL (ref <10)

## 2020-05-25 LAB — COMPREHENSIVE METABOLIC PANEL
ALT: 43 U/L (ref 0–44)
AST: 27 U/L (ref 15–41)
Albumin: 4.3 g/dL (ref 3.5–5.0)
Alkaline Phosphatase: 65 U/L (ref 38–126)
Anion gap: 7 (ref 5–15)
BUN: 8 mg/dL (ref 6–20)
CO2: 23 mmol/L (ref 22–32)
Calcium: 9.2 mg/dL (ref 8.9–10.3)
Chloride: 106 mmol/L (ref 98–111)
Creatinine, Ser: 0.58 mg/dL (ref 0.44–1.00)
GFR, Estimated: >60 mL/min (ref >60)
Glucose, Bld: 95 mg/dL (ref 70–99)
Potassium: 3.9 mmol/L (ref 3.5–5.1)
Sodium: 136 mmol/L (ref 135–145)
Total Bilirubin: 0.3 mg/dL (ref 0.3–1.2)
Total Protein: 7.7 g/dL (ref 6.5–8.1)

## 2020-05-25 LAB — RAPID URINE DRUG SCREEN, HOSP PERFORMED
Amphetamines: NOT DETECTED
Barbiturates: NOT DETECTED
Benzodiazepines: NOT DETECTED
Cocaine: NOT DETECTED
Opiates: NOT DETECTED
Tetrahydrocannabinol: NOT DETECTED

## 2020-05-25 LAB — POC URINE PREG, ED: Preg Test, Ur: NEGATIVE

## 2020-05-25 MED ORDER — IBUPROFEN 800 MG PO TABS
800.0000 mg | ORAL_TABLET | Freq: Once | ORAL | Status: AC
  Administered 2020-05-25: 800 mg via ORAL
  Filled 2020-05-25: qty 1

## 2020-05-25 MED ORDER — NICOTINE 21 MG/24HR TD PT24
21.0000 mg | MEDICATED_PATCH | Freq: Once | TRANSDERMAL | Status: DC
  Administered 2020-05-25: 21 mg via TRANSDERMAL
  Filled 2020-05-25: qty 1

## 2020-05-25 MED ORDER — ACETAMINOPHEN 325 MG PO TABS: 650.0000 mg | ORAL_TABLET | ORAL | Status: DC | PRN

## 2020-05-25 NOTE — ED Provider Notes (Signed)
Signed out to me from [place], [place].  Cutter, denies SI/HI.  No home supportive care.  Pending TTS evaluation.    10:13 PM Pt has been evaluated by TTS. Pt has history of self injurious behavior which is better when undergoing outpatient therapy but has no current psychiatric services at this time.  She cannot sign for safety and has no reliable family members.  TTS recommends overnight observation with reevaluation in the morning.  Patient is upset with this plan.  She wants to leave.  After discussing the options including voluntary versus involuntarily staying, she has agreed to stay voluntarily.  I have completed IVC paperwork, however it has not been submitted but is available if patient decides to change her mind about staying.  Psych holding orders have been placed.   Burgess Amor, PA-C 05/25/20 2311    Derwood Kaplan, MD 05/26/20 1525

## 2020-05-25 NOTE — BH Assessment (Signed)
Comprehensive Clinical Assessment (CCA) Note  05/25/2020 Kimberly Norris [id number]  Chief Complaint:  Chief Complaint  Patient presents with  . V70.1   Visit Diagnosis: Adjustment disorder with emotional disturbance Self injurious behaviors  Disposition:  Per Liborio Nixon, NP overnight observation with provider reassessment in AM.  Charge nurse, EDP informed via Epic secure chat.   Flowsheet Row ED from 05/25/2020 in [place] [facility]PART[facility] ED from 05/08/2020 in [facility] from 03/07/2020 in [facility] at [facility] RISK CATEGORY No Risk No Risk No Risk     The patient demonstrates the following risk factors for suicide: Chronic risk factors for suicide include: psychiatric disorder of adjustment disorder; depression/anxiety and previous self-harm history of self injurious behaviors. Acute risk factors for suicide include: family or marital conflict and social withdrawal/isolation. Protective factors for this patient include: n/a. Considering these factors, the overall suicide risk at this point appears to be moderate. Patient is not appropriate for outpatient follow up.  Kimberly Norris is a 25 yo female reporting to [facility] for evaluation of self-injurious behavior. Pt states that she did cut her left wrist recently after a verbal altercation with her sister. Pt states that the action was NOT suicidal in nature, she was just using the coping mechanism that she had at the time. Pt reports that she has a past history of cutting behaviors, which got better when she was in outpatient therapy. Pt is currently not receiving any psychiatric services at time of assessment. Pt reports that she lives alone and has a brother in law that could possibly stay with her. Pt denies any current depression or anxiety symptoms. Pt denies SI, HI, AVH, or paranoid ideation. Pt denies any daily substance use other than nicotine. Pt reports that she currently is not a  danger to herself. Collateral contacted per pt:  [place]via [place] [phone].Sharma Covert reports that he is ex husband of sister and is off the next to days and could watch/keep E[place] Noe safe. Sharma Covert was asking questions about why Shondell is in the hospital and clinican stated that no information could be disclosed to him by staff members due to HIPPA confidentiality. Sharma Covert understood.    Weyman Pedro, MSW, LCSW Outpatient Therapist/Triage Specialist    CCA Screening, Triage and Referral (STR)  Patient Reported Information How did you hear about [place]? Self  Referral name: self referral  Referral phone number: No data recorded  Whom do you see for routine medical problems? Primary Care  Practice/Facility Name: Dr. Coral Ceo  Practice/Facility Phone Number: No data recorded Name of Contact: No data recorded Contact Number: No data recorded Contact Fax Number: No data recorded Prescriber Name: No data recorded Prescriber Address (if known): No data recorded  What Is the Reason for Your Visit/Call Today? self injurious behavior  How Long Has This Been Causing You Problems? <Week  What Do You Feel Would Help You the Most Today? Treatment for Depression or other mood problem   Have You Recently Been in Any Inpatient Treatment (Hospital/Detox/Crisis Center/28-Day Program)? No  Name/Location of Program/Hospital:No data recorded How Long Were You There? No data recorded When Were You Discharged? No data recorded  Have You Ever Received Services From [facility] Before? Yes  Who Do You See at [facility]? No data recorded  Have You Recently Had Any Thoughts About Hurting Yourself? Yes (pt states cutting behaviors, not suicidal thoughts/actions)  Are You Planning to Commit Suicide/Harm Yourself At This time? No  Have you Recently Had Thoughts About Hurting Someone Karolee Ohs? No  Explanation: No data recorded  Have You Used Any Alcohol or Drugs in the Past 24 Hours? No  How Long  Ago Did You Use Drugs or Alcohol? No data recorded What Did You Use and How Much? No data recorded  Do You Currently Have a Therapist/Psychiatrist? No  Name of Therapist/Psychiatrist: No data recorded  Have You Been Recently Discharged From Any Office Practice or Programs? No  Explanation of Discharge From Practice/Program: No data recorded    CCA Screening Triage Referral Assessment Type of Contact: Tele-Assessment  Is this Initial or Reassessment? Initial Assessment  Date Telepsych consult ordered in [facility]:  05/25/2020  Time Telepsych consult ordered in [facility]:  1852   Patient Reported Information Reviewed? Yes  Patient Left Without Being Seen? No data recorded Reason for Not Completing Assessment: No data recorded  Collateral Involvement: No data recorded  Does Patient Have a Court Appointed Legal Guardian? No data recorded Name and Contact of Legal Guardian: No data recorded If Minor and Not Living with Parent(s), Who has Custody? No data recorded Is CPS involved or ever been involved? Never  Is APS involved or ever been involved? Never   Patient Determined To Be At Risk for Harm To Self or Others Based on Review of Patient Reported Information or Presenting Complaint? No  Method: No data recorded Availability of Means: No data recorded Intent: No data recorded Notification Required: No data recorded Additional Information for Danger to Others Potential: No data recorded Additional Comments for Danger to Others Potential: No data recorded Are There Guns or Other Weapons in Your Home? No data recorded Types of Guns/Weapons: No data recorded Are These Weapons Safely Secured?                            No data recorded Who Could Verify You Are Able To Have These Secured: No data recorded Do You Have any Outstanding Charges, Pending Court Dates, Parole/Probation? No data recorded Contacted To Inform of Risk of Harm To Self or Others: No data recorded  Location of  Assessment: [facility]   Does Patient Present under Involuntary Commitment? No  IVC Papers Initial File Date: No data recorded  [place] of Residence: [place]   Patient Currently Receiving the Following Services: Not Receiving Services   Determination of Need: Emergent (2 hours)   Options For Referral: Medication Management; Outpatient Therapy; Intensive Outpatient Therapy     CCA Biopsychosocial Intake/Chief Complaint:  Kimberly Norris is a 25 yo female reporting to [facility] for evaluation of self-injurious behavior. Pt states that she did cut her left wrist recently after a verbal altercation with her sister. Pt states that the action was NOT suicidal in nature, she was just using the coping mechanism that she had at the time. Pt reports that she has a past history of cutting behaviors, which got better when she was in outpatient therapy. Pt is currently not receiving any psychiatric services at time of assessment. Pt reports that she lives alone and has a brother that could possibly stay with her.  Current Symptoms/Problems: family conflict; self injurious behavior   Patient Reported Schizophrenia/Schizoaffective Diagnosis in Past: No   Strengths: pt wants to get better; wants psychiatric stabilization  Preferences: outpatient psychiatric stabilization  Abilities: No data recorded  Type of Services Patient Feels are Needed: No data recorded  Initial Clinical Notes/Concerns: No data recorded  Mental Health Symptoms Depression:  None   Duration of Depressive symptoms: No data recorded  Mania:  No data recorded  Anxiety:   None   Psychosis:  None   Duration of Psychotic symptoms: No data recorded  Trauma:  None   Obsessions:  None   Compulsions:  None   Inattention:  None   Hyperactivity/Impulsivity:  N/A   Oppositional/Defiant Behaviors:  Angry   Emotional Irregularity:  Mood lability   Other Mood/Personality Symptoms:  No data recorded   Mental Status  Exam Appearance and self-care  Stature:  Average   Weight:  Average weight   Clothing:  No data recorded  Grooming:  Normal   Cosmetic use:  None   Posture/gait:  Normal   Motor activity:  Not Remarkable   Sensorium  Attention:  Normal   Concentration:  Normal   Orientation:  X5   Recall/memory:  Normal   Affect and Mood  Affect:  Appropriate   Mood:  Depressed   Relating  Eye contact:  Normal   Facial expression:  Depressed   Attitude toward examiner:  Cooperative   Thought and Language  Speech flow: Clear and Coherent   Thought content:  Appropriate to Mood and Circumstances   Preoccupation:  None   Hallucinations:  None   Organization:  No data recorded  Affiliated Computer ServicesExecutive Functions  Fund of Knowledge:  Fair   Intelligence:  Average   Abstraction:  Normal   Judgement:  Fair   Dance movement psychotherapisteality Testing:  Variable   Insight:  Gaps   Decision Making:  Impulsive   Social Functioning  Social Maturity:  Impulsive   Social Judgement:  Normal   Stress  Stressors:  Family conflict   Coping Ability:  Human resources officerverwhelmed   Skill Deficits:  Self-control   Supports:  Family     Religion: Religion/Spirituality Are You A Religious Person?: No  Leisure/Recreation: Leisure / Recreation Do You Have Hobbies?: Yes Leisure and Hobbies: going with friends/ fishing  Exercise/Diet: Exercise/Diet Do You Exercise?: Yes Have You Gained or Lost A Significant Amount of Weight in the Past Six Months?: No Do You Follow a Special Diet?: No Do You Have Any Trouble Sleeping?: No   CCA Employment/Education Employment/Work Situation: Employment / Work Situation Employment situation: Unemployed Has patient ever been in the Eli Lilly and Companymilitary?: No  Education: Education Is Patient Currently Attending School?: No Did Garment/textile technologistYou Graduate From [employer] School?: Yes Did Theme park managerYou Attend College?: No   CCA Family/Childhood History Family and Relationship History:    Childhood History:  Childhood  History Additional childhood history information: stable childhood Description of patient's relationship with caregiver when they were a child: stable Patient's description of current relationship with people who raised him/her: unstable with family members currently after recent conflict How were you disciplined when you got in trouble as a child/adolescent?: fair discipline Does patient have siblings?: Yes Description of patient's current relationship with siblings: sister--conflict currently    brother--stable relationship Did patient suffer any verbal/emotional/physical/sexual abuse as a child?: No Did patient suffer from severe childhood neglect?: No Has patient ever been sexually abused/assaulted/raped as an adolescent or adult?: No Was the patient ever a victim of a crime or a disaster?: No Witnessed domestic violence?: No Has patient been affected by domestic violence as an adult?: No  Child/Adolescent Assessment:     CCA Substance Use Alcohol/Drug Use: Alcohol / Drug Use Pain Medications: see MAR Prescriptions: see MAR Over the Counter: see MAR History of alcohol / drug use?: Yes Substance #1 Name of Substance 1: etoh  1 - Age of First Use: 23 1 - Amount (size/oz): variable 1 - Frequency: rarely/socially 1 - Duration: 2 yrs 1 - Method of Aquiring: legal 1- Route of Use: oral/drink Substance #2 Name of Substance 2: nicotine 2 - Age of First Use: 20 2 - Amount (size/oz): variable 2 - Frequency: daily 2 - Duration: 5 years 2 - Last Use / Amount: today 2 - Method of Aquiring: legal 2 - Route of Substance Use: oral/smoke  ASAM's:  Six Dimensions of Multidimensional Assessment  Dimension 1:  Acute Intoxication and/or Withdrawal Potential:   Dimension 1:  Description of individual's past and current experiences of substance use and withdrawal: social etoh and daily nicotine smoker  Dimension 2:  Biomedical Conditions and Complications:      Dimension 3:  Emotional,  Behavioral, or Cognitive Conditions and Complications:     Dimension 4:  Readiness to Change:     Dimension 5:  Relapse, Continued use, or Continued Problem Potential:     Dimension 6:  Recovery/Living Environment:     ASAM Severity Score: ASAM's Severity Rating Score: 0  ASAM Recommended Level of Treatment:     Substance use Disorder (SUD)  none  Recommendations for Services/Supports/Treatments: Recommendations for Services/Supports/Treatments Recommendations For Services/Supports/Treatments: Medication Management,Inpatient Hospitalization,IOP (Intensive Outpatient Program)  DSM5 Diagnoses: Patient Active Problem List   Diagnosis Date Noted  . Adjustment disorder with emotional disturbance   . Self-injurious behavior   . Dysphagia 04/29/2020  . Abdominal wall bulge 04/29/2020  . Alternating constipation and diarrhea 03/17/2020  . RUQ abdominal pain 03/17/2020  . Anal fissure 02/17/2020  . Skin tag of perianal region 02/17/2020  . Cold sore 12/18/2019  . Screen for STD (sexually transmitted disease) 12/18/2019  . Urinary frequency 09/16/2019  . Vaginal itching 09/16/2019  . Nausea without vomiting 08/18/2019  . Gastroesophageal reflux disease 08/18/2019  . Loss of weight 08/18/2019  . Fatty liver 08/18/2019  . Early satiety 08/18/2019  . Encounter for gynecological examination with Papanicolaou smear of cervix 07/25/2018  . Traumatic rhabdomyolysis (HCC) 07/12/2015  . Rhabdomyolysis 07/12/2015  . BMI (body mass index), pediatric, 95-99% for age 70/16/2014  . 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 Bilateral chronic knee pain 04/16/2012    Referrals to Alternative Service(s): Referred to Alternative Service(s):   Place:   Date:   Time:    Referred to Alternative Service(s):   Place:   Date:   Time:    Referred to Alternative Service(s):   Place:   Date:   Time:    Referred to Alternative Service(s):   Place:   Date:   Time:     Ernest Haber Artemisia Auvil, LCSW

## 2020-05-25 NOTE — ED Triage Notes (Signed)
DENIES suicidal ideation, states she has been cutting herself to relieve stress

## 2020-05-25 NOTE — ED Provider Notes (Signed)
[facility] EMERGENCY DEPARTMENT Provider Note   CSN: [id number] Arrival date & time: 05/25/20  1630     History Chief Complaint  Patient presents with  . V70.1    Kimberly Norris is a 25 y.o. female.  HPI      Kimberly Norris is a 25 y.o. female who presents to the Emergency Department who was brought to the emergency department by the police.  Patient states that police were contacted by a family member for possible suicide attempt.  Patient states that she got into an argument with her sister and she cut her wrist with a razor in an attempt to relieve stress.  She states that she was not trying to harm herself and denies suicidal or homicidal thoughts or plans.  She states that she has a history of cutting herself when she was 25 years old.  She was seeing counselor at that time. Not seeing a counselor currently.  States that she has been doing well since then and has not had any recent thoughts of harming herself.  Denies homicidal thoughts or plan, auditory or visual hallucinations or recent illicit drug use.     Past Medical History:  Diagnosis Date  . Bilateral chronic knee pain 04/16/2012  . Heartburn   . Liver disease    fatty liver  . Mental disorder    anxiety, depression  . Rhabdomyolysis     Patient Active Problem List   Diagnosis Date Noted  . Dysphagia 04/29/2020  . Abdominal wall bulge 04/29/2020  . Alternating constipation and diarrhea 03/17/2020  . RUQ abdominal pain 03/17/2020  . Anal fissure 02/17/2020  . Skin tag of perianal region 02/17/2020  . Cold sore 12/18/2019  . Screen for STD (sexually transmitted disease) 12/18/2019  . Urinary frequency 09/16/2019  . Vaginal itching 09/16/2019  . Nausea without vomiting 08/18/2019  . Gastroesophageal reflux disease 08/18/2019  . Loss of weight 08/18/2019  . Fatty liver 08/18/2019  . Early satiety 08/18/2019  . Encounter for gynecological examination with Papanicolaou smear of cervix 07/25/2018  .  Traumatic rhabdomyolysis (HCC) 07/12/2015  . Rhabdomyolysis 07/12/2015  . BMI (body mass index), pediatric, 95-99% for age 46/16/2014  . 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 Bilateral chronic knee pain 04/16/2012    Past Surgical History:  Procedure Laterality Date  . BIOPSY  09/11/2019   Procedure: BIOPSY;  Surgeon: Lanelle Bal, DO;  Location: [facility];  Service: Endoscopy;;  . ESOPHAGOGASTRODUODENOSCOPY (EGD) WITH PROPOFOL N/A 09/11/2019   Procedure: ESOPHAGOGASTRODUODENOSCOPY (EGD) WITH PROPOFOL;  Surgeon: Lanelle Bal, DO;  Esophageal mucosal changes suspicious for eosinophilic esophagitis s/p biopsy, gastritis s/p biopsy, normal examined duodenum biopsy.  All pathology was benign.      OB History    Gravida  0   Para  0   Term  0   Preterm  0   AB  0   Living  0     SAB  0   IAB  0   Ectopic  0   Multiple  0   Live Births  0           Family History  Problem Relation Age of Onset  . Autism Sister        1 sister    Social History   Tobacco Use  . Smoking status: Current Every Day Smoker    Packs/day: 0.50    Types: Cigarettes  . Smokeless tobacco: Never Used  . Tobacco comment: smokes  1-2 cig daily  Vaping Use  . Vaping Use: Former  Substance Use Topics  . Alcohol use: No  . Drug use: No    Home Medications Prior to Admission medications   Medication Sig Start Date End Date Taking? Authorizing Provider  calcium carbonate (TUMS - DOSED IN MG ELEMENTAL CALCIUM) 500 MG chewable tablet Chew 1 tablet by mouth daily. As needed    [provider]  etonogestrel (NEXPLANON) 68 MG IMPL implant Inject 68 mg into the skin once.     [provider]  fluconazole (DIFLUCAN) 150 MG tablet Take one tablet at the onset of symptoms. If symptoms are still present 3 days later, take the second tablet. 05/08/20   Moshe Cipro, NP  ibuprofen (ADVIL) 200 MG tablet Take 200 mg by mouth every 6 (six) hours as needed.     [provider]  Nitroglycerin (RECTIV) 0.4 % OINT Place 1 inch rectally in the morning and at bedtime. Patient taking differently: Place 1 inch rectally in the morning and at bedtime. As needed 03/17/20   Letta Median, PA-C  omeprazole (PRILOSEC) 40 MG capsule Take 1 capsule (40 mg total) by mouth 2 (two) times daily before a meal. 04/29/20   Letta Median, PA-C    Allergies    Flagyl [metronidazole]  Review of Systems   Review of Systems  Constitutional: Negative for chills, fatigue and fever.  HENT: Negative for trouble swallowing.   Respiratory: Negative for shortness of breath.   Cardiovascular: Negative for chest pain and palpitations.  Gastrointestinal: Negative for abdominal pain, nausea and vomiting.  Genitourinary: Negative for dysuria and flank pain.  Musculoskeletal: Negative for arthralgias and myalgias.  Skin: Positive for wound. Negative for rash.  Neurological: Negative for dizziness, syncope, weakness and numbness.  Hematological: Does not bruise/bleed easily.  Psychiatric/Behavioral: Positive for self-injury. Negative for suicidal ideas.    Physical Exam Updated Vital Signs BP (!) 160/77   Pulse (!) 112   Temp 98.9 F (37.2 C) (Oral)   Resp 18   Ht 5\' 1"  (1.549 m)   Wt 83.9 kg   SpO2 100%   BMI 34.96 kg/m   Physical Exam Vitals and nursing note reviewed.  Constitutional:      General: She is not in acute distress.    Appearance: Normal appearance. She is not ill-appearing.  HENT:     Head: Atraumatic.  Eyes:     Conjunctiva/sclera: Conjunctivae normal.  Cardiovascular:     Rate and Rhythm: Normal rate and regular rhythm.     Pulses: Normal pulses.  Pulmonary:     Effort: Pulmonary effort is normal.     Breath sounds: Normal breath sounds.  Chest:     Chest wall: No tenderness.  Abdominal:     Palpations: Abdomen is soft.     Tenderness: There is no abdominal tenderness.  Musculoskeletal:        General: Normal range of motion.      Cervical back: Normal range of motion.  Skin:    General: Skin is warm.     Capillary Refill: Capillary refill takes less than 2 seconds.     Comments: Several small superficial abrasions to distal left wrist.  No edema or active bleeding.  Patient has full range of motion of the wrist.  Neurological:     General: No focal deficit present.     Mental Status: She is alert.     Sensory: No sensory deficit.     Motor:  No weakness.  Psychiatric:        Mood and Affect: Mood normal.        Speech: Speech normal.        Behavior: Behavior normal. Behavior is cooperative.        Thought Content: Thought content is not paranoid. Thought content does not include homicidal or suicidal ideation.     ED Results / Procedures / Treatments   Labs (all labs ordered are listed, but only abnormal results are displayed) Labs Reviewed  CBC WITH DIFFERENTIAL/PLATELET - Abnormal; Notable for the following components:      Result Value   WBC 14.3 (*)    Neutro Abs 11.3 (*)    All other components within normal limits  RESP PANEL BY RT-PCR (FLU A&B, COVID) ARPGX2  COMPREHENSIVE METABOLIC PANEL  ETHANOL  RAPID URINE DRUG SCREEN, HOSP PERFORMED  POC URINE PREG, ED    EKG None  Radiology No results found.  Procedures Procedures   Medications Ordered in ED Medications - No data to display  ED Course  I have reviewed the triage vital signs and the nursing notes.  Pertinent labs & imaging results that were available during my care of the patient were reviewed by me and considered in my medical decision making (see chart for details).    MDM Rules/Calculators/A&P                          Patient here voluntarily after being brought in by the police.  States that police were contacted by family member after she had an argument with her sister.  Patient unsure but believes that sister called the police.  Admits to cutting her wrist with a razor.  Denies suicidal or homicidal ideations or  plan on my evaluation.  States that she has a history of cutting herself and that this was a way for her to relieve stress.  Denies any recent self-harm.  Patient medically clear.  Awaiting TTS consult.  Discussed findings with Burgess Amor, PA-C who agrees to arrange dispo once TTS consult has been completed with their recommendations   Final Clinical Impression(s) / ED Diagnoses Final diagnoses:  None    Rx / DC Orders ED Discharge Orders    None       Pauline Aus, PA-C 05/25/20 1952    Derwood Kaplan, MD 05/26/20 1525

## 2020-05-25 NOTE — ED Notes (Signed)
Pt on TTS video assessment at this time.

## 2020-05-26 MED ORDER — CALCIUM CARBONATE ANTACID 500 MG PO CHEW
1.0000 | CHEWABLE_TABLET | Freq: Two times a day (BID) | ORAL | Status: DC | PRN
  Administered 2020-05-26 (×2): 200 mg via ORAL
  Filled 2020-05-26 (×2): qty 1

## 2020-05-26 NOTE — ED Notes (Signed)
Pt's cell phone placed with other belongings in belonging bag, labeled, and secured in a locker. Pt made aware that belongings will be secured in ED until discharge

## 2020-05-26 NOTE — Discharge Instructions (Signed)
Follow-up as instructed by behavioral health 

## 2020-05-26 NOTE — Consult Note (Addendum)
Telepsych Consultation   Reason for Consult:  Psychiatric Evaluation for self injurious behavior  Referring Physician:  Dr. Estell Harpin Location of Patient: [facility] Location of Provider: [facility]  Patient Identification: Kimberly Norris MRN:  [id number] Principal Diagnosis: Self-injurious behavior Diagnosis:  Principal Problem:   Self-injurious behavior   Total Time spent with patient: 20 minutes  Subjective:   Kimberly Norris is a 25 y.o. female patient whom presented to the [facility] Voluntarily for self-injurious behavior.   Kimberly Norris, 25 y.o., female patient seen via tele health by this provider, consulted with Dr. Lucianne Muss; and chart reviewed on 05/26/20.    HPI:    During evaluation Kimberly Norris is in sitting position in no acute distress. She is smiling, good eye contact and pleasant.  She is alert, oriented x 4, calm and cooperative. Reports her mood as "happy"  with congruent affect.  She does not appear to be responding to internal/external stimuli or delusional thoughts.  Patient denies suicidalhomicidal ideation, psychosis, and paranoia.  Patient states that yesterday she got into an argument with her sister.  States they were arguing about financial stuff and everyday life things.  States out of anger "I cut my wrist, not hard but just a little cut".  Reports she did not have any intention on ending her life. States, "I want to live".  Patient denies any past suicide attempts or psychiatric hospitalizations.  States she should have walk away from the argument.  Reports she has done outpatient therapy when she was a teenager due to a history of cutting/selfharm. Reports she has not cut for years.  Patient reports her coping mechanisms are walking and listening to music. Patient answered all questions appropriately.  Patient reports that she spoke to her sister this morning and they are getting along. States her sister and mother are her support system.  Patient is  contracting for safety and states her ex brother-in-law Alvia Grove has agreed to stay with her for the next few days to ensure safety. States that her mother and sister are also available. Reports her protective factors are her family.  Reports she does not take any psychotropic medications and has no outpatient services in place. Offered resources and patient declined.   Per Manley Mason Social Worker note on 05/25/2020- collateral was obtained and Alvia Grove agreed to stay with patient when she is discharged.   Past Psychiatric History: pt reports depression  Risk to Self:  pt denies Risk to Others:  pt denies Prior Inpatient Therapy:  no Prior Outpatient Therapy:  yes  Past Medical History:  Past Medical History:  Diagnosis Date  . Bilateral chronic knee pain 04/16/2012  . Heartburn   . Liver disease    fatty liver  . Mental disorder    anxiety, depression  . Rhabdomyolysis     Past Surgical History:  Procedure Laterality Date  . BIOPSY  09/11/2019   Procedure: BIOPSY;  Surgeon: Lanelle Bal, DO;  [facility];  Service: Endoscopy;;  . ESOPHAGOGASTRODUODENOSCOPY (EGD) WITH PROPOFOL N/A 09/11/2019   Procedure: ESOPHAGOGASTRODUODENOSCOPY (EGD) WITH PROPOFOL;  Surgeon: Lanelle Bal, DO;  Esophageal mucosal changes suspicious for eosinophilic esophagitis s/p biopsy, gastritis s/p biopsy, normal examined duodenum biopsy.  All pathology was benign.    Family History:  Family History  Problem Relation Age of Onset  . Autism Sister        1 sister   Family Psychiatric  History: unknown  Social History:  Social History   Substance and Sexual Activity  Alcohol Use No     Social History   Substance and Sexual Activity  Drug Use No    Social History   Socioeconomic History  . Marital status: Single    Spouse name: Not on file  . Number of children: Not on file  . Years of education: Not on file  . Highest education level: Not on file   Occupational History  . Not on file  Tobacco Use  . Smoking status: Current Every Day Smoker    Packs/day: 0.50    Types: Cigarettes  . Smokeless tobacco: Never Used  . Tobacco comment: smokes 1-2 cig daily  Vaping Use  . Vaping Use: Former  Substance and Sexual Activity  . Alcohol use: No  . Drug use: No  . Sexual activity: Yes    Birth control/protection: Implant  Other Topics Concern  . Not on file  Social History Narrative  . Not on file   Social Determinants of Health   Financial Resource Strain: Not on file  Food Insecurity: Not on file  Transportation Needs: Not on file  Physical Activity: Not on file  Stress: Not on file  Social Connections: Not on file   Additional Social History:    Allergies:   Allergies  Allergen Reactions  . Flagyl [Metronidazole] Rash    Labs:  Results for orders placed or performed during the hospital encounter of 05/25/20 (from the past 48 hour(s))  POC urine preg, ED     Status: None   Collection Time: 05/25/20  4:48 PM  Result Value Ref Range   Preg Test, Ur NEGATIVE NEGATIVE    Comment:        THE SENSITIVITY OF THIS METHODOLOGY IS >24 mIU/mL   Comprehensive metabolic panel     Status: None   Collection Time: 05/25/20  5:57 PM  Result Value Ref Range   Sodium 136 135 - 145 mmol/L   Potassium 3.9 3.5 - 5.1 mmol/L   Chloride 106 98 - 111 mmol/L   CO2 23 22 - 32 mmol/L   Glucose, Bld 95 70 - 99 mg/dL    Comment: Glucose reference range applies only to samples taken after fasting for at least 8 hours.   BUN 8 6 - 20 mg/dL   Creatinine, Ser 1.610.58 0.44 - 1.00 mg/dL   Calcium 9.2 8.9 - 09.610.3 mg/dL   Total Protein 7.7 6.5 - 8.1 g/dL   Albumin 4.3 3.5 - 5.0 g/dL   AST 27 15 - 41 U/L   ALT 43 0 - 44 U/L   Alkaline Phosphatase 65 38 - 126 U/L   Total Bilirubin 0.3 0.3 - 1.2 mg/dL   GFR, Estimated >04>60 >54>60 mL/min    Comment: (NOTE) Calculated using the CKD-EPI Creatinine Equation (2021)    Anion gap 7 5 - 15    Comment:  Performed at [facility], [address] [place], [place] [address]  CBC with Diff     Status: Abnormal   Collection Time: 05/25/20  5:57 PM  Result Value Ref Range   WBC 14.3 (H) 4.0 - 10.5 K/uL   RBC 4.27 3.87 - 5.11 MIL/uL   Hemoglobin 13.8 12.0 - 15.0 g/dL   HCT 91.441.0 78.236.0 - 95.646.0 %   MCV 96.0 80.0 - 100.0 fL   MCH 32.3 26.0 - 34.0 pg   MCHC 33.7 30.0 - 36.0 g/dL   RDW 21.312.4 08.611.5 - 57.815.5 %   Platelets  349 150 - 400 K/uL   nRBC 0.0 0.0 - 0.2 %   Neutrophils Relative % 79 %   Neutro Abs 11.3 (H) 1.7 - 7.7 K/uL   Lymphocytes Relative 15 %   Lymphs Abs 2.1 0.7 - 4.0 K/uL   Monocytes Relative 5 %   Monocytes Absolute 0.6 0.1 - 1.0 K/uL   Eosinophils Relative 1 %   Eosinophils Absolute 0.2 0.0 - 0.5 K/uL   Basophils Relative 0 %   Basophils Absolute 0.1 0.0 - 0.1 K/uL   Immature Granulocytes 0 %   Abs Immature Granulocytes 0.05 0.00 - 0.07 K/uL    Comment: Performed at [facility], [address], [place], [place] [address]  Ethanol     Status: None   Collection Time: 05/25/20  5:58 PM  Result Value Ref Range   Alcohol, Ethyl (B) <10 <10 mg/dL    Comment: (NOTE) Lowest detectable limit for serum alcohol is 10 mg/dL.  For medical purposes only. Performed at [facility], [address] [place], [place] [address]   Urine rapid drug screen (hosp performed)     Status: None   Collection Time: 05/25/20 11:21 PM  Result Value Ref Range   Opiates NONE DETECTED NONE DETECTED   Cocaine NONE DETECTED NONE DETECTED   Benzodiazepines NONE DETECTED NONE DETECTED   Amphetamines NONE DETECTED NONE DETECTED   Tetrahydrocannabinol NONE DETECTED NONE DETECTED   Barbiturates NONE DETECTED NONE DETECTED    Comment: (NOTE) DRUG SCREEN FOR MEDICAL PURPOSES ONLY.  IF CONFIRMATION IS NEEDED FOR ANY PURPOSE, NOTIFY LAB WITHIN 5 DAYS.  LOWEST DETECTABLE LIMITS FOR URINE DRUG SCREEN Drug Class                     Cutoff (ng/mL) Amphetamine and metabolites    1000 Barbiturate and metabolites     200 Benzodiazepine                 200 Tricyclics and metabolites     300 Opiates and metabolites        300 Cocaine and metabolites        300 THC                            50 Performed at [facility], [address] [place], [place] [address]     Medications:  Current Facility-Administered Medications  Medication Dose Route Frequency Provider Last Rate Last Admin  . acetaminophen (TYLENOL) tablet 650 mg  650 mg Oral Q4H PRN Burgess Amor, PA-C      . calcium carbonate (TUMS - dosed in mg elemental calcium) chewable tablet 200 mg of elemental calcium  1 tablet Oral BID PRN Sabas Sous, MD   200 mg of elemental calcium at 05/26/20 0319  . nicotine (NICODERM CQ - dosed in mg/24 hours) patch 21 mg  21 mg Transdermal Once Burgess Amor, PA-C   21 mg at 05/25/20 2323   Current Outpatient Medications  Medication Sig Dispense Refill  . calcium carbonate (TUMS - DOSED IN MG ELEMENTAL CALCIUM) 500 MG chewable tablet Chew 1 tablet by mouth daily. As needed    . ibuprofen (ADVIL) 200 MG tablet Take 200 mg by mouth every 6 (six) hours as needed.    . Nitroglycerin (RECTIV) 0.4 % OINT Place 1 inch rectally in the morning and at bedtime. (Patient taking differently: Place 1 inch rectally in the morning and at bedtime. As needed) 30  g 1  . omeprazole (PRILOSEC) 40 MG capsule Take 1 capsule (40 mg total) by mouth 2 (two) times daily before a meal. 60 capsule 3  . etonogestrel (NEXPLANON) 68 MG IMPL implant Inject 68 mg into the skin once.     . fluconazole (DIFLUCAN) 150 MG tablet Take one tablet at the onset of symptoms. If symptoms are still present 3 days later, take the second tablet. (Patient not taking: No sig reported) 2 tablet 0    Musculoskeletal: Strength & Muscle Tone: within normal limits Gait & Station: normal Patient leans: Right and N/A  Psychiatric Specialty Exam: Physical Exam Vitals reviewed.  HENT:     Head: Normocephalic.     Right Ear: Tympanic membrane normal.     Left  Ear: Tympanic membrane normal.     Nose: Nose normal.     Mouth/Throat:     Pharynx: Oropharynx is clear.  Eyes:     Conjunctiva/sclera: Conjunctivae normal.  Cardiovascular:     Rate and Rhythm: Normal rate.  Pulmonary:     Effort: Pulmonary effort is normal.  Abdominal:     Tenderness: There is no guarding.  Musculoskeletal:        General: Normal range of motion.     Cervical back: Normal range of motion.  Skin:    General: Skin is dry.     Capillary Refill: Capillary refill takes less than 2 seconds.  Neurological:     Mental Status: She is alert and oriented to person, place, and time.  Psychiatric:        Attention and Perception: Attention normal.        Mood and Affect: Mood normal.        Speech: Speech normal.        Behavior: Behavior normal. Behavior is cooperative.        Thought Content: Thought content normal.        Cognition and Memory: Cognition normal.        Judgment: Judgment is impulsive.     Review of Systems  Constitutional: Negative.   HENT: Negative.   Eyes: Negative.   Respiratory: Negative.   Cardiovascular: Negative.   Gastrointestinal: Negative.   Endocrine: Negative.   Genitourinary: Negative.   Musculoskeletal: Negative.   Skin: Positive for wound (superficial cut on r wrist ).  Allergic/Immunologic: Negative.   Neurological: Negative.   Hematological: Negative.   Psychiatric/Behavioral: Negative.     Blood pressure 122/73, pulse 62, temperature 98.6 F (37 C), temperature source Oral, resp. rate 16, height 5\' 1"  (1.549 m), weight 83.9 kg, SpO2 100 %.Body mass index is 34.96 kg/m.  General Appearance: Fairly Groomed  Eye Contact:  Good  Speech:  Clear and Coherent and Normal Rate  Volume:  Normal  Mood:  Negative and Euthymic  Affect:  Congruent  Thought Process:  Coherent  Orientation:  Full (Time, Place, and Person)  Thought Content:  Logical  Suicidal Thoughts:  No  Homicidal Thoughts:  No  Memory:  Immediate;    Good Recent;   Good Remote;   Good  Judgement:  Good  Insight:  Good  Psychomotor Activity:  Normal  Concentration:  Concentration: Good and Attention Span: Good  Recall:  Good  Fund of Knowledge:  Good  Language:  Good  Akathisia:  No  Handed:  Right  AIMS (if indicated):     Assets:  Communication Skills Desire for Improvement Financial Resources/Insurance Housing Leisure Time Physical Health Resilience Social Support Transportation  ADL's:  Intact  Cognition:  WNL  Sleep:   good     Treatment Plan Summary: Follow up with out patient services. Patient declined resources. States she wants to find her own providers.   No medication recommendations at this time.   The suicide prevention education provided includes the following:  Suicide risk factors  Suicide prevention and interventions  National Suicide Hotline telephone number  [facility] assessment telephone number  [facility] Emergency Assistance [address] and/or Residential Mobile Crisis Unit telephone number  Remove weapons (e.g., guns, rifles, knives), all items previously/currently identified as safety concern.   Remove drugs/medications (over the counter, prescriptions, illicit drugs), all items previously/currently identified as a safety concern.  Disposition:  Patient is psychiatrically cleared   No evidence of imminent risk to self or others at present.   Patient does not meet criteria for psychiatric inpatient admission. Supportive therapy provided about ongoing stressors. Discussed crisis plan, support from social network, calling 911, coming to the Emergency Department, and calling Suicide Hotline.  This service was provided via telemedicine using a 2-way, interactive audio and video technology.  Names of all persons participating in this telemedicine service and their role in this encounter. Name: Kimberly Norris  Role: patient  Name: Vernard Gambles  Role: NP   Name:  Role:   Name: Role:   EDP Dr. Estell Harpin notified of patient disposition.   Ardis Hughs, NP 05/26/2020 10:28 AM

## 2020-05-26 NOTE — ED Notes (Signed)
Pt medicated per MAR, provided water, denies further request at this time. Returned to bed. Sitter remains with patient. Will continue to monitor.

## 2020-05-26 NOTE — ED Notes (Signed)
Pt updated on TTS recommendations of observation overnight and re-evaluation in the morning. EDPs made aware of recommendations via this RN and secure messaging from C. Hussami, LCSW. Pt reports she wants to leave, Dr. Rhunette Croft and J. Idol,PA-C made aware. Pt made aware that she is under voluntary commitment and recommended for re-evaluation in the morning, however if she attempts to leave the ED, voluntary status may become involuntary via EDP. Pt's questions answered, pt verbalizes understanding.

## 2020-05-26 NOTE — ED Notes (Signed)
Pt c/o heart burn/acid reflux, EDP made aware. Verbal order for TUMS 200mg  twice daily PRN from Dr. 

## 2020-06-01 ENCOUNTER — Ambulatory Visit: Payer: Medicaid Other | Admitting: General Surgery

## 2020-06-08 ENCOUNTER — Encounter: Payer: Self-pay | Admitting: Emergency Medicine

## 2020-06-08 ENCOUNTER — Ambulatory Visit
Admission: EM | Admit: 2020-06-08 | Discharge: 2020-06-08 | Disposition: A | Discharge status: Home / Self Care | Payer: Medicaid Other | Attending: Family Medicine | Admitting: Family Medicine

## 2020-06-08 DIAGNOSIS — H02846 Edema of left eye, unspecified eyelid: Secondary | ICD-10-CM | POA: Diagnosis not present

## 2020-06-08 DIAGNOSIS — L245 Irritant contact dermatitis due to other chemical products: Secondary | ICD-10-CM

## 2020-06-08 MED ORDER — DEXAMETHASONE SODIUM PHOSPHATE 10 MG/ML IJ SOLN
10.0000 mg | Freq: Once | INTRAMUSCULAR | Status: AC
  Administered 2020-06-08: 10 mg via INTRAMUSCULAR

## 2020-06-08 NOTE — Discharge Instructions (Signed)
You have received a steroid shot in the office for the swelling  May take benadryl as needed  May use ice to the area as needed  Follow up with this office or with primary care if symptoms are persisting.  Follow up in the ER for high fever, trouble swallowing, trouble breathing, other concerning symptoms.

## 2020-06-08 NOTE — ED Triage Notes (Signed)
Left eye lid swollen x 2 days.

## 2020-06-14 NOTE — ED Provider Notes (Signed)
[facility]    CSN: [id number] Arrival date & time: 06/08/20  1302      History   Chief Complaint No chief complaint on file.   HPI Kimberly Norris is a 25 y.o. female.   Reports that she has been having left eyelid swelling for the last 2 days. Has not attempted OTC treatment. Denies previous symptoms. There are not aggravating or alleviating factors. States that she was out working in the yard earlier this week. Denies changes in vision, blurry vision, drainage, bleeding from the area, other symptoms.  ROS per HPI  The history is provided by the patient.    Past Medical History:  Diagnosis Date  . Bilateral chronic knee pain 04/16/2012  . Heartburn   . Liver disease    fatty liver  . Mental disorder    anxiety, depression  . Rhabdomyolysis     Patient Active Problem List   Diagnosis Date Noted  . Adjustment disorder with emotional disturbance   . Self-injurious behavior   . Dysphagia 04/29/2020  . Abdominal wall bulge 04/29/2020  . Alternating constipation and diarrhea 03/17/2020  . RUQ abdominal pain 03/17/2020  . Anal fissure 02/17/2020  . Skin tag of perianal region 02/17/2020  . Cold sore 12/18/2019  . Screen for STD (sexually transmitted disease) 12/18/2019  . Urinary frequency 09/16/2019  . Vaginal itching 09/16/2019  . Nausea without vomiting 08/18/2019  . Gastroesophageal reflux disease 08/18/2019  . Loss of weight 08/18/2019  . Fatty liver 08/18/2019  . Early satiety 08/18/2019  . Encounter for gynecological examination with Papanicolaou smear of cervix 07/25/2018  . Traumatic rhabdomyolysis (HCC) 07/12/2015  . Rhabdomyolysis 07/12/2015  . BMI (body mass index), pediatric, 95-99% for age 35/16/2014  . 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 Bilateral chronic knee pain 04/16/2012    Past Surgical History:  Procedure Laterality Date  . BIOPSY  09/11/2019   Procedure: BIOPSY;  Surgeon: Lanelle Bal, DO;  Location: [facility];  Service: Endoscopy;;  . ESOPHAGOGASTRODUODENOSCOPY (EGD) WITH PROPOFOL N/A 09/11/2019   Procedure: ESOPHAGOGASTRODUODENOSCOPY (EGD) WITH PROPOFOL;  Surgeon: Lanelle Bal, DO;  Esophageal mucosal changes suspicious for eosinophilic esophagitis s/p biopsy, gastritis s/p biopsy, normal examined duodenum biopsy.  All pathology was benign.     OB History    Gravida  0   Para  0   Term  0   Preterm  0   AB  0   Living  0     SAB  0   IAB  0   Ectopic  0   Multiple  0   Live Births  0            Home Medications    Prior to Admission medications   Medication Sig Start Date End Date Taking? Authorizing Provider  calcium carbonate (TUMS - DOSED IN MG ELEMENTAL CALCIUM) 500 MG chewable tablet Chew 1 tablet by mouth daily. As needed    [provider]  etonogestrel (NEXPLANON) 68 MG IMPL implant Inject 68 mg into the skin once.     [provider]  fluconazole (DIFLUCAN) 150 MG tablet Take one tablet at the onset of symptoms. If symptoms are still present 3 days later, take the second tablet. Patient not taking: No sig reported 05/08/20   Moshe Cipro, NP  ibuprofen (ADVIL) 200 MG tablet Take 200 mg by mouth every 6 (six) hours as needed.    [provider]  Nitroglycerin (RECTIV) 0.4 % OINT  Place 1 inch rectally in the morning and at bedtime. Patient taking differently: Place 1 inch rectally in the morning and at bedtime. As needed 03/17/20   Letta Median, PA-C  omeprazole (PRILOSEC) 40 MG capsule Take 1 capsule (40 mg total) by mouth 2 (two) times daily before a meal. 04/29/20   Letta Median, PA-C    Family History Family History  Problem Relation Age of Onset  . Autism Sister        1 sister    Social History Social History   Tobacco Use  . Smoking status: Current Every Day Smoker    Packs/day: 0.50    Types: Cigarettes  . Smokeless tobacco: Never Used  . Tobacco comment: smokes 1-2 cig daily  Vaping Use  .  Vaping Use: Former  Substance Use Topics  . Alcohol use: No  . Drug use: No     Allergies   Flagyl [metronidazole]   Review of Systems Review of Systems   Physical Exam Triage Vital Signs ED Triage Vitals  Enc Vitals Group     BP 06/08/20 1314 130/77     Pulse Rate 06/08/20 1314 92     Resp 06/08/20 1314 16     Temp 06/08/20 1314 98.3 F (36.8 C)     Temp Source 06/08/20 1314 Oral     SpO2 06/08/20 1314 97 %     Weight --      Height --      Head Circumference --      Peak Flow --      Pain Score 06/08/20 1315 7     Pain Loc --      Pain Edu? --      Excl. in GC? --    No data found.  Updated Vital Signs BP 130/77 (BP Location: Right Arm)   Pulse 92   Temp 98.3 F (36.8 C) (Oral)   Resp 16   SpO2 97%   Visual Acuity Right Eye Distance:   Left Eye Distance:   Bilateral Distance:    Right Eye Near:   Left Eye Near:    Bilateral Near:     Physical Exam Vitals and nursing note reviewed.  Constitutional:      General: She is not in acute distress.    Appearance: She is well-developed. She is not ill-appearing.  HENT:     Head: Normocephalic and atraumatic.     Nose: Nose normal.     Mouth/Throat:     Mouth: Mucous membranes are moist.     Pharynx: Oropharynx is clear.  Eyes:     Extraocular Movements: Extraocular movements intact.     Conjunctiva/sclera: Conjunctivae normal.     Pupils: Pupils are equal, round, and reactive to light.     Comments: Erythematous vesicular rash surrounding left eyelid and eyebrow, no drainage noted  Cardiovascular:     Rate and Rhythm: Normal rate and regular rhythm.  Pulmonary:     Effort: Pulmonary effort is normal. No respiratory distress.  Musculoskeletal:        General: Normal range of motion.     Cervical back: Normal range of motion and neck supple.  Skin:    General: Skin is warm and dry.     Capillary Refill: Capillary refill takes less than 2 seconds.  Neurological:     General: No focal deficit  present.     Mental Status: She is alert and oriented to person, place, and time.  Psychiatric:  Mood and Affect: Mood normal.        Behavior: Behavior normal.        Thought Content: Thought content normal.      UC Treatments / Results  Labs (all labs ordered are listed, but only abnormal results are displayed) Labs Reviewed - No data to display  EKG   Radiology No results found.  Procedures Procedures (including critical care time)  Medications Ordered in UC Medications  dexamethasone (DECADRON) injection 10 mg (10 mg Intramuscular Given 06/08/20 1358)    Initial Impression / Assessment and Plan / UC Course  I have reviewed the triage vital signs and the nursing notes.  Pertinent labs & imaging results that were available during my care of the patient were reviewed by me and considered in my medical decision making (see chart for details).    Allergic Contact Dermatitis Left eyelid swelling  Decadron 10mg  IM given in office today May take benadryl at night May use coconut oil or aloe gel as needed to the area Follow up with this office or with primary care if symptoms are persisting.  Follow up in the ER for high fever, trouble swallowing, trouble breathing, other concerning symptoms.   Final Clinical Impressions(s) / UC Diagnoses   Final diagnoses:  Swelling of left eyelid  Irritant contact dermatitis due to other chemical products     Discharge Instructions     You have received a steroid shot in the office for the swelling  May take benadryl as needed  May use ice to the area as needed  Follow up with this office or with primary care if symptoms are persisting.  Follow up in the ER for high fever, trouble swallowing, trouble breathing, other concerning symptoms.     ED Prescriptions    None     PDMP not reviewed this encounter.   June, NP 06/14/20 1010

## 2020-06-17 ENCOUNTER — Other Ambulatory Visit (HOSPITAL_COMMUNITY)
Admission: RE | Admit: 2020-06-17 | Discharge: 2020-06-17 | Disposition: A | Discharge status: Home / Self Care | Payer: Medicaid Other | Source: Ambulatory Visit | Attending: Obstetrics & Gynecology | Admitting: Obstetrics & Gynecology

## 2020-06-17 ENCOUNTER — Other Ambulatory Visit (INDEPENDENT_AMBULATORY_CARE_PROVIDER_SITE_OTHER): Payer: Medicaid Other | Admitting: *Deleted

## 2020-06-17 ENCOUNTER — Other Ambulatory Visit: Payer: Self-pay

## 2020-06-17 DIAGNOSIS — Z113 Encounter for screening for infections with a predominantly sexual mode of transmission: Secondary | ICD-10-CM | POA: Diagnosis not present

## 2020-06-17 NOTE — Progress Notes (Signed)
   NURSE VISIT- VAGINITIS/STD/POC  SUBJECTIVE:  Kimberly Norris is a 25 y.o. G0P0000 GYN patientfemale here for a vaginal swab for STD screen.  She reports the following symptoms: none for 0 days. Denies abnormal vaginal bleeding, significant pelvic pain, fever, or UTI symptoms.  OBJECTIVE:  There were no vitals taken for this visit.  Appears well, in no apparent distress  ASSESSMENT: Vaginal swab for STD screen  PLAN: Self-collected vaginal probe for Gonorrhea, Chlamydia, Trichomonas, Bacterial Vaginosis, Yeast sent to lab Treatment: to be determined once results are received Follow-up as needed if symptoms persist/worsen, or new symptoms develop  Annamarie Dawley  06/17/2020 10:10 AM

## 2020-06-17 NOTE — Progress Notes (Signed)
Chart reviewed for nurse visit. Agree with plan of care.  Adline Potter, NP 06/17/2020 12:36 PM

## 2020-06-18 LAB — CERVICOVAGINAL ANCILLARY ONLY
Bacterial Vaginitis (gardnerella): NEGATIVE
Candida Glabrata: NEGATIVE
Candida Vaginitis: NEGATIVE
Chlamydia: NEGATIVE
Comment: NEGATIVE
Comment: NEGATIVE
Comment: NEGATIVE
Comment: NEGATIVE
Comment: NEGATIVE
Comment: NORMAL
Neisseria Gonorrhea: NEGATIVE
Trichomonas: NEGATIVE

## 2020-07-14 ENCOUNTER — Other Ambulatory Visit: Payer: Self-pay

## 2020-07-14 ENCOUNTER — Ambulatory Visit: Payer: Medicaid Other | Admitting: Gastroenterology

## 2020-07-14 ENCOUNTER — Other Ambulatory Visit (INDEPENDENT_AMBULATORY_CARE_PROVIDER_SITE_OTHER): Payer: Medicaid Other | Admitting: *Deleted

## 2020-07-14 ENCOUNTER — Other Ambulatory Visit (HOSPITAL_COMMUNITY)
Admission: RE | Admit: 2020-07-14 | Discharge: 2020-07-14 | Disposition: A | Discharge status: Home / Self Care | Payer: Medicaid Other | Source: Ambulatory Visit | Attending: Obstetrics & Gynecology | Admitting: Obstetrics & Gynecology

## 2020-07-14 DIAGNOSIS — N9489 Other specified conditions associated with female genital organs and menstrual cycle: Secondary | ICD-10-CM

## 2020-07-14 DIAGNOSIS — N949 Unspecified condition associated with female genital organs and menstrual cycle: Secondary | ICD-10-CM | POA: Insufficient documentation

## 2020-07-14 DIAGNOSIS — N898 Other specified noninflammatory disorders of vagina: Secondary | ICD-10-CM | POA: Diagnosis not present

## 2020-07-14 NOTE — Progress Notes (Signed)
Chart reviewed for nurse visit. Agree with plan of care.  Adline Potter, NP 07/14/2020 5:12 PM

## 2020-07-14 NOTE — Progress Notes (Signed)
   NURSE VISIT- VAGINITIS/STD/POC  SUBJECTIVE:  Kimberly Norris is a 25 y.o. G0P0000 GYN patient female here for a vaginal swab for vaginitis screening, STD screen.  She reports the following symptoms:  vaginal itching & burning  for 1-2 weeks. Denies abnormal vaginal bleeding, significant pelvic pain, fever, or UTI symptoms.  OBJECTIVE:  There were no vitals taken for this visit.  Appears well, in no apparent distress  ASSESSMENT: Vaginal swab for vaginitis screening & STD screen  PLAN: Self-collected vaginal probe for Gonorrhea, Chlamydia, Trichomonas, Bacterial Vaginosis, Yeast sent to lab Treatment: to be determined once results are received Follow-up as needed if symptoms persist/worsen, or new symptoms develop  Malachy Mood  07/14/2020 3:29 PM

## 2020-07-16 LAB — CERVICOVAGINAL ANCILLARY ONLY
Bacterial Vaginitis (gardnerella): NEGATIVE
Candida Glabrata: NEGATIVE
Candida Vaginitis: NEGATIVE
Chlamydia: NEGATIVE
Comment: NEGATIVE
Comment: NEGATIVE
Comment: NEGATIVE
Comment: NEGATIVE
Comment: NEGATIVE
Comment: NORMAL
Neisseria Gonorrhea: NEGATIVE
Trichomonas: NEGATIVE

## 2020-08-19 ENCOUNTER — Encounter: Payer: Self-pay | Admitting: Emergency Medicine

## 2020-08-19 ENCOUNTER — Other Ambulatory Visit: Payer: Self-pay

## 2020-08-19 ENCOUNTER — Ambulatory Visit
Admission: EM | Admit: 2020-08-19 | Discharge: 2020-08-19 | Disposition: A | Discharge status: Home / Self Care | Payer: Medicaid Other | Attending: Emergency Medicine | Admitting: Emergency Medicine

## 2020-08-19 DIAGNOSIS — R6889 Other general symptoms and signs: Secondary | ICD-10-CM

## 2020-08-19 DIAGNOSIS — G4489 Other headache syndrome: Secondary | ICD-10-CM

## 2020-08-19 LAB — POCT URINALYSIS DIP (MANUAL ENTRY)
Bilirubin, UA: NEGATIVE
Glucose, UA: NEGATIVE mg/dL
Ketones, POC UA: NEGATIVE mg/dL
Leukocytes, UA: NEGATIVE
Nitrite, UA: NEGATIVE
Protein Ur, POC: 30 mg/dL — AB
Spec Grav, UA: >=1.03 — AB (ref 1.010–1.025)
Urobilinogen, UA: 0.2 E.U./dL
pH, UA: 6 (ref 5.0–8.0)

## 2020-08-19 LAB — POCT URINE PREGNANCY: Preg Test, Ur: NEGATIVE

## 2020-08-19 MED ORDER — ONDANSETRON 4 MG PO TBDP
4.0000 mg | ORAL_TABLET | Freq: Once | ORAL | Status: AC
Start: 2020-08-19 — End: 2020-08-19
  Administered 2020-08-19: 4 mg via ORAL

## 2020-08-19 MED ORDER — DEXAMETHASONE SODIUM PHOSPHATE 10 MG/ML IJ SOLN
10.0000 mg | Freq: Once | INTRAMUSCULAR | Status: AC
  Administered 2020-08-19: 10 mg via INTRAMUSCULAR

## 2020-08-19 MED ORDER — KETOROLAC TROMETHAMINE 30 MG/ML IJ SOLN
30.0000 mg | Freq: Once | INTRAMUSCULAR | Status: AC
  Administered 2020-08-19: 30 mg via INTRAMUSCULAR

## 2020-08-19 MED ORDER — PREDNISONE 20 MG PO TABS: 20.0000 mg | ORAL_TABLET | Freq: Two times a day (BID) | ORAL | 0 refills | Status: AC

## 2020-08-19 NOTE — Discharge Instructions (Addendum)
Migraine cocktail given Urine without signs of infection  COVID testing ordered.  It will take between 5-7 days for test results.  Someone will contact you regarding abnormal results.   In the meantime: You should remain isolated in your home for 5 days from symptom onset AND greater than 72 hours after symptoms resolution (absence of fever without the use of fever-reducing medication and improvement in respiratory symptoms), whichever is longer Get plenty of rest and push fluids Prednisone prescribed.  Take as directed and to completion Use OTC medications like ibuprofen or tylenol as needed fever or pain Call or go to the ED if you have any new or worsening symptoms such as fever, worsening cough, shortness of breath, chest tightness, chest pain, turning blue, changes in mental status, etc..Marland Kitchen

## 2020-08-19 NOTE — ED Provider Notes (Signed)
[facility]   [id number] 08/19/20 Arrival Time: 1443   CC: COVID symptoms  SUBJECTIVE: History from: patient.  Kimberly Norris is a 25 y.o. female who presents with headache x 2 weeks.  Body aches x 1 day and throat feels swollen that began this morning.  Denies sick exposure to COVID, flu or strep.  Has tried OTC medications without relief.  Denies aggravating factors.  Denies previous symptoms in the past.   Also reports increased urination.  Denies fever, chills, SOB, wheezing, chest pain, changes in bowel or bladder habits.    ROS: As per HPI.  All other pertinent ROS negative.     Past Medical History:  Diagnosis Date   Bilateral chronic knee pain 04/16/2012   Heartburn    Liver disease    fatty liver   Mental disorder    anxiety, depression   Rhabdomyolysis    Past Surgical History:  Procedure Laterality Date   BIOPSY  09/11/2019   Procedure: BIOPSY;  Surgeon: Lanelle Bal, DO;  Location: [facility];  Service: Endoscopy;;   ESOPHAGOGASTRODUODENOSCOPY (EGD) WITH PROPOFOL N/A 09/11/2019   Procedure: ESOPHAGOGASTRODUODENOSCOPY (EGD) WITH PROPOFOL;  Surgeon: Lanelle Bal, DO;  Esophageal mucosal changes suspicious for eosinophilic esophagitis s/p biopsy, gastritis s/p biopsy, normal examined duodenum biopsy.  All pathology was benign.    Allergies  Allergen Reactions   Other Anaphylaxis    Lilly flowers   Flagyl [Metronidazole] Rash   Tinidazole Rash   No current facility-administered medications on file prior to encounter.   Current Outpatient Medications on File Prior to Encounter  Medication Sig Dispense Refill   calcium carbonate (TUMS - DOSED IN MG ELEMENTAL CALCIUM) 500 MG chewable tablet Chew 1 tablet by mouth daily. As needed     etonogestrel (NEXPLANON) 68 MG IMPL implant Inject 68 mg into the skin once.      omeprazole (PRILOSEC) 40 MG capsule Take 1 capsule (40 mg total) by mouth 2 (two) times daily before a meal. (Patient taking  differently: Take 40 mg by mouth as needed.) 60 capsule 3   Social History   Socioeconomic History   Marital status: Single    Spouse name: Not on file   Number of children: Not on file   Years of education: Not on file   Highest education level: Not on file  Occupational History   Not on file  Tobacco Use   Smoking status: Every Day    Packs/day: 0.50    Types: Cigarettes   Smokeless tobacco: Never   Tobacco comments:    smokes 1-2 cig daily  Vaping Use   Vaping Use: Former  Substance and Sexual Activity   Alcohol use: No   Drug use: No   Sexual activity: Yes    Birth control/protection: Implant  Other Topics Concern   Not on file  Social History Narrative   Not on file   Social Determinants of Health   Financial Resource Strain: Not on file  Food Insecurity: Not on file  Transportation Needs: Not on file  Physical Activity: Not on file  Stress: Not on file  Social Connections: Not on file  Intimate Partner Violence: Not on file   Family History  Problem Relation Age of Onset   Autism Sister        1 sister    OBJECTIVE:  Vitals:   08/19/20 1542  BP: 107/72  Pulse: 86  Resp: 18  Temp: 98.9 F (37.2 C)  TempSrc: Oral  SpO2: 98%     General appearance: alert; appears fatigued, but nontoxic; speaking in full sentences and tolerating own secretions HEENT: NCAT; Ears: EACs clear, TMs pearly gray; Eyes: PERRL.  EOM grossly intact. Nose: nares patent without rhinorrhea, Throat: oropharynx clear, tonsils non erythematous or enlarged, uvula midline  Neck: supple without LAD Lungs: unlabored respirations, symmetrical air entry; cough: absent; no respiratory distress; CTAB Heart: regular rate and rhythm.  Skin: warm and dry Psychological: alert and cooperative; normal mood and affect  LABS:  Results for orders placed or performed during the hospital encounter of 08/19/20 (from the past 24 hour(s))  POCT urinalysis dipstick     Status: Abnormal    Collection Time: 08/19/20  3:46 PM  Result Value Ref Range   Color, UA yellow yellow   Clarity, UA clear clear   Glucose, UA negative negative mg/dL   Bilirubin, UA negative negative   Ketones, POC UA negative negative mg/dL   Spec Grav, UA >=5.993 (A) 1.010 - 1.025   Blood, UA trace-intact (A) negative   pH, UA 6.0 5.0 - 8.0   Protein Ur, POC =30 (A) negative mg/dL   Urobilinogen, UA 0.2 0.2 or 1.0 E.U./dL   Nitrite, UA Negative Negative   Leukocytes, UA Negative Negative  POCT urine pregnancy     Status: None   Collection Time: 08/19/20  3:46 PM  Result Value Ref Range   Preg Test, Ur Negative Negative     ASSESSMENT & PLAN:  1. Flu-like symptoms   2. Other headache syndrome     Meds ordered this encounter  Medications   predniSONE (DELTASONE) 20 MG tablet    Sig: Take 1 tablet (20 mg total) by mouth 2 (two) times daily with a meal for 5 days.    Dispense:  10 tablet    Refill:  0    Order Specific Question:   Supervising Provider    Answer:   Eustace Moore [[id number]]   ketorolac (TORADOL) 30 MG/ML injection 30 mg   dexamethasone (DECADRON) injection 10 mg   ondansetron (ZOFRAN-ODT) disintegrating tablet 4 mg   Migraine cocktail given Urine without signs of infection  COVID testing ordered.  It will take between 5-7 days for test results.  Someone will contact you regarding abnormal results.   In the meantime: You should remain isolated in your home for 5 days from symptom onset AND greater than 72 hours after symptoms resolution (absence of fever without the use of fever-reducing medication and improvement in respiratory symptoms), whichever is longer Get plenty of rest and push fluids Prednisone prescribed.  Take as directed and to completion Use OTC medications like ibuprofen or tylenol as needed fever or pain Call or go to the ED if you have any new or worsening symptoms such as fever, worsening cough, shortness of breath, chest tightness, chest pain, turning  blue, changes in mental status, etc...   Reviewed expectations re: course of current medical issues. Questions answered. Outlined signs and symptoms indicating need for more acute intervention. Patient verbalized understanding. After Visit Summary given.          Rennis Harding, PA-C 08/19/20 1605

## 2020-08-19 NOTE — ED Triage Notes (Addendum)
Headaches x 2 weeks. Body aches started yesterday and throat feels like is swollen started this morning.  Body aches are mainly in lower back.  Reports increased urination.

## 2020-08-20 LAB — COVID-19, FLU A+B NAA
Influenza A, NAA: NOT DETECTED
Influenza B, NAA: NOT DETECTED
SARS-CoV-2, NAA: DETECTED — AB

## 2020-08-21 LAB — URINE CULTURE
Culture: 30000 — AB
Special Requests: NORMAL

## 2020-08-22 ENCOUNTER — Other Ambulatory Visit: Payer: Self-pay

## 2020-08-22 ENCOUNTER — Ambulatory Visit
Admission: EM | Admit: 2020-08-22 | Discharge: 2020-08-22 | Disposition: A | Discharge status: Home / Self Care | Payer: Medicaid Other | Attending: Family Medicine | Admitting: Family Medicine

## 2020-08-22 ENCOUNTER — Encounter: Payer: Self-pay | Admitting: Emergency Medicine

## 2020-08-22 DIAGNOSIS — H9203 Otalgia, bilateral: Secondary | ICD-10-CM

## 2020-08-22 DIAGNOSIS — R0981 Nasal congestion: Secondary | ICD-10-CM | POA: Diagnosis not present

## 2020-08-22 MED ORDER — CETIRIZINE HCL 10 MG PO TABS: 10.0000 mg | ORAL_TABLET | Freq: Every day | ORAL | 11 refills | Status: DC

## 2020-08-22 MED ORDER — PSEUDOEPHEDRINE HCL ER 120 MG PO TB12: 120.0000 mg | ORAL_TABLET | Freq: Two times a day (BID) | ORAL | 0 refills | Status: DC | PRN

## 2020-08-22 NOTE — ED Triage Notes (Signed)
Pt here COVID + with bilateral ear pain since this morning. Mostly pressure. Nasal congestion still present.

## 2020-08-22 NOTE — ED Provider Notes (Signed)
[facility]    CSN: [id number] Arrival date & time: 08/22/20  1310      History   Chief Complaint Chief Complaint  Patient presents with   Otalgia    HPI Kimberly Norris is a 25 y.o. female.   HPI Patient seen here at urgent care on 08/19/2020 and subsequently has tested positive for COVID.  She is in today with bilateral ear pain and continued nasal congestion.  She was prescribed oral prednisone however reports that she became severely nauseous even with taking medication with food and was unable to tolerate taking any additional doses.  She has not taken any additional medication and she is afebrile.  Past Medical History:  Diagnosis Date   Bilateral chronic knee pain 04/16/2012   Heartburn    Liver disease    fatty liver   Mental disorder    anxiety, depression   Rhabdomyolysis     Patient Active Problem List   Diagnosis Date Noted   Adjustment disorder with emotional disturbance    Self-injurious behavior    Dysphagia 04/29/2020   Abdominal wall bulge 04/29/2020   Alternating constipation and diarrhea 03/17/2020   RUQ abdominal pain 03/17/2020   Anal fissure 02/17/2020   Skin tag of perianal region 02/17/2020   Cold sore 12/18/2019   Screen for STD (sexually transmitted disease) 12/18/2019   Urinary frequency 09/16/2019   Vaginal itching 09/16/2019   Nausea without vomiting 08/18/2019   Gastroesophageal reflux disease 08/18/2019   Loss of weight 08/18/2019   Fatty liver 08/18/2019   Early satiety 08/18/2019   Encounter for gynecological examination with Papanicolaou smear of cervix 07/25/2018   Traumatic rhabdomyolysis (HCC) 07/12/2015   Rhabdomyolysis 07/12/2015   BMI (body mass index), pediatric, 95-99% for age 68/16/2014   Acquired genu valgum, 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  All pathology was benign.     OB History     Gravida  0   Para  0   Term  0   Preterm  0   AB  0   Living  0      SAB  0   IAB  0   Ectopic  0   Multiple  0   Live Births  0            Home Medications    Prior to Admission medications   Medication Sig Start Date End Date Taking? Authorizing Provider  cetirizine (ZYRTEC) 10 MG tablet Take 1 tablet (10 mg total) by mouth daily. 08/22/20  Yes Bing Neighbors, FNP  pseudoephedrine (SUDAFED 12 HOUR) 120 MG 12 hr tablet Take 1 tablet (120 mg total) by mouth every 12 (twelve) hours as needed for congestion. 08/22/20  Yes Bing Neighbors, FNP  calcium carbonate (TUMS - DOSED IN MG ELEMENTAL CALCIUM) 500 MG chewable tablet Chew 1 tablet by mouth daily. As needed    [provider]  etonogestrel (NEXPLANON) 68 MG IMPL implant Inject 68 mg into the skin once.     [provider]  omeprazole (PRILOSEC) 40  MG capsule Take 1 capsule (40 mg total) by mouth 2 (two) times daily before a meal. Patient taking differently: Take 40 mg by mouth as needed. 04/29/20   Letta Median, PA-C  predniSONE (DELTASONE) 20 MG tablet Take 1 tablet (20 mg total) by mouth 2 (two) times daily with a meal for 5 days. 08/19/20 08/24/20  Rennis Harding, PA-C    Family History Family History  Problem Relation Age of Onset   Autism Sister        1 sister    Social History Social History   Tobacco Use   Smoking status: Every Day    Packs/day: 0.50    Types: Cigarettes   Smokeless tobacco: Never   Tobacco comments:    smokes 1-2 cig daily  Vaping Use   Vaping Use: Former  Substance Use  Topics   Alcohol use: No   Drug use: No     Allergies   Other, Flagyl [metronidazole], and Tinidazole   Review of Systems Review of Systems Pertinent negatives listed in HPI   Physical Exam Triage Vital Signs ED Triage Vitals  Enc Vitals Group     BP 08/22/20 1405 115/77     Pulse Rate 08/22/20 1405 87     Resp 08/22/20 1405 20     Temp 08/22/20 1405 98.2 F (36.8 C)     Temp Source 08/22/20 1405 Oral     SpO2 08/22/20 1405 98 %     Weight --      Height --      Head Circumference --      Peak Flow --      Pain Score 08/22/20 1406 8     Pain Loc --      Pain Edu? --      Excl. in GC? --    No data found.  Updated Vital Signs BP 115/77   Pulse 87   Temp 98.2 F (36.8 C) (Oral)   Resp 20   SpO2 98%   Visual Acuity Right Eye Distance:   Left Eye Distance:   Bilateral Distance:    Right Eye Near:   Left Eye Near:    Bilateral Near:     Physical Exam  General Appearance:    Alert, cooperative, no distress  HENT:   Normocephalic, ears normal, nares mucosal edema with congestion, rhinorrhea, oropharynx    Eyes:    PERRL, conjunctiva/corneas clear, EOM's intact       Lungs:     Clear to auscultation bilaterally, respirations unlabored  Heart:    Regular rate and rhythm  Neurologic:   Awake, alert, oriented x 3. No apparent focal neurological           defect.      UC Treatments / Results  Labs (all labs ordered are listed, but only abnormal results are displayed) Labs Reviewed - No data to display  EKG   Radiology No results found.  Procedures Procedures (including critical care time)  Medications Ordered in UC Medications - No data to display  Initial Impression / Assessment and Plan / UC Course  I have reviewed the triage vital signs and the nursing notes.  Pertinent labs & imaging results that were available during my care of the patient were reviewed by me and considered in my medical decision making (see chart for details).    Nasal  congestion and acute otalgia related to active COVID-19 virus.  Given unable to tolerate prednisone will trial Sudafed 120 mg  twice daily as needed along with cetirizine as needed for nasal congestion and nasal drainage.  This will also help with otalgia related to congested ears.  Advised that symptoms can take anywhere from 7 to 14 days to completely remit given that this is an active virus.  Advised to continue symptomatic management.  Work note provided patient's quarantine will end on 08/24/2020. Final Clinical Impressions(s) / UC Diagnoses   Final diagnoses:  Nasal congestion  Acute otalgia, bilateral   Discharge Instructions   None    ED Prescriptions     Medication Sig Dispense Auth. Provider   pseudoephedrine (SUDAFED 12 HOUR) 120 MG 12 hr tablet Take 1 tablet (120 mg total) by mouth every 12 (twelve) hours as needed for congestion. 14 tablet Bing Neighbors, FNP   cetirizine (ZYRTEC) 10 MG tablet Take 1 tablet (10 mg total) by mouth daily. 30 tablet Bing Neighbors, FNP      PDMP not reviewed this encounter.   Bing Neighbors, FNP 08/22/20 [phone]

## 2020-08-27 ENCOUNTER — Ambulatory Visit
Admission: EM | Admit: 2020-08-27 | Discharge: 2020-08-27 | Disposition: A | Discharge status: Home / Self Care | Payer: Medicaid Other | Attending: Emergency Medicine | Admitting: Emergency Medicine

## 2020-08-27 ENCOUNTER — Encounter: Payer: Self-pay | Admitting: Emergency Medicine

## 2020-08-27 ENCOUNTER — Other Ambulatory Visit: Payer: Self-pay

## 2020-08-27 DIAGNOSIS — R0981 Nasal congestion: Secondary | ICD-10-CM | POA: Diagnosis not present

## 2020-08-27 DIAGNOSIS — J019 Acute sinusitis, unspecified: Secondary | ICD-10-CM

## 2020-08-27 MED ORDER — AMOXICILLIN-POT CLAVULANATE 875-125 MG PO TABS: 1.0000 | ORAL_TABLET | Freq: Two times a day (BID) | ORAL | 0 refills | Status: AC

## 2020-08-27 MED ORDER — DEXAMETHASONE SODIUM PHOSPHATE 10 MG/ML IJ SOLN
10.0000 mg | Freq: Once | INTRAMUSCULAR | Status: AC
  Administered 2020-08-27: 10 mg via INTRAMUSCULAR

## 2020-08-27 NOTE — ED Triage Notes (Signed)
Recent covid positive.  Unable to get rid of nasal congestion and can't hear well out of ears.  Continues to cough.

## 2020-08-27 NOTE — ED Provider Notes (Signed)
Kimberly Norris   [id number] 08/27/20 Arrival Time: 1822   CC: COVID symptoms  SUBJECTIVE: History from: patient.  Kimberly Norris is a 25 y.o. female who presents with sinus congestion, ear pressure, and cough x 5 days  Tested positive for covid.  Has tried OTC medications without relief.  Denies aggravating factors.  Reports previous symptoms in the past.  Reports migraines and chest heaviness as well.   Denies fever, SOB, wheezing, chest pain, nausea, changes in bowel or bladder habits.    ROS: As per HPI.  All other pertinent ROS negative.     Past Medical History:  Diagnosis Date   Bilateral chronic knee pain 04/16/2012   Heartburn    Liver disease    fatty liver   Mental disorder    anxiety, depression   Rhabdomyolysis    Past Surgical History:  Procedure Laterality Date   BIOPSY  09/11/2019   Procedure: BIOPSY;  Surgeon: Lanelle Bal, DO;  [facility];  Service: Endoscopy;;   ESOPHAGOGASTRODUODENOSCOPY (EGD) WITH PROPOFOL N/A 09/11/2019   Procedure: ESOPHAGOGASTRODUODENOSCOPY (EGD) WITH PROPOFOL;  Surgeon: Lanelle Bal, DO;  Esophageal mucosal changes suspicious for eosinophilic esophagitis s/p biopsy, gastritis s/p biopsy, normal examined duodenum biopsy.  All pathology was benign.    Allergies  Allergen Reactions   Other Anaphylaxis    Lilly flowers   Flagyl [Metronidazole] Rash   Tinidazole Rash   No current facility-administered medications on file prior to encounter.   Current Outpatient Medications on File Prior to Encounter  Medication Sig Dispense Refill   calcium carbonate (TUMS - DOSED IN MG ELEMENTAL CALCIUM) 500 MG chewable tablet Chew 1 tablet by mouth daily. As needed     cetirizine (ZYRTEC) 10 MG tablet Take 1 tablet (10 mg total) by mouth daily. 30 tablet 11   etonogestrel (NEXPLANON) 68 MG IMPL implant Inject 68 mg into the skin once.      omeprazole (PRILOSEC) 40 MG capsule Take 1 capsule (40 mg total) by mouth 2 (two)  times daily before a meal. (Patient taking differently: Take 40 mg by mouth as needed.) 60 capsule 3   pseudoephedrine (SUDAFED 12 HOUR) 120 MG 12 hr tablet Take 1 tablet (120 mg total) by mouth every 12 (twelve) hours as needed for congestion. 14 tablet 0   Social History   Socioeconomic History   Marital status: Single    Spouse name: Not on file   Number of children: Not on file   Years of education: Not on file   Highest education level: Not on file  Occupational History   Not on file  Tobacco Use   Smoking status: Every Day    Packs/day: 0.50    Types: Cigarettes   Smokeless tobacco: Never   Tobacco comments:    smokes 1-2 cig daily  Vaping Use   Vaping Use: Former  Substance and Sexual Activity   Alcohol use: No   Drug use: No   Sexual activity: Yes    Birth control/protection: Implant  Other Topics Concern   Not on file  Social History Narrative   Not on file   Social Determinants of Health   Financial Resource Strain: Not on file  Food Insecurity: Not on file  Transportation Needs: Not on file  Physical Activity: Not on file  Stress: Not on file  Social Connections: Not on file  Intimate Partner Violence: Not on file   Family History  Problem Relation Age of Onset   Autism  Sister        1 sister    OBJECTIVE:  Vitals:   08/27/20 1827  BP: 119/68  Pulse: 84  Resp: 16  Temp: 98 F (36.7 C)  TempSrc: Oral  SpO2: 98%    General appearance: alert; fatigued appearing, nontoxic; speaking in full sentences and tolerating own secretions HEENT: NCAT; Ears: EACs clear, TMs pearly gray; Eyes: PERRL.  EOM grossly intact.Nose: nares patent without rhinorrhea, Throat: oropharynx clear, tonsils non erythematous or enlarged, uvula midline  Neck: supple without LAD Lungs: unlabored respirations, symmetrical air entry; cough: absent; no respiratory distress; CTAB Heart: regular rate and rhythm.  Skin: warm and dry Psychological: alert and cooperative; normal  mood and affect   ASSESSMENT & PLAN:  1. Sinus congestion   2. Acute non-recurrent sinusitis, unspecified location     Meds ordered this encounter  Medications   amoxicillin-clavulanate (AUGMENTIN) 875-125 MG tablet    Sig: Take 1 tablet by mouth every 12 (twelve) hours for 10 days.    Dispense:  20 tablet    Refill:  0    Order Specific Question:   Supervising Provider    Answer:   Eustace Moore [[id number]]   dexamethasone (DECADRON) injection 10 mg   Unable to rule out cardiac disease or blood clot in urgent care setting.  Offered patient further evaluation and management in the ED.  Patient declines at this time and would like to try outpatient therapy first.  Aware of the risk associated with this decision including missed diagnosis, organ damage, organ failure, and/or death.  Patient aware and in agreement.     Steroid shot given in office Get plenty of rest and push fluids Augmentin for sinus infection Use OTC zyrtec for nasal congestion, runny nose, and/or sore throat Use OTC flonase for nasal congestion and runny nose Use medications daily for symptom relief Use OTC medications like ibuprofen or tylenol as needed fever or pain Follow up with PCP or neurology for persistent worsening migraines Call or go to the ED if you have any new or worsening symptoms such as fever, worsening cough, shortness of breath, chest tightness, chest pain, turning blue, changes in mental status, etc...   Reviewed expectations re: course of current medical issues. Questions answered. Outlined signs and symptoms indicating need for more acute intervention. Patient verbalized understanding. After Visit Summary given.          Rennis Harding, PA-C 08/27/20 1847

## 2020-08-27 NOTE — Discharge Instructions (Addendum)
Unable to rule out cardiac disease or blood clot in urgent care setting.  Offered patient further evaluation and management in the ED.  Patient declines at this time and would like to try outpatient therapy first.  Aware of the risk associated with this decision including missed diagnosis, organ damage, organ failure, and/or death.  Patient aware and in agreement.     Steroid shot given in office Get plenty of rest and push fluids Augmentin for sinus infection Use OTC zyrtec for nasal congestion, runny nose, and/or sore throat Use OTC flonase for nasal congestion and runny nose Use medications daily for symptom relief Use OTC medications like ibuprofen or tylenol as needed fever or pain Follow up with PCP or neurology for persistent worsening migraines Call or go to the ED if you have any new or worsening symptoms such as fever, worsening cough, shortness of breath, chest tightness, chest pain, turning blue, changes in mental status, etc..Marland Kitchen

## 2020-08-30 NOTE — Progress Notes (Signed)
Referring Provider: Wilmon Pali, FNP Primary Care Physician:  Wilmon Pali, FNP Primary GI Physician: Dr. Marletta Lor  Chief Complaint  Patient presents with   Gastroesophageal Reflux    C/o nausea, vomiting, heartburn     HPI:   Kimberly Norris is a 25 y.o. female with history of fatty liver, RUQ abdominal pain, GERD, early satiety, nausea/vomiting, dysphagia, alternating constipation and diarrhea, and anal fissure.  She is presenting today for follow-up with chief complaint of uncontrolled GERD.   Prior GI evaluation has included RUQ ultrasound in May 2021 with gallbladder and CBD within normal limits, evidence of fatty liver.  CBC, CMP unremarkable in August 2021.  EGD August 2021 with esophageal mucosal changes suspicious for EOE though biopsy was benign, gastritis with benign biopsy negative for H. pylori, normal examined duodenum with benign biopsy.  Celiac screen negative.  TSH normal.  Last seen in our office 04/29/2020.  She was started on omeprazole 40 mg daily at the office visit in March.  Since then, she reported some improvement in RUQ abdominal pain, but still there after eating, typically following larger meals.  Felt bloated, like something was poking her.  Continued with early satiety.  Nausea improved but continues most every day, typically when waking in the morning.  Vomiting resolved.  GERD improved, but still with burning in her esophagus/throat daily.  Occasional dysphagia to chicken.  Denies routine NSAID use.  Bowels moving well.  Alternating constipation and diarrhea resolved.  Occasional toilet tissue hematochezia/rectal pain, but much improved.  She has an upcoming appointment with Dr. Henreitta Leber on 4/28 for follow-up of anal fissure.  Reported bulge to the right of her umbilicus x6+ months.  No appreciable hernia or mass on exam.  Reviewed prior CT from 2018 with Dr. Tyron Russell with no evidence of mass or hernia.  He recommended repeat CT without contrast.  Plan included  CT A/P without contrast, increase omeprazole to 40 mg twice daily, GES, continue Rectiv twice daily and add Benefiber daily, keep follow-up with Dr. Henreitta Leber.  CT completed 05/05/2020 with no findings to explain patient's abdominal bulge.  Suspect slight asymmetry of her abdomen is a benign variant related to subcutaneous fat deposition.  GES was not completed.  She canceled her appointment with Dr. Henreitta Leber.  Today:  Stopped omeprazole 40 mg about 1 month after I prescribed it last. Reflux seem to stop being a problem until she was recently diagnosed with COVID lung August 4.  Now having reflux daily with nausea and vomiting every morning. Stopped eating late.  Does not eat fried/fatty/greasy foods or spicy foods.  Vomiting as soon as she wakes up.  No further vomiting throughout the day.  No hematemesis.  Tums not helping.  Burning in her esophagus daily.  Burning in the upper abdomen when having reflux.  Prior RUQ abdominal pain has resolved.  Feels like it is hard to swallow, similar to prior dysphagia that previously resolved with better management of reflux.  Typically symptoms occur with meats.    Has migraines. Taking tylenol. No NSAIDs.   No brbpr or melena.  No constipation or diarrhea.  No weight loss.  Currently on antibiotics for congestion.  This is improving.  No fever or chills.  Past Medical History:  Diagnosis Date   Bilateral chronic knee pain 04/16/2012   Heartburn    Liver disease    fatty liver   Mental disorder    anxiety, depression   Rhabdomyolysis  Past Surgical History:  Procedure Laterality Date   BIOPSY  09/11/2019   Procedure: BIOPSY;  Surgeon: Lanelle Bal, DO;  [facility];  Service: Endoscopy;;   ESOPHAGOGASTRODUODENOSCOPY (EGD) WITH PROPOFOL N/A 09/11/2019   Procedure: ESOPHAGOGASTRODUODENOSCOPY (EGD) WITH PROPOFOL;  Surgeon: Lanelle Bal, DO;  Esophageal mucosal changes suspicious for eosinophilic esophagitis s/p biopsy, gastritis  s/p biopsy, normal examined duodenum biopsy.  All pathology was benign.     Current Outpatient Medications  Medication Sig Dispense Refill   acetaminophen (TYLENOL) 500 MG tablet Take 500 mg by mouth every 6 (six) hours as needed.     amoxicillin-clavulanate (AUGMENTIN) 875-125 MG tablet Take 1 tablet by mouth every 12 (twelve) hours for 10 days. 20 tablet 0   calcium carbonate (TUMS - DOSED IN MG ELEMENTAL CALCIUM) 500 MG chewable tablet Chew 1 tablet by mouth daily. As needed     etonogestrel (NEXPLANON) 68 MG IMPL implant Inject 68 mg into the skin once.      pantoprazole (PROTONIX) 40 MG tablet Take 1 tablet (40 mg total) by mouth daily before breakfast. 30 tablet 3   No current facility-administered medications for this visit.    Allergies as of 09/01/2020 - Review Complete 08/27/2020  Allergen Reaction Noted   Other Anaphylaxis 07/14/2020   Flagyl [metronidazole] Rash 03/06/2019   Tinidazole Rash 04/28/2020    Family History  Problem Relation Age of Onset   Autism Sister        1 sister    Social History   Socioeconomic History   Marital status: Single    Spouse name: Not on file   Number of children: Not on file   Years of education: Not on file   Highest education level: Not on file  Occupational History   Not on file  Tobacco Use   Smoking status: Every Day    Packs/day: 0.50    Types: Cigarettes   Smokeless tobacco: Never   Tobacco comments:    smokes 1-2 cig daily  Vaping Use   Vaping Use: Former  Substance and Sexual Activity   Alcohol use: No   Drug use: No   Sexual activity: Yes    Birth control/protection: Implant  Other Topics Concern   Not on file  Social History Narrative   Not on file   Social Determinants of Health   Financial Resource Strain: Not on file  Food Insecurity: Not on file  Transportation Needs: Not on file  Physical Activity: Not on file  Stress: Not on file  Social Connections: Not on file    Review of Systems: Gen:  See HPI CV: Denies chest pain, palpitations Resp: Denies dyspnea or cough GI: See HPI  Heme: See HPI  Physical Exam: BP 117/79   Pulse 71   Temp 97.8 F (36.6 C)   Ht 5\' 1"  (1.549 m)   Wt 190 lb 12.8 oz (86.5 kg)   BMI 36.05 kg/m  General:   Alert and oriented. No distress noted. Pleasant and cooperative.  Head:  Normocephalic and atraumatic. Eyes:  Conjuctiva clear without scleral icterus. Heart:  S1, S2 present without murmurs appreciated. Lungs:  Clear to auscultation bilaterally. No wheezes, rales, or rhonchi. No distress.  Abdomen:  +BS, soft, non-tender and non-distended. No rebound or guarding. No HSM or masses noted. Msk:  Symmetrical without gross deformities. Normal posture. Extremities:  Without edema. Neurologic:  Alert and  oriented x4 Psych:  Normal mood and affect.    Assessment: 25 year old female with  history of fatty liver, GERD, nausea/vomiting, early satiety, dysphagia, anal fissure presenting today for follow-up with chief complaint of uncontrolled GERD with associated nausea/vomiting, dysphagia, and early satiety.  Patient had previously PPI daily, increased to twice daily after her office visit in April 2022 due to breakthrough symptoms.  She discontinued PPI about 1 month after her last office visit as GERD seem to longer be a problem.  Since COVID diagnosis on 8/4, she reports uncontrolled GERD with associated nausea, vomiting in the morning, intermittent epigastric and esophageal burning, recurrent solid food dysphagia, and ongoing early satiety.  Denies BRBPR, melena, or weight loss.  EGD on file August 2021 for dysphagia with esophageal mucosal changes suspicious for EOE though biopsy was benign, gastritis with biopsy negative for H. pylori.  No dilation at that time.  Dysphagia symptoms previously resolved with better management of GERD.  We also had ordered gastric emptying study in April 2022, but patient has not had this completed.  We will plan to  start Protonix 40 mg daily.  Due to ongoing early satiety, cannot rule out component of gastroparesis which could also be influencing GERD, nausea/vomiting.  We will reschedule gastric emptying study.  Regarding dysphagia, I suspect this is likely secondary to uncontrolled GERD.  If symptoms do not improve with better management of GERD, would need to consider repeating EGD with dilation.   Plan: 1.  Gastric emptying study. 2.  Start Protonix 40 mg daily 30 minutes before breakfast. 3.  Counseled on GERD diet/lifestyle.  Written instructions provided. 4.  Requested progress report in 4 weeks. 5.  Consider EGD with dilation if dysphagia does not improve with better management of GERD. 6.  Follow-up January 2023.    Ermalinda Memos, PA-C [facility] 09/02/2020

## 2020-09-01 ENCOUNTER — Ambulatory Visit: Payer: Medicaid Other | Admitting: Gastroenterology

## 2020-09-01 ENCOUNTER — Other Ambulatory Visit: Payer: Self-pay

## 2020-09-01 ENCOUNTER — Encounter: Payer: Self-pay | Admitting: Gastroenterology

## 2020-09-01 VITALS — BP 117/79 | HR 71 | Temp 97.8°F | Ht 61.0 in | Wt 190.8 lb

## 2020-09-01 DIAGNOSIS — K2100 Gastro-esophageal reflux disease with esophagitis, without bleeding: Secondary | ICD-10-CM

## 2020-09-01 DIAGNOSIS — R110 Nausea: Secondary | ICD-10-CM

## 2020-09-01 DIAGNOSIS — R1310 Dysphagia, unspecified: Secondary | ICD-10-CM

## 2020-09-01 DIAGNOSIS — R6881 Early satiety: Secondary | ICD-10-CM | POA: Diagnosis not present

## 2020-09-01 MED ORDER — PANTOPRAZOLE SODIUM 40 MG PO TBEC: 40.0000 mg | DELAYED_RELEASE_TABLET | Freq: Every day | ORAL | 3 refills | Status: DC

## 2020-09-01 NOTE — Patient Instructions (Addendum)
We will reschedule your gastric emptying study for you.  Start Protonix 40 mg daily 30 minutes before breakfast.  Follow a GERD diet:  Avoid fried, fatty, greasy, spicy, citrus foods. Avoid caffeine and carbonated beverages. Avoid chocolate. Try eating 4-6 small meals a day rather than 3 large meals. Do not eat within 3 hours of laying down. Prop head of bed up on wood or bricks to create a 6 inch incline.  We will plan to follow-up with you in the office in January 2023.  Please call with a progress report in about 4 weeks on your acid reflux after you start Protonix.   It was good to see you again today!  Ermalinda Memos, PA-C [facility]

## 2020-09-02 ENCOUNTER — Encounter: Payer: Self-pay | Admitting: Gastroenterology

## 2020-09-07 ENCOUNTER — Other Ambulatory Visit (HOSPITAL_COMMUNITY): Payer: Medicaid Other

## 2020-10-11 ENCOUNTER — Encounter (HOSPITAL_COMMUNITY): Payer: Medicaid Other

## 2020-10-11 ENCOUNTER — Encounter (HOSPITAL_COMMUNITY): Payer: Self-pay

## 2020-10-12 ENCOUNTER — Other Ambulatory Visit: Payer: Self-pay

## 2020-10-12 ENCOUNTER — Ambulatory Visit: Payer: Medicaid Other | Admitting: General Surgery

## 2020-10-12 ENCOUNTER — Encounter: Payer: Self-pay | Admitting: Emergency Medicine

## 2020-10-12 ENCOUNTER — Ambulatory Visit (INDEPENDENT_AMBULATORY_CARE_PROVIDER_SITE_OTHER): Payer: Medicaid Other

## 2020-10-12 ENCOUNTER — Ambulatory Visit
Admission: EM | Admit: 2020-10-12 | Discharge: 2020-10-12 | Disposition: A | Discharge status: Home / Self Care | Payer: Medicaid Other | Attending: Physician Assistant | Admitting: Physician Assistant

## 2020-10-12 DIAGNOSIS — M79622 Pain in left upper arm: Secondary | ICD-10-CM | POA: Diagnosis not present

## 2020-10-12 DIAGNOSIS — W182XXA Fall in (into) shower or empty bathtub, initial encounter: Secondary | ICD-10-CM

## 2020-10-12 DIAGNOSIS — S40022A Contusion of left upper arm, initial encounter: Secondary | ICD-10-CM

## 2020-10-12 MED ORDER — IBUPROFEN 800 MG PO TABS: 800.0000 mg | ORAL_TABLET | Freq: Three times a day (TID) | ORAL | 0 refills | Status: DC | PRN

## 2020-10-12 MED ORDER — IBUPROFEN 800 MG PO TABS
800.0000 mg | ORAL_TABLET | Freq: Once | ORAL | Status: AC
  Administered 2020-10-12: 800 mg via ORAL

## 2020-10-12 NOTE — ED Provider Notes (Signed)
[facility]    CSN: [id number] Arrival date & time: 10/12/20  1519      History   Chief Complaint No chief complaint on file.   HPI Kimberly Norris is a 25 y.o. female.   Pt fell getting out of tub and hit left arm and shoulder  The history is provided by the patient. No language interpreter was used.  Arm Injury Pain details:    Quality:  Aching   Radiates to:  L upper arm   Severity:  Moderate   Onset quality:  Gradual   Duration:  1 day   Timing:  Constant Dislocation: no   Foreign body present:  No foreign bodies Relieved by:  Nothing Worsened by:  Nothing Ineffective treatments:  None tried Risk factors: concern for non-accidental trauma    Past Medical History:  Diagnosis Date   Bilateral chronic knee pain 04/16/2012   Heartburn    Liver disease    fatty liver   Mental disorder    anxiety, depression   Rhabdomyolysis     Patient Active Problem List   Diagnosis Date Noted   Adjustment disorder with emotional disturbance    Self-injurious behavior    Dysphagia 04/29/2020   Abdominal wall bulge 04/29/2020   Alternating constipation and diarrhea 03/17/2020   RUQ abdominal pain 03/17/2020   Anal fissure 02/17/2020   Skin tag of perianal region 02/17/2020   Cold sore 12/18/2019   Screen for STD (sexually transmitted disease) 12/18/2019   Urinary frequency 09/16/2019   Vaginal itching 09/16/2019   Nausea without vomiting 08/18/2019   Gastroesophageal reflux disease 08/18/2019   Loss of weight 08/18/2019   Fatty liver 08/18/2019   Early satiety 08/18/2019   Encounter for gynecological examination with Papanicolaou smear of cervix 07/25/2018   Traumatic rhabdomyolysis (HCC) 07/12/2015   Rhabdomyolysis 07/12/2015   BMI (body mass index), pediatric, 95-99% for age 67/16/2014   Acquired genu valgum, 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  All pathology was benign.     OB History     Gravida  0   Para  0   Term  0   Preterm  0   AB  0   Living  0      SAB  0   IAB  0   Ectopic  0   Multiple  0   Live Births  0            Home Medications    Prior to Admission medications   Medication Sig Start Date End Date Taking? Authorizing Provider  ibuprofen (ADVIL) 800 MG tablet Take 1 tablet (800 mg total) by mouth every 8 (eight) hours as needed. 10/12/20  Yes Elson Areas, PA-C  acetaminophen (TYLENOL) 500 MG tablet Take 500 mg by mouth every 6 (six) hours as needed.    [provider]  calcium carbonate (TUMS - DOSED IN MG ELEMENTAL CALCIUM) 500 MG chewable tablet Chew 1 tablet by mouth daily. As needed    [provider]  etonogestrel (NEXPLANON) 68 MG IMPL  implant Inject 68 mg into the skin once.     [provider]  pantoprazole (PROTONIX) 40 MG tablet Take 1 tablet (40 mg total) by mouth daily before breakfast. 09/01/20   Letta Median, PA-C    Family History Family History  Problem Relation Age of Onset   Autism Sister        1 sister    Social History Social History   Tobacco Use   Smoking status: Every Day    Packs/day: 0.50    Types: Cigarettes   Smokeless tobacco: Never   Tobacco comments:    smokes 1-2 cig daily  Vaping Use   Vaping Use: Former  Substance Use Topics   Alcohol use: No   Drug use: No     Allergies   Other, Flagyl [metronidazole], and Tinidazole   Review of Systems Review of Systems  All other systems reviewed and are  negative.   Physical Exam Triage Vital Signs ED Triage Vitals  Enc Vitals Group     BP 10/12/20 1753 133/83     Pulse Rate 10/12/20 1753 95     Resp 10/12/20 1753 16     Temp 10/12/20 1753 98.8 F (37.1 C)     Temp Source 10/12/20 1753 Oral     SpO2 10/12/20 1753 97 %     Weight --      Height --      Head Circumference --      Peak Flow --      Pain Score 10/12/20 1755 8     Pain Loc --      Pain Edu? --      Excl. in GC? --    No data found.  Updated Vital Signs BP 133/83 (BP Location: Right Arm)   Pulse 95   Temp 98.8 F (37.1 C) (Oral)   Resp 16   SpO2 97%   Visual Acuity Right Eye Distance:   Left Eye Distance:   Bilateral Distance:    Right Eye Near:   Left Eye Near:    Bilateral Near:     Physical Exam Vitals reviewed.  Constitutional:      Appearance: Normal appearance.  Musculoskeletal:        General: Swelling present. No tenderness.     Comments: Tender left arm, pain with movement nv and ns intact   Skin:    General: Skin is warm.  Neurological:     General: No focal deficit present.     Mental Status: She is alert.  Psychiatric:        Mood and Affect: Mood normal.     UC Treatments / Results  Labs (all labs ordered are listed, but only abnormal results are displayed) Labs Reviewed - No data to display  EKG   Radiology DG Humerus Left  Result Date: 10/12/2020 CLINICAL DATA:  Fall in shower, left arm pain/numbness EXAM: LEFT HUMERUS - 2+ VIEW COMPARISON:  None. FINDINGS: No fracture or dislocation is seen. The joint spaces are preserved. Radiopaque device/implant along the medial left upper arm. Visualized left lung is clear. IMPRESSION: Negative. Electronically Signed   By: Charline Bills M.D.   On: 10/12/2020 19:06    Procedures Procedures (including critical care time)  Medications Ordered in UC Medications  ibuprofen (ADVIL) tablet 800 mg (800 mg Oral Given 10/12/20 1827)    Initial Impression / Assessment and Plan / UC  Course  I have reviewed the triage vital signs and the nursing notes.  Pertinent labs & imaging results that were available during my care of the patient were reviewed by me and considered in my medical decision making (see chart for details).     MDM:  xray no fracutre.  Pt given ibuprofen here. Rx for ibuprofen  Final Clinical Impressions(s) / UC Diagnoses   Final diagnoses:  Contusion of left upper extremity, initial encounter     Discharge Instructions      Return if any problems.  See your Physicain for recheck in 1 week    ED Prescriptions     Medication Sig Dispense Auth. Provider   ibuprofen (ADVIL) 800 MG tablet Take 1 tablet (800 mg total) by mouth every 8 (eight) hours as needed. 30 tablet Elson Areas, [place]      PDMP not reviewed this encounter. An After Visit Summary was printed and given to the patient.    Elson Areas, [place] 10/12/20 1920

## 2020-10-12 NOTE — ED Triage Notes (Signed)
Fell in shower this morning and fell on left arm.  States hand feel numb.

## 2020-10-12 NOTE — Discharge Instructions (Addendum)
Return if any problems.  See your Physicain for recheck in 1 week  

## 2020-10-21 ENCOUNTER — Ambulatory Visit (HOSPITAL_COMMUNITY): Payer: Medicaid Other

## 2020-10-25 ENCOUNTER — Telehealth: Payer: Self-pay | Admitting: Internal Medicine

## 2020-10-25 NOTE — Telephone Encounter (Signed)
Patient called and said that they would not let her reschedule her gastric emptying.  Please advise. She was a no show

## 2020-10-25 NOTE — Telephone Encounter (Signed)
Called pt, states she cancelled GES because she got called into work. Nuc med wouldn't allow her to reschedule.  Called nuc med, spoke to Shelley, pt has been on schedule x 4 since April. The last appt she called before appt and cancelled. Baxter Hire, please advise.

## 2020-10-25 NOTE — Telephone Encounter (Signed)
Routing to Kristen 

## 2020-10-25 NOTE — Telephone Encounter (Signed)
Would verify with patient a time and date she can commit to for her GES. She needs to ensure making it to this appointment is a priority for her. If she can't make that commitment, recommend holding off on scheduling until she knows a time she will be able to make it.

## 2020-10-26 NOTE — Telephone Encounter (Signed)
Tried to call pt, LMOVM for return call. 

## 2020-10-27 ENCOUNTER — Telehealth: Payer: Self-pay | Admitting: Internal Medicine

## 2020-10-27 NOTE — Telephone Encounter (Signed)
Patient returned call please call back

## 2020-10-28 NOTE — Telephone Encounter (Signed)
Pt called office, informed her of Kristen's advice. States she will be available 11/19/20 for GES appt.  GES scheduled for 11/19/20 at 8:00am, arrive at 7:45am. NPO after midnight prior to test. No stomach medication the morning of test.  Called pt back and informed her of GES appt. Appt letter mailed.

## 2020-10-28 NOTE — Telephone Encounter (Signed)
Letter mailed

## 2020-11-03 ENCOUNTER — Encounter: Payer: Self-pay | Admitting: Adult Health

## 2020-11-03 ENCOUNTER — Other Ambulatory Visit (HOSPITAL_COMMUNITY)
Admission: RE | Admit: 2020-11-03 | Discharge: 2020-11-03 | Disposition: A | Discharge status: Home / Self Care | Payer: Medicaid Other | Source: Ambulatory Visit | Attending: Adult Health | Admitting: Adult Health

## 2020-11-03 ENCOUNTER — Ambulatory Visit (INDEPENDENT_AMBULATORY_CARE_PROVIDER_SITE_OTHER): Payer: Medicaid Other | Admitting: Adult Health

## 2020-11-03 ENCOUNTER — Other Ambulatory Visit: Payer: Self-pay

## 2020-11-03 VITALS — BP 116/78 | HR 84 | Ht 61.5 in | Wt 194.5 lb

## 2020-11-03 DIAGNOSIS — N644 Mastodynia: Secondary | ICD-10-CM

## 2020-11-03 DIAGNOSIS — Z01419 Encounter for gynecological examination (general) (routine) without abnormal findings: Secondary | ICD-10-CM | POA: Diagnosis not present

## 2020-11-03 DIAGNOSIS — Z113 Encounter for screening for infections with a predominantly sexual mode of transmission: Secondary | ICD-10-CM

## 2020-11-03 DIAGNOSIS — Z803 Family history of malignant neoplasm of breast: Secondary | ICD-10-CM | POA: Diagnosis not present

## 2020-11-03 DIAGNOSIS — Z975 Presence of (intrauterine) contraceptive device: Secondary | ICD-10-CM | POA: Insufficient documentation

## 2020-11-03 NOTE — Progress Notes (Signed)
Patient ID: Kimberly Norris, female   DOB: Dec 21, 1995, 25 y.o.   MRN: [id number] History of Present Illness: Kimberly Norris is a 25 year old white female,single, G0P0, in for a well woman gyn exam. She is having nipple pain for the last few days. Her mom had breast cancer at age 36. PCP is Coral Ceo NP.  Lab Results  Component Value Date   DIAGPAP  07/25/2018    NEGATIVE FOR INTRAEPITHELIAL LESIONS OR MALIGNANCY.    Current Medications, Allergies, Past Medical History, Past Surgical History, Family History and Social History were reviewed in [employer] record.     Review of Systems: Patient denies any headaches, hearing loss, fatigue, blurred vision, shortness of breath, chest pain, abdominal pain, problems with bowel movements, urination, or intercourse. No joint pain or mood swings.  +nipple pain   Physical Exam:BP 116/78 (BP Location: Left Arm, Patient Position: Sitting, Cuff Size: Normal)   Pulse 84   Ht 5' 1.5" (1.562 m)   Wt 194 lb 8 oz (88.2 kg)   BMI 36.16 kg/m   General:  Well developed, well nourished, no acute distress Skin:  Warm and dry Neck:  Midline trachea, normal thyroid, good ROM, no lymphadenopathy Lungs; Clear to auscultation bilaterally Breast:  No dominant palpable mass, retraction, or nipple discharge Cardiovascular: Regular rate and rhythm Abdomen:  Soft, non tender, no hepatosplenomegaly Pelvic:  External genitalia is normal in appearance, no lesions.  The vagina is normal in appearance. Urethra has no lesions or masses. The cervix is smooth, CV swab obtained. Uterus is felt to be normal size, shape, and contour.  No adnexal masses or tenderness noted.Bladder is non tender, no masses felt. Extremities/musculoskeletal:  No swelling or varicosities noted, no clubbing or cyanosis Psych:  No mood changes, alert and cooperative,seems happy AA is 1 Fall risk is moderate Depression screen [facility] 2/9 11/03/2020 07/25/2018 07/27/2016  Decreased  Interest 0 0 3  Down, Depressed, Hopeless 0 0 2  PHQ - 2 Score 0 0 5  Altered sleeping 1 1 3   Tired, decreased energy 1 1 3   Change in appetite 3 0 2  Feeling bad or failure about yourself  1 0 1  Trouble concentrating 0 0 2  Moving slowly or fidgety/restless 0 0 0  Suicidal thoughts 0 0 0  PHQ-9 Score 6 2 16   Difficult doing work/chores - Not difficult at all -    She declines meds, just stress at work GAD 7 : Generalized Anxiety Score 11/03/2020  Nervous, Anxious, on Edge 1  Control/stop worrying 1  Worry too much - different things 1  Trouble relaxing 0  Restless 0  Easily annoyed or irritable 1  Afraid - awful might happen 1  Total GAD 7 Score 5      Upstream - 11/03/20 1153       Pregnancy Intention Screening   Does the patient want to become pregnant in the next year? No    Does the patient's partner want to become pregnant in the next year? No    Would the patient like to discuss contraceptive options today? No      Contraception Wrap Up   Current Method Hormonal Implant    End Method Hormonal Implant    Contraception Counseling Provided No            Examination chaperoned by LPN  Impression and Plan: 1. Encounter for well woman exam with routine gynecological exam Pap and physical in 1 year  2. Nexplanon in place Placed 03/13/19 in left arm  3. Nipple pain Scheduled bilateral US of breast at [facility] 11/23/20 at 11:40 am  - US BREAST LTD UNI RIGHT INC AXILLA; Future - US BREAST LTD UNI LEFT INC AXILLA; Future  4. Family history of breast cancer in mother - US BREAST LTD UNI RIGHT INC AXILLA; Future - US BREAST LTD UNI LEFT INC AXILLA; Future   5 STD screening  CV swab sent for GC/CHL.trich and [employer]

## 2020-11-05 LAB — CERVICOVAGINAL ANCILLARY ONLY
Bacterial Vaginitis (gardnerella): NEGATIVE
Candida Glabrata: NEGATIVE
Candida Vaginitis: NEGATIVE
Chlamydia: NEGATIVE
Comment: NEGATIVE
Comment: NEGATIVE
Comment: NEGATIVE
Comment: NEGATIVE
Comment: NEGATIVE
Comment: NORMAL
Neisseria Gonorrhea: NEGATIVE
Trichomonas: NEGATIVE

## 2020-11-16 ENCOUNTER — Ambulatory Visit: Payer: Medicaid Other | Admitting: General Surgery

## 2020-11-16 ENCOUNTER — Other Ambulatory Visit: Payer: Self-pay

## 2020-11-16 ENCOUNTER — Encounter: Payer: Self-pay | Admitting: General Surgery

## 2020-11-16 VITALS — BP 114/79 | HR 80 | Temp 98.3°F | Resp 14 | Ht 61.5 in | Wt 193.0 lb

## 2020-11-16 DIAGNOSIS — K644 Residual hemorrhoidal skin tags: Secondary | ICD-10-CM | POA: Diagnosis not present

## 2020-11-16 DIAGNOSIS — K602 Anal fissure, unspecified: Secondary | ICD-10-CM

## 2020-11-16 DIAGNOSIS — K642 Third degree hemorrhoids: Secondary | ICD-10-CM

## 2020-11-16 NOTE — H&P (Signed)
[facility] History and Physical  Chief Complaint   Follow-up     Kimberly Norris is a 25 y.o. female.  HPI: Kimberly Norris is a 25 yo I saw back in February who had a anterior skin tag and a posterior small healing fissure. She had complaints of pain with defecation and I prescribed the diltiazem/ lidocaine ointment. Unfortunately she was not able to afford this but says that she has gotten her stools more regular, soft and that she takes the fiber and drinks more water. She says her biggest issue now is bleeding and some pain at the skin tag area.  She is coming back to see what is going on and to see if there are other options. She is now working and can afford the ointments if needed.  She says when she has a BM she has BRBPR.  Past Medical History:  Diagnosis Date   Bilateral chronic knee pain 04/16/2012   Heartburn    Liver disease    fatty liver   Mental disorder    anxiety, depression   Rhabdomyolysis     Past Surgical History:  Procedure Laterality Date   BIOPSY  09/11/2019   Procedure: BIOPSY;  Surgeon: Lanelle Bal, DO;  [facility];  Service: Endoscopy;;   ESOPHAGOGASTRODUODENOSCOPY (EGD) WITH PROPOFOL N/A 09/11/2019   Procedure: ESOPHAGOGASTRODUODENOSCOPY (EGD) WITH PROPOFOL;  Surgeon: Lanelle Bal, DO;  Esophageal mucosal changes suspicious for eosinophilic esophagitis s/p biopsy, gastritis s/p biopsy, normal examined duodenum biopsy.  All pathology was benign.     Family History  Problem Relation Age of Onset   Breast cancer Mother 78   Autism Sister        1 sister    Social History   Tobacco Use   Smoking status: Every Day    Packs/day: 0.50    Years: 5.00    Pack years: 2.50    Types: Cigarettes   Smokeless tobacco: Never   Tobacco comments:    smokes 1-2 cig daily  Vaping Use   Vaping Use: Former  Substance Use Topics   Alcohol use: Yes    Comment: rarely   Drug use: No    Medications: I have reviewed the  patient's current medications. Allergies as of 11/16/2020       Reactions   Other Anaphylaxis   Lilly flowers   Flagyl [metronidazole] Rash   Tinidazole Rash        Medication List        Accurate as of November 16, 2020  9:45 AM. If you have any questions, ask your nurse or doctor.          acetaminophen 500 MG tablet Commonly known as: TYLENOL Take 500 mg by mouth every 6 (six) hours as needed.   calcium carbonate 500 MG chewable tablet Commonly known as: TUMS - dosed in mg elemental calcium Chew 1 tablet by mouth daily. As needed   etonogestrel 68 MG Impl implant Commonly known as: NEXPLANON Inject 68 mg into the skin once.   pantoprazole 40 MG tablet Commonly known as: PROTONIX Take 1 tablet (40 mg total) by mouth daily before breakfast.         ROS:  A comprehensive review of systems was negative except for: Gastrointestinal: positive for rectal bleeding and pain  Blood pressure 114/79, pulse 80, temperature 98.3 F (36.8 C), temperature source Other (Comment), resp. rate 14, height 5' 1.5" (1.562 m), weight 193 lb (87.5 kg), SpO2 97 %.  Physical Exam Vitals reviewed.  Constitutional:      Appearance: Normal appearance.  HENT:     Head: Normocephalic.     Nose: Nose normal.     Mouth/Throat:     Mouth: Mucous membranes are moist.  Eyes:     Extraocular Movements: Extraocular movements intact.  Cardiovascular:     Rate and Rhythm: Normal rate and regular rhythm.  Pulmonary:     Effort: Pulmonary effort is normal.     Breath sounds: Normal breath sounds.  Abdominal:     General: There is no distension.     Palpations: Abdomen is soft.     Tenderness: There is no abdominal tenderness.  Genitourinary:    Comments: Almost healed posterior anal fissure, no skin tag, anterior skin tag now appears more associated with right anterior hemorrhoidal tissue, more prominent on valsalva  Musculoskeletal:        General: Normal range of motion.     Cervical  back: Normal range of motion.  Skin:    General: Skin is warm.  Neurological:     General: No focal deficit present.     Mental Status: She is alert and oriented to person, place, and time.  Psychiatric:        Mood and Affect: Mood normal.        Behavior: Behavior normal.        Thought Content: Thought content normal.    Results: None    Assessment & Plan:  Kimberly Norris is a 25 y.o. female with a grade III hemorrhoid with external tag on the anterior right location and healing/ almost healed posterior fissure. Discussed option of exam under anesthesia and removing the hemorrhoidal tissue that is causing the issues anteriorly.   Discussed using the diltiazem/ lidocaine ointment QID for any fissure symptoms now or in the future and keeping the stools soft and regular  Hemorrhoid surgery for external hemorrhoids is very painful. The pain and discomfort that the patient is having currently will be magnified after the surgery for at least 2-3 weeks.  The patient will have feelings of constant pressure and pain in the area from the swelling and removal of the anoderm (skin around the anus). The internal hemorrhoids are not painful to remove because the same nerves are not involved, and the sensation is different, but removal of any external hemorrhoids will cause significant discomfort. They will need at least 4-6 weeks to recover from the surgery, and should not expect to be able to feel back to "normal for 6-8 weeks."    The risk of hemorrhoid surgery include bleeding, risk of infection although rare, and the risk of narrowing the anal canal if too much tissue is removed. Given this risk, it is likely that only the 2 largest hemorrhoid columns would be removed during the initial surgery.  We have also discussed the risk of incontinence after surgery if the muscles were injured, and although this is rare that it can happen and is another reason to limit the amount of hemorrhoids removed.     All questions were answered to the satisfaction of the patient.     Virl Cagey

## 2020-11-16 NOTE — Progress Notes (Signed)
[facility] History and Physical  Chief Complaint   Follow-up     Kimberly Norris is a 25 y.o. female.  HPI: Kimberly Norris is a 25 yo I saw back in February who had a anterior skin tag and a posterior small healing fissure. She had complaints of pain with defecation and I prescribed the diltiazem/ lidocaine ointment. Unfortunately she was not able to afford this but says that she has gotten her stools more regular, soft and that she takes the fiber and drinks more water. She says her biggest issue now is bleeding and some pain at the skin tag area.  She is coming back to see what is going on and to see if there are other options. She is now working and can afford the ointments if needed.  She says when she has a BM she has BRBPR.  Past Medical History:  Diagnosis Date   Bilateral chronic knee pain 04/16/2012   Heartburn    Liver disease    fatty liver   Mental disorder    anxiety, depression   Rhabdomyolysis     Past Surgical History:  Procedure Laterality Date   BIOPSY  09/11/2019   Procedure: BIOPSY;  Surgeon: Lanelle Bal, DO;  [facility];  Service: Endoscopy;;   ESOPHAGOGASTRODUODENOSCOPY (EGD) WITH PROPOFOL N/A 09/11/2019   Procedure: ESOPHAGOGASTRODUODENOSCOPY (EGD) WITH PROPOFOL;  Surgeon: Lanelle Bal, DO;  Esophageal mucosal changes suspicious for eosinophilic esophagitis s/p biopsy, gastritis s/p biopsy, normal examined duodenum biopsy.  All pathology was benign.     Family History  Problem Relation Age of Onset   Breast cancer Mother 78   Autism Sister        1 sister    Social History   Tobacco Use   Smoking status: Every Day    Packs/day: 0.50    Years: 5.00    Pack years: 2.50    Types: Cigarettes   Smokeless tobacco: Never   Tobacco comments:    smokes 1-2 cig daily  Vaping Use   Vaping Use: Former  Substance Use Topics   Alcohol use: Yes    Comment: rarely   Drug use: No    Medications: I have reviewed the  patient's current medications. Allergies as of 11/16/2020       Reactions   Other Anaphylaxis   Lilly flowers   Flagyl [metronidazole] Rash   Tinidazole Rash        Medication List        Accurate as of November 16, 2020  9:45 AM. If you have any questions, ask your nurse or doctor.          acetaminophen 500 MG tablet Commonly known as: TYLENOL Take 500 mg by mouth every 6 (six) hours as needed.   calcium carbonate 500 MG chewable tablet Commonly known as: TUMS - dosed in mg elemental calcium Chew 1 tablet by mouth daily. As needed   etonogestrel 68 MG Impl implant Commonly known as: NEXPLANON Inject 68 mg into the skin once.   pantoprazole 40 MG tablet Commonly known as: PROTONIX Take 1 tablet (40 mg total) by mouth daily before breakfast.         ROS:  A comprehensive review of systems was negative except for: Gastrointestinal: positive for rectal bleeding and pain  Blood pressure 114/79, pulse 80, temperature 98.3 F (36.8 C), temperature source Other (Comment), resp. rate 14, height 5' 1.5" (1.562 m), weight 193 lb (87.5 kg), SpO2 97 %.  Physical Exam Vitals reviewed.  Constitutional:      Appearance: Normal appearance.  HENT:     Head: Normocephalic.     Nose: Nose normal.     Mouth/Throat:     Mouth: Mucous membranes are moist.  Eyes:     Extraocular Movements: Extraocular movements intact.  Cardiovascular:     Rate and Rhythm: Normal rate and regular rhythm.  Pulmonary:     Effort: Pulmonary effort is normal.     Breath sounds: Normal breath sounds.  Abdominal:     General: There is no distension.     Palpations: Abdomen is soft.     Tenderness: There is no abdominal tenderness.  Genitourinary:    Comments: Almost healed posterior anal fissure, no skin tag, anterior skin tag now appears more associated with right anterior hemorrhoidal tissue, more prominent on valsalva  Musculoskeletal:        General: Normal range of motion.     Cervical  back: Normal range of motion.  Skin:    General: Skin is warm.  Neurological:     General: No focal deficit present.     Mental Status: She is alert and oriented to person, place, and time.  Psychiatric:        Mood and Affect: Mood normal.        Behavior: Behavior normal.        Thought Content: Thought content normal.    Results: None    Assessment & Plan:  Kimberly Norris is a 25 y.o. female with a grade III hemorrhoid with external tag on the anterior right location and healing/ almost healed posterior fissure. Discussed option of exam under anesthesia and removing the hemorrhoidal tissue that is causing the issues anteriorly.   Discussed using the diltiazem/ lidocaine ointment QID for any fissure symptoms now or in the future and keeping the stools soft and regular  Hemorrhoid surgery for external hemorrhoids is very painful. The pain and discomfort that the patient is having currently will be magnified after the surgery for at least 2-3 weeks.  The patient will have feelings of constant pressure and pain in the area from the swelling and removal of the anoderm (skin around the anus). The internal hemorrhoids are not painful to remove because the same nerves are not involved, and the sensation is different, but removal of any external hemorrhoids will cause significant discomfort. They will need at least 4-6 weeks to recover from the surgery, and should not expect to be able to feel back to "normal for 6-8 weeks."    The risk of hemorrhoid surgery include bleeding, risk of infection although rare, and the risk of narrowing the anal canal if too much tissue is removed. Given this risk, it is likely that only the 2 largest hemorrhoid columns would be removed during the initial surgery.  We have also discussed the risk of incontinence after surgery if the muscles were injured, and although this is rare that it can happen and is another reason to limit the amount of hemorrhoids removed.     All questions were answered to the satisfaction of the patient.     [name] 11/16/2020, 9:45 AM

## 2020-11-16 NOTE — Patient Instructions (Addendum)
[facility] for Fissure- call them next week when you want to get it filled and picked up.   Surgical Procedures for Hemorrhoids Surgical procedures can be used to treat hemorrhoids. Hemorrhoids are swollen veins that are inside the rectum (internal hemorrhoids) or around the anus (external hemorrhoids). They are caused by increased pressure in the anal area. This pressure may result from straining to have a bowel movement (constipation), diarrhea, pregnancy, obesity, or sitting for long periods of time. Hemorrhoids can cause symptoms such as pain and bleeding. Surgery may be needed if diet changes, lifestyle changes, and other treatments do not help your symptoms. Common surgical methods that may be used include: Closed hemorrhoidectomy. The hemorrhoids are surgically removed, and the incisions are closed with stitches (sutures). Open hemorrhoidectomy. The hemorrhoids are surgically removed, but the incisions are allowed to heal without sutures. Stapled hemorrhoidectomy. The hemorrhoids are partially removed, and the incisions are closed with staples. Tell a health care provider about: Any allergies you have. All medicines you are taking, including vitamins, herbs, eye drops, creams, and over-the-counter medicines. Any problems you or family members have had with anesthetic medicines. Any blood disorders you have. Any surgeries you have had. Any medical conditions you have. Whether you are pregnant or may be pregnant. What are the risks? Generally, this is a safe procedure. However, problems may occur, including: Infection. Bleeding. Allergic reactions to medicines. Damage to other structures or organs. Pain. Constipation. Difficulty passing urine. Narrowing of the anal canal (stenosis). Difficulty controlling bowel movements (incontinence). Recurring hemorrhoids. A new passage (fistula) that forms between the anus or rectum and another area. What happens before the  procedure? Medicines Ask your health care provider about: Changing or stopping your regular medicines. This is especially important if you are taking diabetes medicines or blood thinners. Taking medicines such as aspirin and ibuprofen. These medicines can thin your blood. Do not take these medicines unless your health care provider tells you to take them. Taking over-the-counter medicines, vitamins, herbs, and supplements.  General instructions You may need to have a procedure to examine the inside of your colon with a scope (colonoscopy). Your health care provider may do this to make sure that there are no other causes for your bleeding or pain. You may be instructed to take a laxative and an enema to clean out your colon before surgery (bowel prep). Carefully follow instructions from your health care provider about bowel prep. Plan to have someone take you home from the hospital or clinic. Plan to have a responsible adult care for you for at least 24 hours after you leave the hospital or clinic. This is important. Ask your health care provider: How your surgery site will be marked. What steps will be taken to help prevent infection. These may include: Washing skin with a germ-killing soap. Taking antibiotic medicine. What happens during the procedure? An IV will be inserted into one of your veins. You will be given one or more of the following: A medicine to help you relax (sedative). A medicine to numb the area (local anesthetic). A medicine to make you fall asleep (general anesthetic). A medicine that is injected into an area of your body to numb everything below the injection site (regional anesthetic). A lubricating jelly may be placed into your rectum. Your surgeon will insert a short scope (anoscope) into your rectum to examine the hemorrhoids. One of the following surgical methods will be used to remove the hemorrhoids: Closed hemorrhoidectomy. Your surgeon will use surgical  instruments to open the tissue around the hemorrhoids. The veins that supply the hemorrhoids will be tied off with a suture. The hemorrhoids will be removed. The tissue that surrounds the hemorrhoids will be closed with sutures that your body can absorb (absorbable sutures). Open hemorrhoidectomy. The hemorrhoids will be removed with surgical instruments. The incisions will be left open to heal without sutures. Stapled hemorrhoidectomy. Your surgeon will use a circular stapling device to partially remove the hemorrhoids. The device will be inserted into your anus. It will remove a circular ring of tissue that includes hemorrhoid tissue and some tissue above the hemorrhoids. The staples in the device will close the edges of the tissue. This will cut off the blood supply to any remaining hemorrhoids and pull the tissue back into place. Each of these procedures may vary among health care providers and hospitals. What happens after the procedure? Your blood pressure, heart rate, breathing rate, and blood oxygen level may be monitored until you leave the hospital or clinic. You will be given pain medicine as needed. Do not drive for 24 hours if you were given a sedative during your procedure. Summary Surgery may be needed for hemorrhoids if diet changes, lifestyle changes, and other treatments do not help your symptoms. There are three common methods of surgery that are used to treat hemorrhoids. Follow instructions from your health care provider about taking medicines and about eating and drinking before the procedure. You may be instructed to take a laxative and an enema to clean out your colon before surgery (bowel prep). This information is not intended to replace advice given to you by your health care provider. Make sure you discuss any questions you have with your health care provider. Document Revised: 06/19/2018 Document Reviewed: 11/20/2017 Elsevier Patient Education  2022 [employer].

## 2020-11-19 ENCOUNTER — Other Ambulatory Visit: Payer: Self-pay

## 2020-11-19 ENCOUNTER — Ambulatory Visit (HOSPITAL_COMMUNITY)
Admission: RE | Admit: 2020-11-19 | Discharge: 2020-11-19 | Disposition: A | Discharge status: Home / Self Care | Payer: Medicaid Other | Source: Ambulatory Visit | Attending: Gastroenterology | Admitting: Gastroenterology

## 2020-11-19 DIAGNOSIS — R1011 Right upper quadrant pain: Secondary | ICD-10-CM | POA: Insufficient documentation

## 2020-11-19 DIAGNOSIS — R110 Nausea: Secondary | ICD-10-CM | POA: Diagnosis present

## 2020-11-19 DIAGNOSIS — R6881 Early satiety: Secondary | ICD-10-CM | POA: Diagnosis not present

## 2020-11-19 MED ORDER — TECHNETIUM TC 99M SULFUR COLLOID
2.0000 | Freq: Once | INTRAVENOUS | Status: AC | PRN
  Administered 2020-11-19: 2.1 via ORAL

## 2020-11-23 ENCOUNTER — Ambulatory Visit (HOSPITAL_COMMUNITY)
Admission: RE | Admit: 2020-11-23 | Discharge: 2020-11-23 | Disposition: A | Discharge status: Home / Self Care | Payer: Medicaid Other | Source: Ambulatory Visit | Attending: Adult Health | Admitting: Adult Health

## 2020-11-23 ENCOUNTER — Other Ambulatory Visit: Payer: Self-pay

## 2020-11-23 DIAGNOSIS — Z803 Family history of malignant neoplasm of breast: Secondary | ICD-10-CM | POA: Insufficient documentation

## 2020-11-23 DIAGNOSIS — N644 Mastodynia: Secondary | ICD-10-CM | POA: Insufficient documentation

## 2020-11-25 NOTE — Patient Instructions (Signed)
Your procedure is scheduled on: 12/01/2020  Report to [facility] Main Entrance at  7:30   AM.  Call this number if you have problems the morning of surgery: [phone]   Remember:   Do not Eat or Drink after midnight         No Smoking the morning of surgery  :  Take these medicines the morning of surgery with A SIP OF WATER: Pantoprazole   Do not wear jewelry, make-up or nail polish.  Do not wear lotions, powders, or perfumes. You may wear deodorant.  Do not shave 48 hours prior to surgery. Men may shave face and neck.  Do not bring valuables to the hospital.  Contacts, dentures or bridgework may not be worn into surgery.  Leave suitcase in the car. After surgery it may be brought to your room.  For patients admitted to the hospital, checkout time is 11:00 AM the day of discharge.   Patients discharged the day of surgery will not be allowed to drive home.    Special Instructions: Shower using CHG night before surgery and shower the day of surgery use CHG.  Use special wash - you have one bottle of CHG for all showers.  You should use approximately 1/2 of the bottle for each shower.  How to Use Chlorhexidine for Bathing Chlorhexidine gluconate (CHG) is a germ-killing (antiseptic) solution that is used to clean the skin. It can get rid of the bacteria that normally live on the skin and can keep them away for about 24 hours. To clean your skin with CHG, you may be given: A CHG solution to use in the shower or as part of a sponge bath. A prepackaged cloth that contains CHG. Cleaning your skin with CHG may help lower the risk for infection: While you are staying in the intensive care unit of the hospital. If you have a vascular access, such as a central line, to provide short-term or long-term access to your veins. If you have a catheter to drain urine from your bladder. If you are on a ventilator. A ventilator is a machine that helps you breathe by moving air in and out of your  lungs. After surgery. What are the risks? Risks of using CHG include: A skin reaction. Hearing loss, if CHG gets in your ears and you have a perforated eardrum. Eye injury, if CHG gets in your eyes and is not rinsed out. The CHG product catching fire. Make sure that you avoid smoking and flames after applying CHG to your skin. Do not use CHG: If you have a chlorhexidine allergy or have previously reacted to chlorhexidine. On babies younger than 36 months of age. How to use CHG solution Use CHG only as told by your health care provider, and follow the instructions on the label. Use the full amount of CHG as directed. Usually, this is one bottle. During a shower Follow these steps when using CHG solution during a shower (unless your health care provider gives you different instructions): Start the shower. Use your normal soap and shampoo to wash your face and hair. Turn off the shower or move out of the shower stream. Pour the CHG onto a clean washcloth. Do not use any type of brush or rough-edged sponge. Starting at your neck, lather your body down to your toes. Make sure you follow these instructions: If you will be having surgery, pay special attention to the part of your body where you will be having surgery. Scrub  this area for at least 1 minute. Do not use CHG on your head or face. If the solution gets into your ears or eyes, rinse them well with water. Avoid your genital area. Avoid any areas of skin that have broken skin, cuts, or scrapes. Scrub your back and under your arms. Make sure to wash skin folds. Let the lather sit on your skin for 1-2 minutes or as long as told by your health care provider. Thoroughly rinse your entire body in the shower. Make sure that all body creases and crevices are rinsed well. Dry off with a clean towel. Do not put any substances on your body afterward--such as powder, lotion, or perfume--unless you are told to do so by your health care provider.  Only use lotions that are recommended by the manufacturer. Put on clean clothes or pajamas. If it is the night before your surgery, sleep in clean sheets.  During a sponge bath Follow these steps when using CHG solution during a sponge bath (unless your health care provider gives you different instructions): Use your normal soap and shampoo to wash your face and hair. Pour the CHG onto a clean washcloth. Starting at your neck, lather your body down to your toes. Make sure you follow these instructions: If you will be having surgery, pay special attention to the part of your body where you will be having surgery. Scrub this area for at least 1 minute. Do not use CHG on your head or face. If the solution gets into your ears or eyes, rinse them well with water. Avoid your genital area. Avoid any areas of skin that have broken skin, cuts, or scrapes. Scrub your back and under your arms. Make sure to wash skin folds. Let the lather sit on your skin for 1-2 minutes or as long as told by your health care provider. Using a different clean, wet washcloth, thoroughly rinse your entire body. Make sure that all body creases and crevices are rinsed well. Dry off with a clean towel. Do not put any substances on your body afterward--such as powder, lotion, or perfume--unless you are told to do so by your health care provider. Only use lotions that are recommended by the manufacturer. Put on clean clothes or pajamas. If it is the night before your surgery, sleep in clean sheets. How to use CHG prepackaged cloths Only use CHG cloths as told by your health care provider, and follow the instructions on the label. Use the CHG cloth on clean, dry skin. Do not use the CHG cloth on your head or face unless your health care provider tells you to. When washing with the CHG cloth: Avoid your genital area. Avoid any areas of skin that have broken skin, cuts, or scrapes. Before surgery Follow these steps when using a  CHG cloth to clean before surgery (unless your health care provider gives you different instructions): Using the CHG cloth, vigorously scrub the part of your body where you will be having surgery. Scrub using a back-and-forth motion for 3 minutes. The area on your body should be completely wet with CHG when you are done scrubbing. Do not rinse. Discard the cloth and let the area air-dry. Do not put any substances on the area afterward, such as powder, lotion, or perfume. Put on clean clothes or pajamas. If it is the night before your surgery, sleep in clean sheets.  For general bathing Follow these steps when using CHG cloths for general bathing (unless your health care provider  gives you different instructions). Use a separate CHG cloth for each area of your body. Make sure you wash between any folds of skin and between your fingers and toes. Wash your body in the following order, switching to a new cloth after each step: The front of your neck, shoulders, and chest. Both of your arms, under your arms, and your hands. Your stomach and groin area, avoiding the genitals. Your right leg and foot. Your left leg and foot. The back of your neck, your back, and your buttocks. Do not rinse. Discard the cloth and let the area air-dry. Do not put any substances on your body afterward--such as powder, lotion, or perfume--unless you are told to do so by your health care provider. Only use lotions that are recommended by the manufacturer. Put on clean clothes or pajamas. Contact a health care provider if: Your skin gets irritated after scrubbing. You have questions about using your solution or cloth. You swallow any chlorhexidine. Call your local poison control center ([phone] in the [place] Get help right away if: Your eyes itch badly, or they become very red or swollen. Your skin itches badly and is red or swollen. Your hearing changes. You have trouble seeing. You have swelling or tingling in  your mouth or throat. You have trouble breathing. These symptoms may represent a serious problem that is an emergency. Do not wait to see if the symptoms will go away. Get medical help right away. Call your local emergency services (911 in the [place] Do not drive yourself to the hospital. Summary Chlorhexidine gluconate (CHG) is a germ-killing (antiseptic) solution that is used to clean the skin. Cleaning your skin with CHG may help to lower your risk for infection. You may be given CHG to use for bathing. It may be in a bottle or in a prepackaged cloth to use on your skin. Carefully follow your health care provider's instructions and the instructions on the product label. Do not use CHG if you have a chlorhexidine allergy. Contact your health care provider if your skin gets irritated after scrubbing. This information is not intended to replace advice given to you by your health care provider. Make sure you discuss any questions you have with your health care provider. Document Revised: 03/15/2020 Document Reviewed: 03/15/2020 Elsevier Patient Education  2022 [employer]. Surgical Procedures for Hemorrhoids, Care After This sheet gives you information about how to care for yourself after your procedure. Your health care provider may also give you more specific instructions. If you have problems or questions, contact your health care provider. What can I expect after the procedure? After the procedure, it is common to have: Rectal pain. Pain when you are having a bowel movement. Slight rectal bleeding. This is more likely to happen with the first bowel movement after surgery. Follow these instructions at home: Medicines Take over-the-counter and prescription medicines only as told by your health care provider. If you were prescribed an antibiotic medicine, use it as told by your health care provider. Do not stop using the antibiotic even if your condition improves. Ask your health care  provider if the medicine prescribed to you requires you to avoid driving or using heavy machinery. Use a stool softener or a bulk laxative as told by your health care provider. Eating and drinking Follow instructions from your health care provider about what to eat or drink after your procedure. You may need to take actions to prevent or treat constipation, such as: Drink enough fluid to  keep your urine pale yellow. Take over-the-counter or prescription medicines. Eat foods that are high in fiber, such as beans, whole grains, and fresh fruits and vegetables. Limit foods that are high in fat and processed sugars, such as fried or sweet foods. Activity  Rest as told by your health care provider. Avoid sitting for a long time without moving. Get up to take short walks every 1-2 hours. This is important to improve blood flow and breathing. Ask for help if you feel weak or unsteady. Return to your normal activities as told by your health care provider. Ask your health care provider what activities are safe for you. Do not lift anything that is heavier than 10 lb (4.5 kg), or the limit that you are told, until your health care provider says that it is safe. Do not strain to have a bowel movement. Do not spend a long time sitting on the toilet. General instructions  Take warm sitz baths for 15-20 minutes, 2-3 times a day to relieve soreness or itching and to keep the rectal area clean. Apply ice packs to the area to reduce swelling and pain. Do not drive for 24 hours if you were given a sedative during your procedure. Keep all follow-up visits as told by your health care provider. This is important. Contact a health care provider if: Your pain medicine is not helping. You have a fever or chills. You have bad smelling drainage. You have a lot of swelling. You become constipated. You have trouble passing urine. Get help right away if: You have very bad rectal pain. You have heavy bleeding from  your rectum. Summary After the procedure, it is common to have pain and slight rectal bleeding. Take warm sitz baths for 15-20 minutes, 2-3 times a day to relieve soreness or itching and to keep the rectal area clean. Avoid straining when having a bowel movement. Eat foods that are high in fiber, such as beans, whole grains, and fresh fruits and vegetables. Take over-the-counter and prescription medicines only as told by your health care provider. This information is not intended to replace advice given to you by your health care provider. Make sure you discuss any questions you have with your health care provider. Document Revised: 07/14/2020 Document Reviewed: 07/14/2020 Elsevier Patient Education  2022 Elsevier Inc. General Anesthesia, Adult, Care After This sheet gives you information about how to care for yourself after your procedure. Your health care provider may also give you more specific instructions. If you have problems or questions, contact your health care provider. What can I expect after the procedure? After the procedure, the following side effects are common: Pain or discomfort at the IV site. Nausea. Vomiting. Sore throat. Trouble concentrating. Feeling cold or chills. Feeling weak or tired. Sleepiness and fatigue. Soreness and body aches. These side effects can affect parts of the body that were not involved in surgery. Follow these instructions at home: For the time period you were told by your health care provider:  Rest. Do not participate in activities where you could fall or become injured. Do not drive or use machinery. Do not drink alcohol. Do not take sleeping pills or medicines that cause drowsiness. Do not make important decisions or sign legal documents. Do not take care of children on your own. Eating and drinking Follow any instructions from your health care provider about eating or drinking restrictions. When you feel hungry, start by eating small  amounts of foods that are soft and easy to digest (  bland), such as toast. Gradually return to your regular diet. Drink enough fluid to keep your urine pale yellow. If you vomit, rehydrate by drinking water, juice, or clear broth. General instructions If you have sleep apnea, surgery and certain medicines can increase your risk for breathing problems. Follow instructions from your health care provider about wearing your sleep device: Anytime you are sleeping, including during daytime naps. While taking prescription pain medicines, sleeping medicines, or medicines that make you drowsy. Have a responsible adult stay with you for the time you are told. It is important to have someone help care for you until you are awake and alert. Return to your normal activities as told by your health care provider. Ask your health care provider what activities are safe for you. Take over-the-counter and prescription medicines only as told by your health care provider. If you smoke, do not smoke without supervision. Keep all follow-up visits as told by your health care provider. This is important. Contact a health care provider if: You have nausea or vomiting that does not get better with medicine. You cannot eat or drink without vomiting. You have pain that does not get better with medicine. You are unable to pass urine. You develop a skin rash. You have a fever. You have redness around your IV site that gets worse. Get help right away if: You have difficulty breathing. You have chest pain. You have blood in your urine or stool, or you vomit blood. Summary After the procedure, it is common to have a sore throat or nausea. It is also common to feel tired. Have a responsible adult stay with you for the time you are told. It is important to have someone help care for you until you are awake and alert. When you feel hungry, start by eating small amounts of foods that are soft and easy to digest (bland), such as  toast. Gradually return to your regular diet. Drink enough fluid to keep your urine pale yellow. Return to your normal activities as told by your health care provider. Ask your health care provider what activities are safe for you. This information is not intended to replace advice given to you by your health care provider. Make sure you discuss any questions you have with your health care provider. Document Revised: 09/18/2019 Document Reviewed: 04/17/2019 Elsevier Patient Education  2022 [employer].

## 2020-11-29 ENCOUNTER — Encounter: Payer: Self-pay | Admitting: Family Medicine

## 2020-11-29 ENCOUNTER — Other Ambulatory Visit: Payer: Self-pay

## 2020-11-29 ENCOUNTER — Encounter (HOSPITAL_COMMUNITY)
Admission: RE | Admit: 2020-11-29 | Discharge: 2020-11-29 | Disposition: A | Discharge status: Home / Self Care | Payer: Medicaid Other | Source: Ambulatory Visit | Attending: General Surgery | Admitting: General Surgery

## 2020-11-29 ENCOUNTER — Encounter (HOSPITAL_COMMUNITY): Payer: Self-pay

## 2020-11-29 VITALS — BP 109/72 | HR 87 | Temp 98.4°F | Resp 16 | Ht 61.05 in | Wt 193.0 lb

## 2020-11-29 DIAGNOSIS — Z01812 Encounter for preprocedural laboratory examination: Secondary | ICD-10-CM | POA: Diagnosis present

## 2020-11-29 DIAGNOSIS — Z01818 Encounter for other preprocedural examination: Secondary | ICD-10-CM

## 2020-11-29 HISTORY — DX: Gastro-esophageal reflux disease without esophagitis: K21.9

## 2020-11-29 LAB — PREGNANCY, URINE: Preg Test, Ur: NEGATIVE

## 2020-11-30 ENCOUNTER — Telehealth: Payer: Self-pay | Admitting: Adult Health

## 2020-11-30 NOTE — Telephone Encounter (Signed)
Left message to call me if wants to talk about genetic testing and counseling.

## 2020-12-01 ENCOUNTER — Encounter (HOSPITAL_COMMUNITY)
Admission: RE | Disposition: A | Discharge status: Home / Self Care | Payer: Self-pay | Source: Ambulatory Visit | Attending: General Surgery

## 2020-12-01 ENCOUNTER — Encounter (HOSPITAL_COMMUNITY): Payer: Self-pay | Admitting: General Surgery

## 2020-12-01 ENCOUNTER — Ambulatory Visit (HOSPITAL_COMMUNITY): Payer: Medicaid Other | Admitting: Certified Registered"

## 2020-12-01 ENCOUNTER — Ambulatory Visit (HOSPITAL_COMMUNITY)
Admission: RE | Admit: 2020-12-01 | Discharge: 2020-12-01 | Disposition: A | Discharge status: Home / Self Care | Payer: Medicaid Other | Source: Ambulatory Visit | Attending: General Surgery | Admitting: General Surgery

## 2020-12-01 DIAGNOSIS — K602 Anal fissure, unspecified: Secondary | ICD-10-CM | POA: Diagnosis present

## 2020-12-01 DIAGNOSIS — K644 Residual hemorrhoidal skin tags: Secondary | ICD-10-CM | POA: Diagnosis present

## 2020-12-01 DIAGNOSIS — K642 Third degree hemorrhoids: Secondary | ICD-10-CM

## 2020-12-01 DIAGNOSIS — F17210 Nicotine dependence, cigarettes, uncomplicated: Secondary | ICD-10-CM | POA: Insufficient documentation

## 2020-12-01 HISTORY — PX: HEMORRHOID SURGERY: SHX153

## 2020-12-01 SURGERY — HEMORRHOIDECTOMY
Anesthesia: General | Site: Rectum

## 2020-12-01 MED ORDER — LIDOCAINE VISCOUS HCL 2 % MT SOLN
OROMUCOSAL | Status: DC | PRN
  Administered 2020-12-01: 1

## 2020-12-01 MED ORDER — FENTANYL CITRATE PF 50 MCG/ML IJ SOSY: 25.0000 ug | PREFILLED_SYRINGE | INTRAMUSCULAR | Status: DC | PRN

## 2020-12-01 MED ORDER — BUPIVACAINE LIPOSOME 1.3 % IJ SUSP
INTRAMUSCULAR | Status: DC | PRN
  Administered 2020-12-01: 20 mL

## 2020-12-01 MED ORDER — CHLORHEXIDINE GLUCONATE CLOTH 2 % EX PADS: 6.0000 | MEDICATED_PAD | Freq: Once | CUTANEOUS | Status: DC

## 2020-12-01 MED ORDER — FENTANYL CITRATE (PF) 100 MCG/2ML IJ SOLN
INTRAMUSCULAR | Status: DC | PRN
  Administered 2020-12-01 (×4): 50 ug via INTRAVENOUS

## 2020-12-01 MED ORDER — LIDOCAINE VISCOUS HCL 2 % MT SOLN
OROMUCOSAL | Status: AC
  Filled 2020-12-01: qty 15

## 2020-12-01 MED ORDER — PROPOFOL 10 MG/ML IV BOLUS
INTRAVENOUS | Status: AC
  Filled 2020-12-01: qty 20

## 2020-12-01 MED ORDER — FENTANYL CITRATE (PF) 100 MCG/2ML IJ SOLN
INTRAMUSCULAR | Status: AC
  Filled 2020-12-01: qty 2

## 2020-12-01 MED ORDER — GLYCOPYRROLATE PF 0.2 MG/ML IJ SOSY
PREFILLED_SYRINGE | INTRAMUSCULAR | Status: AC
  Filled 2020-12-01: qty 1

## 2020-12-01 MED ORDER — LIDOCAINE 2% (20 MG/ML) 5 ML SYRINGE
INTRAMUSCULAR | Status: DC | PRN
  Administered 2020-12-01: 100 mg via INTRAVENOUS

## 2020-12-01 MED ORDER — ONDANSETRON HCL 4 MG/2ML IJ SOLN
INTRAMUSCULAR | Status: AC
  Filled 2020-12-01: qty 2

## 2020-12-01 MED ORDER — KETOROLAC TROMETHAMINE 30 MG/ML IJ SOLN
INTRAMUSCULAR | Status: AC
  Filled 2020-12-01: qty 1

## 2020-12-01 MED ORDER — GLYCOPYRROLATE PF 0.2 MG/ML IJ SOSY
PREFILLED_SYRINGE | INTRAMUSCULAR | Status: DC | PRN
  Administered 2020-12-01: .1 mg via INTRAVENOUS

## 2020-12-01 MED ORDER — MIDAZOLAM HCL 5 MG/5ML IJ SOLN
INTRAMUSCULAR | Status: DC | PRN
  Administered 2020-12-01: 2 mg via INTRAVENOUS

## 2020-12-01 MED ORDER — OXYCODONE HCL 5 MG PO TABS: 5.0000 mg | ORAL_TABLET | ORAL | 0 refills | Status: DC | PRN

## 2020-12-01 MED ORDER — DEXMEDETOMIDINE (PRECEDEX) IN NS 20 MCG/5ML (4 MCG/ML) IV SYRINGE
PREFILLED_SYRINGE | INTRAVENOUS | Status: DC | PRN
  Administered 2020-12-01 (×2): 10 ug via INTRAVENOUS

## 2020-12-01 MED ORDER — PROPOFOL 10 MG/ML IV BOLUS
INTRAVENOUS | Status: DC | PRN
  Administered 2020-12-01: 200 mg via INTRAVENOUS

## 2020-12-01 MED ORDER — KETOROLAC TROMETHAMINE 30 MG/ML IJ SOLN
INTRAMUSCULAR | Status: DC | PRN
  Administered 2020-12-01: 30 mg via INTRAVENOUS

## 2020-12-01 MED ORDER — MIDAZOLAM HCL 2 MG/2ML IJ SOLN
INTRAMUSCULAR | Status: AC
  Filled 2020-12-01: qty 2

## 2020-12-01 MED ORDER — LACTATED RINGERS IV SOLN: INTRAVENOUS | Status: DC

## 2020-12-01 MED ORDER — SUCCINYLCHOLINE CHLORIDE 200 MG/10ML IV SOSY
PREFILLED_SYRINGE | INTRAVENOUS | Status: DC | PRN
  Administered 2020-12-01: 100 mg via INTRAVENOUS

## 2020-12-01 MED ORDER — DEXAMETHASONE SODIUM PHOSPHATE 10 MG/ML IJ SOLN
INTRAMUSCULAR | Status: DC | PRN
  Administered 2020-12-01: 5 mg via INTRAVENOUS

## 2020-12-01 MED ORDER — ONDANSETRON HCL 4 MG/2ML IJ SOLN
INTRAMUSCULAR | Status: DC | PRN
  Administered 2020-12-01: 4 mg via INTRAVENOUS

## 2020-12-01 MED ORDER — LIDOCAINE HCL (PF) 2 % IJ SOLN
INTRAMUSCULAR | Status: AC
  Filled 2020-12-01: qty 5

## 2020-12-01 MED ORDER — ORAL CARE MOUTH RINSE: 15.0000 mL | Freq: Once | OROMUCOSAL | Status: AC

## 2020-12-01 MED ORDER — ONDANSETRON HCL 4 MG PO TABS: 4.0000 mg | ORAL_TABLET | Freq: Three times a day (TID) | ORAL | 1 refills | Status: DC | PRN

## 2020-12-01 MED ORDER — ONDANSETRON HCL 4 MG/2ML IJ SOLN: 4.0000 mg | Freq: Once | INTRAMUSCULAR | Status: DC | PRN

## 2020-12-01 MED ORDER — DEXMEDETOMIDINE (PRECEDEX) IN NS 20 MCG/5ML (4 MCG/ML) IV SYRINGE
PREFILLED_SYRINGE | INTRAVENOUS | Status: AC
  Filled 2020-12-01: qty 5

## 2020-12-01 MED ORDER — SODIUM CHLORIDE 0.9 % IR SOLN
Status: DC | PRN
  Administered 2020-12-01: 1000 mL

## 2020-12-01 MED ORDER — CHLORHEXIDINE GLUCONATE 0.12 % MT SOLN
15.0000 mL | Freq: Once | OROMUCOSAL | Status: AC
  Administered 2020-12-01: 15 mL via OROMUCOSAL

## 2020-12-01 MED ORDER — BUPIVACAINE LIPOSOME 1.3 % IJ SUSP
INTRAMUSCULAR | Status: AC
  Filled 2020-12-01: qty 20

## 2020-12-01 SURGICAL SUPPLY — 25 items
BAG HAMPER (MISCELLANEOUS) ×3 IMPLANT
CLOTH BEACON ORANGE TIMEOUT ST (SAFETY) ×3 IMPLANT
COVER LIGHT HANDLE STERIS (MISCELLANEOUS) ×6 IMPLANT
DRAPE HALF SHEET 40X57 (DRAPES) ×6 IMPLANT
ELECT REM PT RETURN 9FT ADLT (ELECTROSURGICAL) ×3
ELECTRODE REM PT RTRN 9FT ADLT (ELECTROSURGICAL) ×1 IMPLANT
GAUZE SPONGE 4X4 12PLY STRL (GAUZE/BANDAGES/DRESSINGS) ×3 IMPLANT
GLOVE SURG ENC MOIS LTX SZ6.5 (GLOVE) ×3 IMPLANT
GLOVE SURG UNDER POLY LF SZ7 (GLOVE) ×9 IMPLANT
GOWN STRL REUS W/TWL LRG LVL3 (GOWN DISPOSABLE) ×6 IMPLANT
HEMOSTAT SURGICEL 4X8 (HEMOSTASIS) ×3 IMPLANT
KIT TURNOVER CYSTO (KITS) ×3 IMPLANT
MANIFOLD NEPTUNE II (INSTRUMENTS) ×3 IMPLANT
NEEDLE HYPO 18GX1.5 BLUNT FILL (NEEDLE) ×3 IMPLANT
NEEDLE HYPO 21X1.5 SAFETY (NEEDLE) ×3 IMPLANT
NS IRRIG 1000ML POUR BTL (IV SOLUTION) ×3 IMPLANT
PACK PERI GYN (CUSTOM PROCEDURE TRAY) ×3 IMPLANT
PAD ARMBOARD 7.5X6 YLW CONV (MISCELLANEOUS) ×3 IMPLANT
SET BASIN LINEN APH (SET/KITS/TRAYS/PACK) ×3 IMPLANT
SHEARS HARMONIC 9CM CVD (BLADE) ×3 IMPLANT
SPONGE SURGIFOAM ABS GEL 100 (HEMOSTASIS) ×3 IMPLANT
SURGILUBE 2OZ TUBE FLIPTOP (MISCELLANEOUS) ×3 IMPLANT
SUT SILK 0 FSL (SUTURE) ×3 IMPLANT
SYR 20ML LL LF (SYRINGE) ×6 IMPLANT
SYR BULB IRRIG 60ML STRL (SYRINGE) ×3 IMPLANT

## 2020-12-01 NOTE — Anesthesia Preprocedure Evaluation (Signed)
Anesthesia Evaluation  Patient identified by MRN, date of birth, ID band Patient awake    Reviewed: Allergy & Precautions, H&P , NPO status , Patient's Chart, lab work & pertinent test results, reviewed documented beta blocker date and time   Airway Mallampati: II  TM Distance: >3 FB Neck ROM: full    Dental no notable dental hx.    Pulmonary neg pulmonary ROS, Current Smoker,    Pulmonary exam normal breath sounds clear to auscultation       Cardiovascular Exercise Tolerance: Good negative cardio ROS   Rhythm:regular Rate:Normal     Neuro/Psych PSYCHIATRIC DISORDERS  Neuromuscular disease    GI/Hepatic Neg liver ROS, GERD  Medicated,  Endo/Other  negative endocrine ROS  Renal/GU negative Renal ROS  negative genitourinary   Musculoskeletal   Abdominal   Peds  Hematology negative hematology ROS (+)   Anesthesia Other Findings   Reproductive/Obstetrics negative OB ROS                             Anesthesia Physical Anesthesia Plan  ASA: 2  Anesthesia Plan: General and General LMA   Post-op Pain Management:    Induction:   PONV Risk Score and Plan: Ondansetron  Airway Management Planned:   Additional Equipment:   Intra-op Plan:   Post-operative Plan:   Informed Consent: I have reviewed the patients History and Physical, chart, labs and discussed the procedure including the risks, benefits and alternatives for the proposed anesthesia with the patient or authorized representative who has indicated his/her understanding and acceptance.     Dental Advisory Given  Plan Discussed with: CRNA  Anesthesia Plan Comments:         Anesthesia Quick Evaluation

## 2020-12-01 NOTE — Interval H&P Note (Signed)
History and Physical Interval Note:  12/01/2020 8:30 AM  Kimberly Norris  has presented today for surgery, with the diagnosis of Grade III Hemorrhoids.  The various methods of treatment have been discussed with the patient and family. After consideration of risks, benefits and other options for treatment, the patient has consented to  Procedure(s): HEMORRHOIDECTOMY; SIMPLE (N/A) as a surgical intervention.  The patient's history has been reviewed, patient examined, no change in status, stable for surgery.  I have reviewed the patient's chart and labs.  Questions were answered to the patient's satisfaction.     [name]

## 2020-12-01 NOTE — Transfer of Care (Signed)
Immediate Anesthesia Transfer of Care Note  Patient: Kimberly Norris  Procedure(s) Performed: HEMORRHOIDECTOMY; SIMPLE (Rectum)  Patient Location: PACU  Anesthesia Type:General  Level of Consciousness: awake  Airway & Oxygen Therapy: Patient Spontanous Breathing and Patient connected to nasal cannula oxygen  Post-op Assessment: Report given to RN and Post -op Vital signs reviewed and stable  Post vital signs: Reviewed and stable  Last Vitals:  Vitals Value Taken Time  BP 111/79 12/01/20 0951  Temp    Pulse 84 12/01/20 0952  Resp 13 12/01/20 0952  SpO2 97 % 12/01/20 0952  Vitals shown include unvalidated device data.  Last Pain:  Vitals:   12/01/20 0741  PainSc: 0-No pain         Complications: No notable events documented.

## 2020-12-01 NOTE — Anesthesia Postprocedure Evaluation (Signed)
Anesthesia Post Note  Patient: Kimberly Norris  Procedure(s) Performed: HEMORRHOIDECTOMY; SIMPLE (Rectum)  Patient location during evaluation: Phase II Anesthesia Type: General Level of consciousness: awake Pain management: pain level controlled Vital Signs Assessment: post-procedure vital signs reviewed and stable Respiratory status: spontaneous breathing and respiratory function stable Cardiovascular status: blood pressure returned to baseline and stable Postop Assessment: no headache and no apparent nausea or vomiting Anesthetic complications: no Comments: Late entry   No notable events documented.   Last Vitals:  Vitals:   12/01/20 1015 12/01/20 1026  BP: (!) 98/59 126/60  Pulse: 76 74  Resp: 15 16  Temp:  (!) 36.4 C  SpO2: 96%     Last Pain:  Vitals:   12/01/20 1026  TempSrc: Axillary  PainSc: 3                  [name]

## 2020-12-01 NOTE — Anesthesia Procedure Notes (Signed)
Procedure Name: Intubation Date/Time: 12/01/2020 9:10 AM Performed by: Julian Reil, CRNA Pre-anesthesia Checklist: Patient identified, Emergency Drugs available, Suction available and Patient being monitored Patient Re-evaluated:Patient Re-evaluated prior to induction Oxygen Delivery Method: Circle system utilized Preoxygenation: Pre-oxygenation with 100% oxygen Induction Type: IV induction, Rapid sequence and Cricoid Pressure applied Laryngoscope Size: Miller and 3 Grade View: Grade I Tube type: Oral Tube size: 7.0 mm Number of attempts: 1 Placement Confirmation: positive ETCO2, ETT inserted through vocal cords under direct vision and breath sounds checked- equal and bilateral Secured at: 22 cm Tube secured with: Tape Dental Injury: Teeth and Oropharynx as per pre-operative assessment  Comments: Oral airway placed gently for bite block post-proc.

## 2020-12-01 NOTE — Progress Notes (Signed)
[facility]  Updated sister. Pain and pressure likely. Use pain meds, keep stools soft and regular. See in 4 weeks. Rx sent to [facility] in [place].  Algis Greenhouse, MD [facility] [address] [place], [place] [address] [phone] (office)

## 2020-12-01 NOTE — Op Note (Signed)
[facility] Operative Note  12/01/20  Preoperative Diagnosis: Grade III hemorrhoids    Postoperative Diagnosis: Grade III right anterior hemorrhoid column with tag and grade II right posterior hemorrhoid column   Procedure(s) Performed: Simple hemorrhoidectomy (internal and external component of right anterior, internal component right posterior)    Surgeon: Leatrice Jewels. Henreitta Leber, MD   Assistants: No qualified resident was available    Anesthesia: General endotracheal   Anesthesiologist: [name], MD    Specimens: Right anterior and right posterior hemorrhoid    Estimated Blood Loss: Minimal   Blood Replacement: None    Complications: None   Wound Class: Dirty (stool in vault)    Operative Indications: Ms. Kimberly Norris is a 25 yo with a worsening right anterior hemorrhoid with external skin tag. This has been causing her pain. She had a prior fissure which had healed but the hemorrhoid was getting larger and the bulge was getting more uncomfortable. We discussed hemorrhoidectomy and exam under anesthesia, risk of bleeding, infection, injury to sphincter, incontinence, and she opted to proceed.   Findings: Right anterior hemorrhoid with external tag, grade III; right posterior hemorrhoid grade II, healed posterior fissure    Procedure: The patient was taken to the operating room and placed supine. General endotracheal anesthesia was induced. Intravenous antibiotics were not administered given the location of the case. The anal and perineal area were prepped and draped in the usual sterile fashion.   Digital exam was normal with no masses and normal tone. There was an obvious right anterior hemorrhoid with skin tag noted and a small right posterior hemorrhoid on anoscopy. The fissure had healed.   The externa skin tag and right anterior hemorrhoid column was taken in its entirety after the hemorrhoid column was grasped with DeBakey's and elevated away from the  sphincter complex. Using cautery and a harmonic energy device the column was removed. Hemostasis was confirmed. The right posterior internal likely grade II hemorrhoid was excised in the similar fashion ensuring the sphincter complex was protected. The left lateral hemorrhoid column was minimal.  A gelfoam and Surgicel roll with viscous lidocaine was placed and tagged with a silk suture. Abd and mesh panties were placed.   Final inspection revealed acceptable hemostasis. All counts were correct at the end of the case. The patient was awakened from anesthesia and extubated without complication.  The patient went to the PACU in stable condition.   Algis Greenhouse, MD [facility] [address] [place] [place] [place], [place] [address] [phone] (office)

## 2020-12-01 NOTE — Discharge Instructions (Signed)
Discharge Instructions: Please take your roxicodone as prescribed and alternate with tylenol every 4-6 hours.   Do not take any aspirin or NSAIDs, ibuprofen, aleve, BC powder for 5 days.  After 12/06/2020 you can start taking these medications again.  Please keep the area clean and dry and take Sitz baths (swallow warm water baths) for comfort and after bowel movements.  If you cannot get in a bath tub, you can purchase a Sitz bath that goes on the toilet at the pharmacy.  Please keep your stools soft and take fiber daily (metamucil) and colace (over the counter) to help prevent constipation.  If you have not had a BM in 2 days, please take Miralax, and if you have not had a BM after this, please notify Dr. Henreitta Leber.  Expect some bleeding following the hemorrhoid surgery and significant pain.  Go to the ED with extensive bleeding (soaking 2 large pads in < 1 hour), fevers, or chills.    Shower per your regular routine daily.   Rest and listen to your body, but do not remain in bed all day.  Walk everyday for at least 15-20 minutes. Deep cough and move around every 1-2 hours in the first few days after surgery.  Do not lift > 10 lbs, perform excessive bending, pushing, pulling, squatting. You will have pressure and pain at your anus and this is common. You may want to obtain a donut pillow from your pharmacy to sit on.   Some nausea is common and poor appetite. The main goal is to stay hydrated the first few days after surgery.   Pain Expectations and Narcotics: -After surgery you will have pain associated with your surgery and this is normal. The pain is muscular and nerve pain, and will get better with time. -You are encouraged and expected to take non narcotic medications like tylenol and ibuprofen (when able) to treat pain as multiple modalities can aid with pain treatment. -Narcotics are only used when pain is severe or there is breakthrough pain. -You are not expected to have a pain score of  0 after surgery, as we cannot prevent pain. A pain score of 3-4 that allows you to be functional, move, walk, and tolerate some activity is the goal. The pain will continue to improve over the days after surgery and is dependent on your surgery. -Due to [place] law, we are only able to give a certain amount of pain medication to treat post operative pain, and we only give additional narcotics on a patient by patient basis.  -For most laparoscopic surgery, studies have shown that the majority of patients only need 10-15 narcotic pills, and for open surgeries or anal surgeries most patients only need 15-20.   -Having appropriate expectations of pain and knowledge of pain management with non narcotics is important as we do not want anyone to become addicted to narcotic pain medication.  -Doing your Sitz baths will help with pain and pressure discomfort.  -Simple acts like meditation and mindfulness practices after surgery can also help with pain control and research has proven the benefit of these practices.  Contact Information: If you have questions or concerns, please call our office, [phone], Monday- Thursday 8AM-5PM and Friday 8AM-12Noon.  If it is after hours or on the weekend, please call [facility] Main Number, [phone], [phone],  and ask to speak to the surgeon on call for Dr. Henreitta Leber at [facility].

## 2020-12-02 ENCOUNTER — Encounter (HOSPITAL_COMMUNITY): Payer: Self-pay | Admitting: General Surgery

## 2020-12-02 ENCOUNTER — Other Ambulatory Visit: Payer: Self-pay

## 2020-12-02 ENCOUNTER — Observation Stay (HOSPITAL_COMMUNITY)
Admission: EM | Admit: 2020-12-02 | Discharge: 2020-12-03 | Disposition: A | Discharge status: Home / Self Care | Payer: Medicaid Other | Attending: General Surgery | Admitting: General Surgery

## 2020-12-02 DIAGNOSIS — K91840 Postprocedural hemorrhage and hematoma of a digestive system organ or structure following a digestive system procedure: Secondary | ICD-10-CM | POA: Diagnosis not present

## 2020-12-02 DIAGNOSIS — Z79899 Other long term (current) drug therapy: Secondary | ICD-10-CM | POA: Insufficient documentation

## 2020-12-02 DIAGNOSIS — Z20822 Contact with and (suspected) exposure to covid-19: Secondary | ICD-10-CM | POA: Diagnosis not present

## 2020-12-02 DIAGNOSIS — K625 Hemorrhage of anus and rectum: Secondary | ICD-10-CM | POA: Diagnosis present

## 2020-12-02 DIAGNOSIS — F17210 Nicotine dependence, cigarettes, uncomplicated: Secondary | ICD-10-CM | POA: Insufficient documentation

## 2020-12-02 LAB — CBC WITH DIFFERENTIAL/PLATELET
Abs Immature Granulocytes: 0.04 10*3/uL (ref 0.00–0.07)
Basophils Absolute: 0 10*3/uL (ref 0.0–0.1)
Basophils Relative: 0 %
Eosinophils Absolute: 0.1 10*3/uL (ref 0.0–0.5)
Eosinophils Relative: 0 %
HCT: 33.7 % — ABNORMAL LOW (ref 36.0–46.0)
Hemoglobin: 11.7 g/dL — ABNORMAL LOW (ref 12.0–15.0)
Immature Granulocytes: 0 %
Lymphocytes Relative: 17 %
Lymphs Abs: 2.4 10*3/uL (ref 0.7–4.0)
MCH: 33.6 pg (ref 26.0–34.0)
MCHC: 34.7 g/dL (ref 30.0–36.0)
MCV: 96.8 fL (ref 80.0–100.0)
Monocytes Absolute: 0.6 10*3/uL (ref 0.1–1.0)
Monocytes Relative: 4 %
Neutro Abs: 11 10*3/uL — ABNORMAL HIGH (ref 1.7–7.7)
Neutrophils Relative %: 79 %
Platelets: 317 10*3/uL (ref 150–400)
RBC: 3.48 MIL/uL — ABNORMAL LOW (ref 3.87–5.11)
RDW: 12.6 % (ref 11.5–15.5)
WBC: 14.2 10*3/uL — ABNORMAL HIGH (ref 4.0–10.5)
nRBC: 0 % (ref 0.0–0.2)

## 2020-12-02 LAB — PROTIME-INR
INR: 1.1 (ref 0.8–1.2)
Prothrombin Time: 13.9 seconds (ref 11.4–15.2)

## 2020-12-02 LAB — SURGICAL PATHOLOGY

## 2020-12-02 LAB — BASIC METABOLIC PANEL
Anion gap: 7 (ref 5–15)
BUN: 10 mg/dL (ref 6–20)
CO2: 21 mmol/L — ABNORMAL LOW (ref 22–32)
Calcium: 8.7 mg/dL — ABNORMAL LOW (ref 8.9–10.3)
Chloride: 111 mmol/L (ref 98–111)
Creatinine, Ser: 0.6 mg/dL (ref 0.44–1.00)
GFR, Estimated: >60 mL/min (ref >60)
Glucose, Bld: 96 mg/dL (ref 70–99)
Potassium: 3.6 mmol/L (ref 3.5–5.1)
Sodium: 139 mmol/L (ref 135–145)

## 2020-12-02 LAB — RESP PANEL BY RT-PCR (FLU A&B, COVID) ARPGX2
Influenza A by PCR: NEGATIVE
Influenza B by PCR: NEGATIVE
SARS Coronavirus 2 by RT PCR: NEGATIVE

## 2020-12-02 MED ORDER — NICOTINE 21 MG/24HR TD PT24
21.0000 mg | MEDICATED_PATCH | Freq: Every day | TRANSDERMAL | Status: DC
  Administered 2020-12-02: 22:00:00 21 mg via TRANSDERMAL
  Filled 2020-12-02: qty 1

## 2020-12-02 MED ORDER — DIPHENHYDRAMINE HCL 12.5 MG/5ML PO ELIX
12.5000 mg | ORAL_SOLUTION | Freq: Four times a day (QID) | ORAL | Status: DC | PRN
  Administered 2020-12-02: 20:00:00 12.5 mg via ORAL
  Filled 2020-12-02: qty 5

## 2020-12-02 MED ORDER — ONDANSETRON 4 MG PO TBDP: 4.0000 mg | ORAL_TABLET | Freq: Four times a day (QID) | ORAL | Status: DC | PRN

## 2020-12-02 MED ORDER — DIPHENHYDRAMINE HCL 50 MG/ML IJ SOLN: 12.5000 mg | Freq: Four times a day (QID) | INTRAMUSCULAR | Status: DC | PRN

## 2020-12-02 MED ORDER — DOCUSATE SODIUM 100 MG PO CAPS
100.0000 mg | ORAL_CAPSULE | Freq: Two times a day (BID) | ORAL | Status: DC
  Administered 2020-12-02 – 2020-12-03 (×3): 100 mg via ORAL
  Filled 2020-12-02 (×3): qty 1

## 2020-12-02 MED ORDER — MENTHOL 3 MG MT LOZG
1.0000 | LOZENGE | OROMUCOSAL | Status: DC | PRN
  Filled 2020-12-02: qty 9

## 2020-12-02 MED ORDER — OXYCODONE HCL 5 MG PO TABS: 5.0000 mg | ORAL_TABLET | ORAL | Status: DC | PRN

## 2020-12-02 MED ORDER — ONDANSETRON HCL 4 MG/2ML IJ SOLN: 4.0000 mg | Freq: Four times a day (QID) | INTRAMUSCULAR | Status: DC | PRN

## 2020-12-02 MED ORDER — ACETAMINOPHEN 500 MG PO TABS
1000.0000 mg | ORAL_TABLET | Freq: Four times a day (QID) | ORAL | Status: DC
  Administered 2020-12-02 – 2020-12-03 (×3): 1000 mg via ORAL
  Filled 2020-12-02 (×4): qty 2

## 2020-12-02 MED ORDER — METHOCARBAMOL 500 MG PO TABS
500.0000 mg | ORAL_TABLET | Freq: Four times a day (QID) | ORAL | Status: DC | PRN
  Administered 2020-12-02: 21:00:00 500 mg via ORAL
  Filled 2020-12-02: qty 1

## 2020-12-02 NOTE — Progress Notes (Signed)
Dr Henreitta Leber notified that patient wants to leave AMA because she wants to go outside to smoke and states that the patch does not work. I have notified patient's nurse.

## 2020-12-02 NOTE — ED Provider Notes (Signed)
[facility] EMERGENCY DEPARTMENT Provider Note   CSN: [id number] Arrival date & time: 12/02/20  2353     History Chief Complaint  Patient presents with   Rectal Bleeding    Kimberly Norris is a 25 y.o. female.  Pt had hemorrhoid surgery yesterday.  Pt reports she is passing large clots of blood.  Pt has pictures of large clots    The history is provided by the patient. No language interpreter was used.  Rectal Bleeding Quality:  Bright red Amount:  Moderate Duration:  1 day Timing:  Constant Context: rectal pain   Pain details:    Quality:  Aching   Severity:  Moderate   Duration:  1 day   Timing:  Constant   Progression:  Worsening Similar prior episodes: no   Relieved by:  Nothing Worsened by:  Nothing Ineffective treatments:  None tried Associated symptoms: no abdominal pain, no light-headedness and no vomiting       Past Medical History:  Diagnosis Date   Bilateral chronic knee pain 04/16/2012   GERD (gastroesophageal reflux disease)    Heartburn    Liver disease    fatty liver   Mental disorder    anxiety, depression   Rhabdomyolysis     Patient Active Problem List   Diagnosis Date Noted   Grade III hemorrhoids 11/16/2020   Family history of breast cancer in mother 11/03/2020   Nipple pain 11/03/2020   Nexplanon in place 11/03/2020   Encounter for well woman exam with routine gynecological exam 11/03/2020   Adjustment disorder with emotional disturbance    Self-injurious behavior    Dysphagia 04/29/2020   Abdominal wall bulge 04/29/2020   Alternating constipation and diarrhea 03/17/2020   RUQ abdominal pain 03/17/2020   Anal fissure 02/17/2020   Skin tag of perianal region 02/17/2020   Cold sore 12/18/2019   Screen for STD (sexually transmitted disease) 12/18/2019   Urinary frequency 09/16/2019   Vaginal itching 09/16/2019   Nausea without vomiting 08/18/2019   Gastroesophageal reflux disease 08/18/2019   Loss of weight 08/18/2019   Fatty  liver 08/18/2019   Early satiety 08/18/2019   Encounter for gynecological examination with Papanicolaou smear of cervix 07/25/2018   Traumatic rhabdomyolysis (HCC) 07/12/2015   Rhabdomyolysis 07/12/2015   BMI (body mass index), pediatric, 95-99% for age 65/16/2014   Acquired genu valgum, 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  All pathology was benign.    HEMORRHOID SURGERY N/A 12/01/2020   Procedure: HEMORRHOIDECTOMY; SIMPLE;  Surgeon: [name], MD;  Location: [facility] ORS;  Service: General;  Laterality: N/A;   NO PAST SURGERIES       OB History     Gravida  0   Para  0   Term  0   Preterm  0   AB  0   Living  0      SAB  0   IAB  0   Ectopic  0   Multiple  0   Live Births  0  Family History  Problem Relation Age of Onset   Breast cancer Mother 68   Autism Sister        1 sister    Social History   Tobacco Use   Smoking status: Every Day    Packs/day: 0.50    Years: 5.00    Pack years: 2.50    Types: Cigarettes   Smokeless tobacco: Never   Tobacco comments:    smokes 1-2 cig daily  Vaping Use   Vaping Use: Former  Substance Use Topics   Alcohol use: Yes    Comment: rarely   Drug use: No    Home Medications Prior to Admission medications   Medication Sig Start Date End Date Taking? Authorizing Provider  acetaminophen (TYLENOL) 500 MG tablet Take 500 mg by mouth every 6 (six) hours as needed.   Yes [provider]  calcium  carbonate (TUMS - DOSED IN MG ELEMENTAL CALCIUM) 500 MG chewable tablet Chew 1 tablet by mouth daily as needed for heartburn. As needed   Yes [provider]  etonogestrel (NEXPLANON) 68 MG IMPL implant Inject 68 mg into the skin once.    Yes [provider]  Metamucil Fiber CHEW Chew 2 each by mouth daily.   Yes [provider]  ondansetron (ZOFRAN) 4 MG tablet Take 1 tablet (4 mg total) by mouth every 8 (eight) hours as needed. 12/01/20 12/01/21 Yes Virl Cagey, MD  oxyCODONE (ROXICODONE) 5 MG immediate release tablet Take 1 tablet (5 mg total) by mouth every 4 (four) hours as needed for severe pain or breakthrough pain. 12/01/20 12/01/21 Yes Virl Cagey, MD  pantoprazole (PROTONIX) 40 MG tablet Take 1 tablet (40 mg total) by mouth daily before breakfast. 09/01/20  Yes Erenest Rasher, PA-C  Probiotic Product (PROBIOTIC DAILY PO) Take 1 each by mouth daily.   Yes [provider]    Allergies    Other, Flagyl [metronidazole], and Tinidazole  Review of Systems   Review of Systems  Gastrointestinal:  Positive for hematochezia. Negative for abdominal pain and vomiting.  Neurological:  Negative for light-headedness.  All other systems reviewed and are negative.  Physical Exam Updated Vital Signs BP 115/74   Pulse 84   Temp 98.7 F (37.1 C) (Oral)   Resp 18   Ht 5\' 1"  (1.549 m)   Wt 87.5 kg   SpO2 98%   BMI 36.47 kg/m   Physical Exam Vitals and nursing note reviewed.  Constitutional:      Appearance: She is well-developed.  HENT:     Head: Normocephalic.     Mouth/Throat:     Mouth: Mucous membranes are moist.  Cardiovascular:     Rate and Rhythm: Normal rate.  Pulmonary:     Effort: Pulmonary effort is normal.  Abdominal:     General: Abdomen is flat. There is no distension.  Genitourinary:    Comments: Dried blood external rectum  Musculoskeletal:        General: Normal range of motion.     Cervical back: Normal range  of motion.  Skin:    General: Skin is warm.  Neurological:     Mental Status: She is alert and oriented to person, place, and time.  Psychiatric:        Mood and Affect: Mood normal.        Behavior: Behavior normal.    ED Results / Procedures / Treatments   Labs (all labs ordered are listed, but only abnormal results  are displayed) Labs Reviewed  CBC WITH DIFFERENTIAL/PLATELET - Abnormal; Notable for the following components:      Result Value   WBC 14.2 (*)    RBC 3.48 (*)    Hemoglobin 11.7 (*)    HCT 33.7 (*)    Neutro Abs 11.0 (*)    All other components within normal limits  BASIC METABOLIC PANEL - Abnormal; Notable for the following components:   CO2 21 (*)    Calcium 8.7 (*)    All other components within normal limits  PROTIME-INR    EKG None  Radiology No results found.  Procedures Procedures   Medications Ordered in ED Medications - No data to display  ED Course  I have reviewed the triage vital signs and the nursing notes.  Pertinent labs & imaging results that were available during my care of the patient were reviewed by me and considered in my medical decision making (see chart for details).    MDM Rules/Calculators/A&P                          MDM:  Pt's hemoglobin slightly decreased to 11.7.  from previous normal of 13.   The patient appears reasonably stabilized for admission considering the current resources, flow, and capabilities available in the ED at this time, and I doubt any other [facility] requiring further screening and/or treatment in the ED prior to admission.  Final Clinical Impression(s) / ED Diagnoses Final diagnoses:  Rectal bleeding    Rx / DC Orders ED Discharge Orders     None        Sidney Ace 12/02/20 1224    Fredia Sorrow, MD 12/03/20 [phone]

## 2020-12-02 NOTE — H&P (Signed)
[facility] History and Physical  Reason for Referral: Hemorrhoidectomy bleeding  Referring Physician: ED   Chief Complaint   Rectal Bleeding     Kimberly Norris is a 25 y.o. female.  HPI: Ms. Kimberly Norris is known to me s/p hemorrhoidectomy on Wednesday. She has been passing some clots and feels like she has passed large orange size clots that continue. She was very anxious and called the office. We told her to go to the. ED. In the ED she continued to have blood per rectum, and her H&H is down. She was admitted for monitoring and observation pending need to do anything for post operative bleeding.   Past Medical History:  Diagnosis Date   Bilateral chronic knee pain 04/16/2012   GERD (gastroesophageal reflux disease)    Heartburn    Liver disease    fatty liver   Mental disorder    anxiety, depression   Rhabdomyolysis     Past Surgical History:  Procedure Laterality Date   BIOPSY  09/11/2019   Procedure: BIOPSY;  Surgeon: Lanelle Bal, DO;  Location[facility];  Service: Endoscopy;;   ESOPHAGOGASTRODUODENOSCOPY (EGD) WITH PROPOFOL N/A 09/11/2019   Procedure: ESOPHAGOGASTRODUODENOSCOPY (EGD) WITH PROPOFOL;  Surgeon: Lanelle Bal, DO;  Esophageal mucosal changes suspicious for eosinophilic esophagitis s/p biopsy, gastritis s/p biopsy, normal examined duodenum biopsy.  All pathology was benign.    HEMORRHOID SURGERY N/A 12/01/2020   Procedure: HEMORRHOIDECTOMY; SIMPLE;  Surgeon: [name], MD;  Location: [facility] ORS;  Service: General;  Laterality: N/A;   NO PAST SURGERIES      Family History  Problem Relation Age of Onset   Breast cancer Mother 36   Autism Sister        1 sister    Social History   Tobacco Use   Smoking status: Every Day    Packs/day: 0.50    Years: 5.00    Pack years: 2.50    Types: Cigarettes   Smokeless tobacco: Never   Tobacco comments:    smokes 1-2 cig daily  Vaping Use   Vaping Use: Former  Substance Use  Topics   Alcohol use: Yes    Comment: rarely   Drug use: No    Medications: I have reviewed the patient's current medications. Prior to Admission:  Medications Prior to Admission  Medication Sig Dispense Refill Last Dose   acetaminophen (TYLENOL) 500 MG tablet Take 500 mg by mouth every 6 (six) hours as needed.      calcium carbonate (TUMS - DOSED IN MG ELEMENTAL CALCIUM) 500 MG chewable tablet Chew 1 tablet by mouth daily as needed for heartburn. As needed      etonogestrel (NEXPLANON) 68 MG IMPL implant Inject 68 mg into the skin once.       Metamucil Fiber CHEW Chew 2 each by mouth daily.   12/01/2020   ondansetron (ZOFRAN) 4 MG tablet Take 1 tablet (4 mg total) by mouth every 8 (eight) hours as needed. 30 tablet 1    oxyCODONE (ROXICODONE) 5 MG immediate release tablet Take 1 tablet (5 mg total) by mouth every 4 (four) hours as needed for severe pain or breakthrough pain. 30 tablet 0 12/01/2020   pantoprazole (PROTONIX) 40 MG tablet Take 1 tablet (40 mg total) by mouth daily before breakfast. 30 tablet 3 Past Week   Probiotic Product (PROBIOTIC DAILY PO) Take 1 each by mouth daily.   Past Week   Scheduled:  acetaminophen  1,000 mg Oral Q6H  docusate sodium  100 mg Oral BID   Continuous: FIE:PPIRJJOACZYSAYT **OR** diphenhydrAMINE, methocarbamol, ondansetron **OR** ondansetron (ZOFRAN) IV, oxyCODONE    ROS:  A comprehensive review of systems was negative except for: Gastrointestinal: positive for constipation and rectal bleeding, clots  Blood pressure (!) 103/44, pulse 83, temperature 98.3 F (36.8 C), temperature source Oral, resp. rate 18, height 5\' 1"  (1.549 m), weight 88.8 kg, SpO2 100 %. Physical Exam Vitals reviewed.  Constitutional:      Appearance: Normal appearance.  Eyes:     Extraocular Movements: Extraocular movements intact.  Cardiovascular:     Rate and Rhythm: Normal rate.  Pulmonary:     Effort: Pulmonary effort is normal.  Abdominal:     General: There  is no distension.     Palpations: Abdomen is soft.     Tenderness: There is no abdominal tenderness.  Genitourinary:    Comments: Minor blood on pad, no signs of swelling or unexpected drainage externally around rectum, no gross blood Skin:    General: Skin is warm.  Neurological:     General: No focal deficit present.     Mental Status: She is alert and oriented to person, place, and time.  Psychiatric:        Mood and Affect: Mood normal.        Behavior: Behavior normal.    Results: Results for orders placed or performed during the hospital encounter of 12/02/20 (from the past 48 hour(s))  CBC with Differential/Platelet     Status: Abnormal   Collection Time: 12/02/20 11:11 AM  Result Value Ref Range   WBC 14.2 (H) 4.0 - 10.5 K/uL   RBC 3.48 (L) 3.87 - 5.11 MIL/uL   Hemoglobin 11.7 (L) 12.0 - 15.0 g/dL   HCT 12/04/20 (L) 01.6 - 01.0 %   MCV 96.8 80.0 - 100.0 fL   MCH 33.6 26.0 - 34.0 pg   MCHC 34.7 30.0 - 36.0 g/dL   RDW 93.2 35.5 - 73.2 %   Platelets 317 150 - 400 K/uL   nRBC 0.0 0.0 - 0.2 %   Neutrophils Relative % 79 %   Neutro Abs 11.0 (H) 1.7 - 7.7 K/uL   Lymphocytes Relative 17 %   Lymphs Abs 2.4 0.7 - 4.0 K/uL   Monocytes Relative 4 %   Monocytes Absolute 0.6 0.1 - 1.0 K/uL   Eosinophils Relative 0 %   Eosinophils Absolute 0.1 0.0 - 0.5 K/uL   Basophils Relative 0 %   Basophils Absolute 0.0 0.0 - 0.1 K/uL   Immature Granulocytes 0 %   Abs Immature Granulocytes 0.04 0.00 - 0.07 K/uL    Comment: Performed at [facility], [address] [place], [place]  Basic metabolic panel     Status: Abnormal   Collection Time: 12/02/20 11:11 AM  Result Value Ref Range   Sodium 139 135 - 145 mmol/L   Potassium 3.6 3.5 - 5.1 mmol/L   Chloride 111 98 - 111 mmol/L   CO2 21 (L) 22 - 32 mmol/L   Glucose, Bld 96 70 - 99 mg/dL    Comment: Glucose reference range applies only to samples taken after fasting for at least 8 hours.   BUN 10 6 - 20 mg/dL   Creatinine, Ser  12/04/20 0.44 - 1.00 mg/dL   Calcium 8.7 (L) 8.9 - 10.3 mg/dL   GFR, Estimated 6.23 >76 mL/min    Comment: (NOTE) Calculated using the CKD-EPI Creatinine Equation (2021)    Anion gap 7  5 - 15    Comment: Performed at [facility], [address], [place], [place] [address]  Protime-INR     Status: None   Collection Time: 12/02/20 11:11 AM  Result Value Ref Range   Prothrombin Time 13.9 11.4 - 15.2 seconds   INR 1.1 0.8 - 1.2    Comment: (NOTE) INR goal varies based on device and disease states. Performed at [facility], [address] [place], [place] [address]   Resp Panel by RT-PCR (Flu A&B, Covid) Nasopharyngeal Swab     Status: None   Collection Time: 12/02/20 12:49 PM   Specimen: Nasopharyngeal Swab; Nasopharyngeal(NP) swabs in vial transport medium  Result Value Ref Range   SARS Coronavirus 2 by RT PCR NEGATIVE NEGATIVE    Comment: (NOTE) SARS-CoV-2 target nucleic acids are NOT DETECTED.  The SARS-CoV-2 RNA is generally detectable in upper respiratory specimens during the acute phase of infection. The lowest concentration of SARS-CoV-2 viral copies this assay can detect is 138 copies/mL. A negative result does not preclude SARS-Cov-2 infection and should not be used as the sole basis for treatment or other patient management decisions. A negative result may occur with  improper specimen collection/handling, submission of specimen other than nasopharyngeal swab, presence of viral mutation(s) within the areas targeted by this assay, and inadequate number of viral copies(<138 copies/mL). A negative result must be combined with clinical observations, patient history, and epidemiological information. The expected result is Negative.  Fact Sheet for Patients:  BloggerCourse.com  Fact Sheet for Healthcare Providers:  SeriousBroker.it  This test is no t yet approved or cleared by the [place] FDA and  has been authorized for  detection and/or diagnosis of SARS-CoV-2 by FDA under an Emergency Use Authorization (EUA). This EUA will remain  in effect (meaning this test can be used) for the duration of the COVID-19 declaration under Section 564(b)(1) of the Act, 21 U.S.C.section 360bbb-3(b)(1), unless the authorization is terminated  or revoked sooner.       Influenza A by PCR NEGATIVE NEGATIVE   Influenza B by PCR NEGATIVE NEGATIVE    Comment: (NOTE) The Xpert Xpress SARS-CoV-2/FLU/RSV plus assay is intended as an aid in the diagnosis of influenza from Nasopharyngeal swab specimens and should not be used as a sole basis for treatment. Nasal washings and aspirates are unacceptable for Xpert Xpress SARS-CoV-2/FLU/RSV testing.  Fact Sheet for Patients: BloggerCourse.com  Fact Sheet for Healthcare Providers: SeriousBroker.it  This test is not yet approved or cleared by the [place] FDA and has been authorized for detection and/or diagnosis of SARS-CoV-2 by FDA under an Emergency Use Authorization (EUA). This EUA will remain in effect (meaning this test can be used) for the duration of the COVID-19 declaration under Section 564(b)(1) of the Act, 21 U.S.C. section 360bbb-3(b)(1), unless the authorization is terminated or revoked.  Performed at [facility], [address] [place], [place] [address]       Assessment & Plan:  DEBRALEE BRAAKSMA is a 25 y.o. female with some post operative bleeding afer hemorrhoidectomy. She was passing clots and continues. Hold all NSAIDs, no DVT prophylaxis.   PRN for pain Diet NPO midnight CBC in AM Reassurance Have Bms in hat, and will potentially keep having Bms that are bloody, some blood is normal.   All questions were answered to the satisfaction of the patient.   [name] 12/02/2020, 5:22 PM

## 2020-12-02 NOTE — ED Triage Notes (Signed)
Pt to the ED with rectal bleeding after having had a hemorrhoidectomy yesterday.

## 2020-12-02 NOTE — ED Notes (Signed)
Pt last meal was last night around 10pm, pt has something to drink this moring around 9am

## 2020-12-02 NOTE — Plan of Care (Signed)

## 2020-12-02 NOTE — Progress Notes (Signed)
[facility]  Will admit for observation overnight and get repeat CBC in the AM. Hgb is down.  BP 115/74   Pulse 84   Temp 98.7 F (37.1 C) (Oral)   Resp 18   Ht 5\' 1"  (1.549 m)   Wt 87.5 kg   SpO2 98%   BMI 36.47 kg/m   PRN For pain IS, OOB Reg diet, NPO midnight Plan for Labs in AM COVID will be ordered. Will see later today.   , MD [facility] [address] [place], [place] [phone] (office)

## 2020-12-03 ENCOUNTER — Encounter (HOSPITAL_COMMUNITY): Payer: Self-pay | Admitting: Certified Registered"

## 2020-12-03 LAB — CBC WITH DIFFERENTIAL/PLATELET
Abs Immature Granulocytes: 0.07 10*3/uL (ref 0.00–0.07)
Basophils Absolute: 0.1 10*3/uL (ref 0.0–0.1)
Basophils Relative: 1 %
Eosinophils Absolute: 0.2 10*3/uL (ref 0.0–0.5)
Eosinophils Relative: 1 %
HCT: 29.1 % — ABNORMAL LOW (ref 36.0–46.0)
Hemoglobin: 9.8 g/dL — ABNORMAL LOW (ref 12.0–15.0)
Immature Granulocytes: 1 %
Lymphocytes Relative: 28 %
Lymphs Abs: 3.7 10*3/uL (ref 0.7–4.0)
MCH: 33.3 pg (ref 26.0–34.0)
MCHC: 33.7 g/dL (ref 30.0–36.0)
MCV: 99 fL (ref 80.0–100.0)
Monocytes Absolute: 0.8 10*3/uL (ref 0.1–1.0)
Monocytes Relative: 6 %
Neutro Abs: 8.5 10*3/uL — ABNORMAL HIGH (ref 1.7–7.7)
Neutrophils Relative %: 63 %
Platelets: 275 10*3/uL (ref 150–400)
RBC: 2.94 MIL/uL — ABNORMAL LOW (ref 3.87–5.11)
RDW: 12.7 % (ref 11.5–15.5)
WBC: 13.3 10*3/uL — ABNORMAL HIGH (ref 4.0–10.5)
nRBC: 0 % (ref 0.0–0.2)

## 2020-12-03 LAB — CBC
HCT: 30.5 % — ABNORMAL LOW (ref 36.0–46.0)
Hemoglobin: 10.5 g/dL — ABNORMAL LOW (ref 12.0–15.0)
MCH: 33.1 pg (ref 26.0–34.0)
MCHC: 34.4 g/dL (ref 30.0–36.0)
MCV: 96.2 fL (ref 80.0–100.0)
Platelets: 300 10*3/uL (ref 150–400)
RBC: 3.17 MIL/uL — ABNORMAL LOW (ref 3.87–5.11)
RDW: 12.4 % (ref 11.5–15.5)
WBC: 14.3 10*3/uL — ABNORMAL HIGH (ref 4.0–10.5)
nRBC: 0 % (ref 0.0–0.2)

## 2020-12-03 SURGERY — HEMORRHOIDECTOMY
Anesthesia: General

## 2020-12-03 MED ORDER — CHLORHEXIDINE GLUCONATE CLOTH 2 % EX PADS
6.0000 | MEDICATED_PAD | Freq: Once | CUTANEOUS | Status: AC
  Administered 2020-12-03: 6 via TOPICAL

## 2020-12-03 MED ORDER — POLYETHYLENE GLYCOL 3350 17 G PO PACK
17.0000 g | PACK | Freq: Once | ORAL | Status: AC
  Administered 2020-12-03: 17 g via ORAL
  Filled 2020-12-03: qty 1

## 2020-12-03 MED ORDER — POLYETHYLENE GLYCOL 3350 17 G PO PACK: 17.0000 g | PACK | Freq: Every day | ORAL | Status: DC

## 2020-12-03 MED ORDER — SODIUM CHLORIDE 0.9 % IV SOLN: 2.0000 g | INTRAVENOUS | Status: DC

## 2020-12-03 NOTE — Discharge Instructions (Signed)
Keep stools soft and regular. Take colace 100 mg twice daily and some miralax for the next few days. Sitz baths as needed and after Bms. Roxicodone for pain. Control to hold on any NSAID (ibuprofen, aleve until after 12/07/2020). Go to the ED with more large clots or bleeding. Some minor bleeding is normal and expected. Blood mixed in the stool is normal and expected. [phone] will get you the operator and ask to speak with Dr. Henreitta Leber with questions or concerns.

## 2020-12-03 NOTE — Discharge Summary (Signed)
Physician Discharge Summary  Patient ID: Kimberly Norris MRN: [id number] DOB/AGE: Aug 21, 1995 25 y.o.  Admit date: 12/02/2020 Discharge date: 12/03/2020  Admission Diagnoses: Rectal bleeding after hemorrhoidectomy   Discharge Diagnoses:  Principal Problem:   Rectal bleeding   Discharged Condition: good  Hospital Course: [name] is was admitted overnight for observation given her rectal bleeding with clots after hemorrhoidectomy. I had discussed potential need for a return to the OR to control bleeding if the H&H continued to drop and her clots continued.  She did have a drift down in her H&H but no further bloody Bms this AM. We discussed repeat H&H and this was actually improved. She had a BM and had stool with some blood mixed but no clots. She had pain which is expected.    Discussed going home for sitz baths and continued care and return precautions.    Consults: None  Significant Diagnostic Studies:   Latest Reference Range & Units 12/02/20 11:11 12/03/20 06:02 12/03/20 09:56  WBC 4.0 - 10.5 K/uL 14.2 (H) 13.3 (H) 14.3 (H)  RBC 3.87 - 5.11 MIL/uL 3.48 (L) 2.94 (L) 3.17 (L)  Hemoglobin 12.0 - 15.0 g/dL 14.4 (L) 9.8 (L) 31.5 (L)  HCT 36.0 - 46.0 % 33.7 (L) 29.1 (L) 30.5 (L)  MCV 80.0 - 100.0 fL 96.8 99.0 96.2  MCH 26.0 - 34.0 pg 33.6 33.3 33.1  MCHC 30.0 - 36.0 g/dL 40.0 86.7 61.9  RDW 50.9 - 15.5 % 12.6 12.7 12.4  Platelets 150 - 400 K/uL 317 275 300  nRBC 0.0 - 0.2 % 0.0 0.0 0.0     Discharge Exam: Blood pressure (!) 102/55, pulse 94, temperature 98.2 F (36.8 C), resp. rate 18, height 5\' 1"  (1.549 m), weight 88.8 kg, SpO2 100 %. General appearance: alert and no distress Resp: normal work of breathing Less anxious   Disposition: Discharge disposition: 01-Home or Self Care       Discharge Instructions     Call MD for:   Complete by: As directed    Large amount of bleeding, clots from rectum, some bleeding expected   Call MD for:  difficulty breathing,  headache or visual disturbances   Complete by: As directed    Call MD for:  extreme fatigue   Complete by: As directed    Call MD for:  persistant nausea and vomiting   Complete by: As directed    Call MD for:  severe uncontrolled pain   Complete by: As directed    Call MD for:  temperature >100.4   Complete by: As directed    Increase activity slowly   Complete by: As directed       Allergies as of 12/03/2020       Reactions   Other Anaphylaxis   Lilly flowers   Flagyl [metronidazole] Rash   Tinidazole Rash        Medication List     TAKE these medications    acetaminophen 500 MG tablet Commonly known as: TYLENOL Take 500 mg by mouth every 6 (six) hours as needed.   calcium carbonate 500 MG chewable tablet Commonly known as: TUMS - dosed in mg elemental calcium Chew 1 tablet by mouth daily as needed for heartburn. As needed   etonogestrel 68 MG Impl implant Commonly known as: NEXPLANON Inject 68 mg into the skin once.   Metamucil Fiber Chew Chew 2 each by mouth daily.   ondansetron 4 MG tablet Commonly known as: Zofran Take 1 tablet (4 mg  total) by mouth every 8 (eight) hours as needed.   oxyCODONE 5 MG immediate release tablet Commonly known as: Roxicodone Take 1 tablet (5 mg total) by mouth every 4 (four) hours as needed for severe pain or breakthrough pain.   pantoprazole 40 MG tablet Commonly known as: PROTONIX Take 1 tablet (40 mg total) by mouth daily before breakfast.   PROBIOTIC DAILY PO Take 1 each by mouth daily.        Keep stools soft and regular. Take colace 100 mg twice daily and some miralax for the next few days. Sitz baths as needed and after Bms. Roxicodone for pain. Control to hold on any NSAID (ibuprofen, aleve until after 12/07/2020). Go to the ED with more large clots or bleeding. Some minor bleeding is normal and expected. Blood mixed in the stool is normal and expected. [phone] will get you the operator and ask to  speak with Dr. Henreitta Leber with questions or concerns.   Signed: [name] 12/03/2020, 12:23 PM

## 2020-12-03 NOTE — Plan of Care (Signed)
  Problem: Education: Goal: Knowledge of General Education information will improve Description: Including pain rating scale, medication(s)/side effects and non-pharmacologic comfort measures 12/03/2020 0341 by Charna Elizabeth, RN Outcome: Progressing 12/02/2020 2329 by Charna Elizabeth, RN Outcome: Progressing   Problem: Health Behavior/Discharge Planning: Goal: Ability to manage health-related needs will improve 12/03/2020 0341 by Charna Elizabeth, RN Outcome: Progressing 12/02/2020 2329 by Charna Elizabeth, RN Outcome: Progressing   Problem: Activity: Goal: Risk for activity intolerance will decrease 12/03/2020 0341 by Charna Elizabeth, RN Outcome: Progressing 12/02/2020 2329 by Charna Elizabeth, RN Outcome: Progressing   Problem: Nutrition: Goal: Adequate nutrition will be maintained 12/03/2020 0341 by Charna Elizabeth, RN Outcome: Progressing 12/02/2020 2329 by Charna Elizabeth, RN Outcome: Progressing   Problem: Coping: Goal: Level of anxiety will decrease 12/03/2020 0341 by Charna Elizabeth, RN Outcome: Progressing 12/02/2020 2329 by Charna Elizabeth, RN Outcome: Progressing   Problem: Pain Managment: Goal: General experience of comfort will improve 12/03/2020 0341 by Charna Elizabeth, RN Outcome: Progressing 12/02/2020 2329 by Charna Elizabeth, RN Outcome: Progressing   Problem: Safety: Goal: Ability to remain free from injury will improve 12/03/2020 0341 by Charna Elizabeth, RN Outcome: Progressing 12/02/2020 2329 by Charna Elizabeth, RN Outcome: Progressing   Problem: Skin Integrity: Goal: Risk for impaired skin integrity will decrease 12/03/2020 0341 by Charna Elizabeth, RN Outcome: Progressing 12/02/2020 2329 by Charna Elizabeth, RN Outcome: Progressing

## 2020-12-03 NOTE — TOC Transition Note (Signed)
Transition of Care [facility]) - CM/SW Discharge Note   Patient Details  Name: Kimberly Norris MRN: [id number] Date of Birth: [birth year]  Transition of Care [facility]) CM/SW Contact:  Villa Herb, LCSWA Phone Number: 12/03/2020, 9:49 AM   Clinical Narrative:    [place] consulted for medication assistance needs. CSW spoke with pt to inform her that she does not qualify for the [facility] program as she has Medicaid. CSW updated pt that using GoodRx may be beneficial if it is needed. CSW updated pts MD of this. TOC signing off.   Expected Discharge Plan: Home/Self Care Barriers to Discharge: No Barriers Identified   Patient Goals and CMS Choice Patient states their goals for this hospitalization and ongoing recovery are:: Return home CMS Medicare.gov Compare Post Acute Care list provided to:: Patient Choice offered to / list presented to : Patient  Expected Discharge Plan and Services Expected Discharge Plan: Home/Self Care In-house Referral: Clinical Social Work Discharge Planning Services: CM Consult   Living arrangements for the past 2 months: Single Family Home                  Prior Living Arrangements/Services Living arrangements for the past 2 months: Single Family Home Lives with:: Self Patient language and need for interpreter reviewed:: Yes Do you feel safe going back to the place where you live?: Yes      Need for Family Participation in Patient Care: Yes (Comment) Care giver support system in place?: Yes (comment)   Criminal Activity/Legal Involvement Pertinent to Current Situation/Hospitalization: No - Comment as needed  Activities of Daily Living Home Assistive Devices/Equipment: None ADL Screening (condition at time of admission) Patient's cognitive ability adequate to safely complete daily activities?: Yes Is the patient deaf or have difficulty hearing?: No Does the patient have difficulty seeing, even when wearing glasses/contacts?: No Does the patient have difficulty  concentrating, remembering, or making decisions?: No Patient able to express need for assistance with ADLs?: Yes Does the patient have difficulty dressing or bathing?: No Independently performs ADLs?: Yes (appropriate for developmental age) Does the patient have difficulty walking or climbing stairs?: No Weakness of Legs: None Weakness of Arms/Hands: None  Permission Sought/Granted  Emotional Assessment Appearance:: Appears stated age Attitude/Demeanor/Rapport: Engaged Affect (typically observed): Accepting Orientation: : Oriented to Self, Oriented to Place, Oriented to  Time, Oriented to Situation Alcohol / Substance Use: Not Applicable Psych Involvement: No (comment)  Admission diagnosis:  Rectal bleeding [K62.5] Patient Active Problem List   Diagnosis Date Noted   Rectal bleeding 12/02/2020   Grade III hemorrhoids 11/16/2020   Family history of breast cancer in mother 11/03/2020   Nipple pain 11/03/2020   Nexplanon in place 11/03/2020   Encounter for well woman exam with routine gynecological exam 11/03/2020   Adjustment disorder with emotional disturbance    Self-injurious behavior    Dysphagia 04/29/2020   Abdominal wall bulge 04/29/2020   Alternating constipation and diarrhea 03/17/2020   RUQ abdominal pain 03/17/2020   Anal fissure 02/17/2020   Skin tag of perianal region 02/17/2020   Cold sore 12/18/2019   Screen for STD (sexually transmitted disease) 12/18/2019   Urinary frequency 09/16/2019   Vaginal itching 09/16/2019   Nausea without vomiting 08/18/2019   Gastroesophageal reflux disease 08/18/2019   Loss of weight 08/18/2019   Fatty liver 08/18/2019   Early satiety 08/18/2019   Encounter for gynecological examination with Papanicolaou smear of cervix 07/25/2018   Traumatic rhabdomyolysis (HCC) 07/12/2015   Rhabdomyolysis 07/12/2015   BMI (  body mass index), pediatric, 95-99% for age 68/16/2014   Acquired genu valgum, 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 [address] [place] [address] [place] [address] Phone: [phone] Fax: [phone]  Social Determinants of Health (SDOH) Interventions    Readmission Risk Interventions No flowsheet data found.    Final next level of care: Home/Self Care Barriers to Discharge: No Barriers Identified   Patient Goals and CMS Choice Patient states their goals for this hospitalization and ongoing recovery are:: Return home CMS Medicare.gov Compare Post Acute Care list provided to:: Patient Choice offered to / list presented to : Patient  Discharge Placement                       Discharge Plan and Services In-house Referral: Clinical Social Work Discharge Planning Services: CM Consult                                 Social Determinants of Health (SDOH) Interventions     Readmission Risk Interventions No flowsheet data found.

## 2020-12-06 ENCOUNTER — Encounter: Payer: Self-pay | Admitting: Family Medicine

## 2020-12-30 ENCOUNTER — Encounter: Payer: Self-pay | Admitting: General Surgery

## 2020-12-30 ENCOUNTER — Other Ambulatory Visit: Payer: Self-pay

## 2020-12-30 ENCOUNTER — Ambulatory Visit (INDEPENDENT_AMBULATORY_CARE_PROVIDER_SITE_OTHER): Payer: Medicaid Other | Admitting: General Surgery

## 2020-12-30 VITALS — BP 114/72 | HR 88 | Temp 97.6°F | Resp 16 | Ht 61.0 in | Wt 196.0 lb

## 2020-12-30 DIAGNOSIS — K642 Third degree hemorrhoids: Secondary | ICD-10-CM

## 2020-12-31 NOTE — Progress Notes (Signed)
[facility]  Doing well. Only minor soreness now. Did have pain after surgery but improved. Having regular Bms.   BP 114/72    Pulse 88    Temp 97.6 F (36.4 C) (Other (Comment))    Resp 16    Ht 5\' 1"  (1.549 m)    Wt 196 lb (88.9 kg)    SpO2 98%    BMI 37.03 kg/m  Healing area of hemorrhoidectomy, some swelling, no signs of infection  Patient s/p hemorrhoidectomy. Doing well.  Keep stools soft and regular Call with issues PRN follow up  , MD [facility] [address] [place], [place] [phone] (office)

## 2021-01-19 ENCOUNTER — Ambulatory Visit
Admission: EM | Admit: 2021-01-19 | Discharge: 2021-01-19 | Disposition: A | Discharge status: Home / Self Care | Payer: Medicaid Other | Attending: Family Medicine | Admitting: Family Medicine

## 2021-01-19 ENCOUNTER — Other Ambulatory Visit: Payer: Self-pay

## 2021-01-19 DIAGNOSIS — J3089 Other allergic rhinitis: Secondary | ICD-10-CM

## 2021-01-19 DIAGNOSIS — R300 Dysuria: Secondary | ICD-10-CM | POA: Diagnosis not present

## 2021-01-19 DIAGNOSIS — R350 Frequency of micturition: Secondary | ICD-10-CM | POA: Diagnosis not present

## 2021-01-19 LAB — POCT URINALYSIS DIP (MANUAL ENTRY)
Bilirubin, UA: NEGATIVE
Blood, UA: NEGATIVE
Glucose, UA: NEGATIVE mg/dL
Ketones, POC UA: NEGATIVE mg/dL
Nitrite, UA: NEGATIVE
Protein Ur, POC: NEGATIVE mg/dL
Spec Grav, UA: 1.02 (ref 1.010–1.025)
Urobilinogen, UA: 0.2 E.U./dL
pH, UA: 7 (ref 5.0–8.0)

## 2021-01-19 MED ORDER — CETIRIZINE HCL 10 MG PO TABS: 10.0000 mg | ORAL_TABLET | Freq: Every day | ORAL | 2 refills | Status: DC

## 2021-01-19 MED ORDER — FLUTICASONE PROPIONATE 50 MCG/ACT NA SUSP
1.0000 | Freq: Two times a day (BID) | NASAL | 2 refills | Status: DC
Start: 2021-01-19 — End: 2021-01-31

## 2021-01-19 MED ORDER — NITROFURANTOIN MONOHYD MACRO 100 MG PO CAPS
100.0000 mg | ORAL_CAPSULE | Freq: Two times a day (BID) | ORAL | 0 refills | Status: DC
Start: 2021-01-19 — End: 2021-01-31

## 2021-01-19 NOTE — ED Triage Notes (Addendum)
Patient states that she thinks she has a UTI or yeast infection for the past few days.   Patient states she would like to be tested fior STD as well.  Patient states she is urinating more and it is burning when she urinates.  Patient states she would like to be seen for the congestion she has been having for a few days.   Denies Fever

## 2021-01-19 NOTE — ED Provider Notes (Signed)
[facility]    CSN: [id number] Arrival date & time: 01/19/21  1442      History   Chief Complaint Chief Complaint  Patient presents with   Urinary Tract Infection    HPI VIRGIN GREISEN is a 26 y.o. female.   Patient presenting today with 2 to 3-day history of dysuria, urinary frequency.  Denies vaginal discharge, itching, irritation, rashes.  Does have a fairly new sexual partner and wanting to be screened for STDs as well.  Has Nexplanon for contraception and states she has no concern for pregnancy today.  She is also been having several days of runny nose, sinus pressure, sneezing, watery eyes.  She does have a history of seasonal allergies not currently on any medications for this.  Denies fever, chills, body aches, sore throat, cough.  No new sick contacts recently.   Past Medical History:  Diagnosis Date   Bilateral chronic knee pain 04/16/2012   GERD (gastroesophageal reflux disease)    Heartburn    Liver disease    fatty liver   Mental disorder    anxiety, depression   Rhabdomyolysis     Patient Active Problem List   Diagnosis Date Noted   Rectal bleeding 12/02/2020   Grade III hemorrhoids 11/16/2020   Family history of breast cancer in mother 11/03/2020   Nipple pain 11/03/2020   Nexplanon in place 11/03/2020   Encounter for well woman exam with routine gynecological exam 11/03/2020   Adjustment disorder with emotional disturbance    Self-injurious behavior    Dysphagia 04/29/2020   Abdominal wall bulge 04/29/2020   Alternating constipation and diarrhea 03/17/2020   RUQ abdominal pain 03/17/2020   Anal fissure 02/17/2020   Skin tag of perianal region 02/17/2020   Cold sore 12/18/2019   Screen for STD (sexually transmitted disease) 12/18/2019   Urinary frequency 09/16/2019   Vaginal itching 09/16/2019   Nausea without vomiting 08/18/2019   Gastroesophageal reflux disease 08/18/2019   Loss of weight 08/18/2019   Fatty liver 08/18/2019    Early satiety 08/18/2019   Encounter for gynecological examination with Papanicolaou smear of cervix 07/25/2018   Traumatic rhabdomyolysis ([place]) 07/12/2015   Rhabdomyolysis 07/12/2015   BMI (body mass index), pediatric, 95-99% for age 40/16/2014   Acquired genu valgum, 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  All pathology was benign.    HEMORRHOID SURGERY N/A 12/01/2020   Procedure: HEMORRHOIDECTOMY; SIMPLE;  Surgeon: Virl Cagey, MD;  Location: [facility];  Service: General;  Laterality: N/A;   NO PAST SURGERIES      OB History     Gravida  0   Para  0   Term  0   Preterm  0   AB  0   Living  0      SAB  0   IAB  0   Ectopic  0   Multiple  0   Live Births  0            Home Medications    Prior to Admission medications   Medication  Sig Start Date End Date Taking? Authorizing Provider  acetaminophen (TYLENOL) 500 MG tablet Take 500 mg by mouth every 6 (six) hours as needed.    [provider]  calcium carbonate (TUMS - DOSED IN MG ELEMENTAL CALCIUM) 500 MG chewable tablet Chew 1 tablet by mouth daily as needed for heartburn. As needed    [provider]  cetirizine (ZYRTEC ALLERGY) 10 MG tablet Take 1 tablet (10 mg total) by mouth daily. 01/19/21  Yes Volney American, PA-C  etonogestrel (NEXPLANON) 68 MG IMPL implant Inject 68 mg into the skin once.     [provider]  fluticasone (FLONASE) 50 MCG/ACT nasal spray Place 1 spray into both  nostrils 2 (two) times daily. 01/19/21  Yes Volney American, PA-C  Metamucil Fiber CHEW Chew 2 each by mouth daily.    [provider]  nitrofurantoin, macrocrystal-monohydrate, (MACROBID) 100 MG capsule Take 1 capsule (100 mg total) by mouth 2 (two) times daily. 01/19/21  Yes Volney American, PA-C  pantoprazole (PROTONIX) 40 MG tablet Take 1 tablet (40 mg total) by mouth daily before breakfast. 09/01/20   Erenest Rasher, PA-C  Probiotic Product (PROBIOTIC DAILY PO) Take 1 each by mouth daily.    [provider]    Family History Family History  Problem Relation Age of Onset   Breast cancer Mother 48   Autism Sister        1 sister    Social History Social History   Tobacco Use   Smoking status: Every Day    Packs/day: 0.50    Years: 5.00    Pack years: 2.50    Types: Cigarettes   Smokeless tobacco: Never   Tobacco comments:    smokes 1-2 cig daily  Vaping Use   Vaping Use: Some days   Substances: Nicotine  Substance Use Topics   Alcohol use: Yes    Comment: rarely   Drug use: No     Allergies   Other, Flagyl [metronidazole], and Tinidazole   Review of Systems Review of Systems Per HPI  Physical Exam Triage Vital Signs ED Triage Vitals  Enc Vitals Group     BP 01/19/21 1529 116/73     Pulse Rate 01/19/21 1529 80     Resp 01/19/21 1529 16     Temp 01/19/21 1529 98.7 F (37.1 C)     Temp Source 01/19/21 1529 Oral     SpO2 01/19/21 1529 97 %     Weight --      Height --      Head Circumference --      Peak Flow --      Pain Score 01/19/21 1525 6     Pain Loc --      Pain Edu? --      Excl. in [place]? --    No data found.  Updated Vital Signs BP 116/73 (BP Location: Right Arm)    Pulse 80    Temp 98.7 F (37.1 C) (Oral)    Resp 16    SpO2 97%   Visual Acuity Right Eye Distance:   Left Eye Distance:   Bilateral Distance:    Right Eye Near:   Left Eye Near:    Bilateral Near:     Physical Exam Vitals and nursing note  reviewed.  Constitutional:      Appearance: Normal appearance. She is not ill-appearing.  HENT:     Head: Atraumatic.     Right Ear: Tympanic membrane  normal.     Left Ear: Tympanic membrane normal.     Nose: Rhinorrhea present.     Mouth/Throat:     Mouth: Mucous membranes are moist.  Eyes:     Extraocular Movements: Extraocular movements intact.     Conjunctiva/sclera: Conjunctivae normal.  Cardiovascular:     Rate and Rhythm: Normal rate and regular rhythm.     Heart sounds: Normal heart sounds.  Pulmonary:     Effort: Pulmonary effort is normal.     Breath sounds: Normal breath sounds. No wheezing or rales.  Abdominal:     General: Bowel sounds are normal. There is no distension.     Palpations: Abdomen is soft.     Tenderness: There is no abdominal tenderness. There is no guarding.  Genitourinary:    Comments: GU exam deferred, self swab performed Musculoskeletal:        General: Normal range of motion.     Cervical back: Normal range of motion and neck supple.  Skin:    General: Skin is warm and dry.  Neurological:     Mental Status: She is alert and oriented to person, place, and time.  Psychiatric:        Mood and Affect: Mood normal.        Thought Content: Thought content normal.        Judgment: Judgment normal.     UC Treatments / Results  Labs (all labs ordered are listed, but only abnormal results are displayed) Labs Reviewed  POCT URINALYSIS DIP (MANUAL ENTRY) - Abnormal; Notable for the following components:      Result Value   Color, UA light yellow (*)    Leukocytes, UA Moderate (2+) (*)    All other components within normal limits  URINE CULTURE  CERVICOVAGINAL ANCILLARY ONLY    EKG   Radiology No results found.  Procedures Procedures (including critical care time)  Medications Ordered in UC Medications - No data to display  Initial Impression / Assessment and Plan / UC Course  I have reviewed the triage vital signs and the nursing  notes.  Pertinent labs & imaging results that were available during my care of the patient were reviewed by me and considered in my medical decision making (see chart for details).     Vital signs benign and reassuring, UA showing 2+ leukocytes.  Urine culture pending, will treat for a urinary tract infection while awaiting this and vaginal swab.  Adjust as needed based on these results.  Discussed abstinence and supportive over-the-counter medications and home care in the meantime.  Symptoms consistent with seasonal allergies regarding her upper respiratory symptoms, will restart allergy regimen with Zyrtec and Flonase and monitor for symptomatic improvement.  Return for acutely worsening symptoms.  Final Clinical Impressions(s) / UC Diagnoses   Final diagnoses:  Urinary frequency  Dysuria  Seasonal allergic rhinitis due to other allergic trigger   Discharge Instructions   None    ED Prescriptions     Medication Sig Dispense Auth. Provider   nitrofurantoin, macrocrystal-monohydrate, (MACROBID) 100 MG capsule Take 1 capsule (100 mg total) by mouth 2 (two) times daily. 10 capsule Volney American, PA-C   cetirizine (ZYRTEC ALLERGY) 10 MG tablet Take 1 tablet (10 mg total) by mouth daily. 30 tablet Volney American, PA-C   fluticasone [facility]) 50 MCG/ACT nasal spray Place 1 spray into both nostrils 2 (two) times daily. 16 g Volney American, [place]      PDMP not reviewed this  encounter.   Volney American, [place] 01/19/21 1652

## 2021-01-20 LAB — CERVICOVAGINAL ANCILLARY ONLY
Bacterial Vaginitis (gardnerella): NEGATIVE
Candida Glabrata: NEGATIVE
Candida Vaginitis: POSITIVE — AB
Chlamydia: NEGATIVE
Comment: NEGATIVE
Comment: NEGATIVE
Comment: NEGATIVE
Comment: NEGATIVE
Comment: NEGATIVE
Comment: NORMAL
Neisseria Gonorrhea: NEGATIVE
Trichomonas: NEGATIVE

## 2021-01-21 ENCOUNTER — Telehealth (HOSPITAL_COMMUNITY): Payer: Self-pay | Admitting: Emergency Medicine

## 2021-01-21 LAB — URINE CULTURE: Culture: 60000 — AB

## 2021-01-21 MED ORDER — FLUCONAZOLE 150 MG PO TABS: 150.0000 mg | ORAL_TABLET | Freq: Once | ORAL | 0 refills | Status: AC

## 2021-01-24 ENCOUNTER — Ambulatory Visit: Payer: Medicaid Other | Admitting: Adult Health

## 2021-01-31 ENCOUNTER — Other Ambulatory Visit: Payer: Self-pay

## 2021-01-31 ENCOUNTER — Other Ambulatory Visit (HOSPITAL_COMMUNITY)
Admission: RE | Admit: 2021-01-31 | Discharge: 2021-01-31 | Disposition: A | Discharge status: Home / Self Care | Payer: Medicaid Other | Source: Ambulatory Visit | Attending: Adult Health | Admitting: Adult Health

## 2021-01-31 ENCOUNTER — Encounter: Payer: Self-pay | Admitting: Adult Health

## 2021-01-31 ENCOUNTER — Ambulatory Visit: Payer: Medicaid Other | Admitting: Adult Health

## 2021-01-31 VITALS — BP 123/76 | HR 79 | Ht 61.0 in | Wt 194.2 lb

## 2021-01-31 DIAGNOSIS — N898 Other specified noninflammatory disorders of vagina: Secondary | ICD-10-CM | POA: Diagnosis present

## 2021-01-31 DIAGNOSIS — Z113 Encounter for screening for infections with a predominantly sexual mode of transmission: Secondary | ICD-10-CM | POA: Insufficient documentation

## 2021-01-31 NOTE — Progress Notes (Signed)
°  Subjective:     Patient ID: Kimberly Norris, female   DOB: 01/07/1996, 26 y.o.   MRN: [id number]  HPI Kimberly Norris is a 26 year old white female,single, G0P0 in complaining of vaginal itching and urine has odor, was seen at Urgent Care and treated for yeast.  Lab Results  Component Value Date   DIAGPAP  07/25/2018    NEGATIVE FOR INTRAEPITHELIAL LESIONS OR MALIGNANCY.   PCP is Dr Dahlia Client  Review of Systems +vaginal itching Has had oral sex and he has beard  Reviewed past medical,surgical, social and family history. Reviewed medications and allergies.     Objective:   Physical Exam BP 123/76 (BP Location: Right Arm, Patient Position: Sitting, Cuff Size: Normal)    Pulse 79    Ht 5\' 1"  (1.549 m)    Wt 194 lb 3.2 oz (88.1 kg)    BMI 36.69 kg/m     Skin warm and dry.Pelvic: external genitalia is normal in appearance no lesions, vagina: white discharge with odor,urethra has no lesions or masses noted, cervix:smooth and bulbous, uterus: normal size, shape and contour, non tender, no masses felt, adnexa: no masses or tenderness noted. Bladder is non tender and no masses felt.  [place] - 01/31/21 1554       Pregnancy Intention Screening   Does the patient want to become pregnant in the next year? No    Does the patient's partner want to become pregnant in the next year? No    Would the patient like to discuss contraceptive options today? No      Contraception Wrap Up   Current Method Hormonal Implant    End Method Hormonal Implant    Contraception Counseling Provided No            Examination chaperoned by 02/02/21 LPN  Assessment:     1. Vaginal itching CV swab sent For GC/CHL,trich,BV and yeast  - Cervicovaginal ancillary only( [place])  2. Screening examination for STD (sexually transmitted disease) CV swab sent - Cervicovaginal ancillary only( [place])     Plan:     Follow up prn

## 2021-02-02 ENCOUNTER — Other Ambulatory Visit: Payer: Self-pay | Admitting: Adult Health

## 2021-02-02 LAB — CERVICOVAGINAL ANCILLARY ONLY
Bacterial Vaginitis (gardnerella): NEGATIVE
Candida Glabrata: NEGATIVE
Candida Vaginitis: POSITIVE — AB
Chlamydia: NEGATIVE
Comment: NEGATIVE
Comment: NEGATIVE
Comment: NEGATIVE
Comment: NEGATIVE
Comment: NEGATIVE
Comment: NORMAL
Neisseria Gonorrhea: NEGATIVE
Trichomonas: NEGATIVE

## 2021-02-02 MED ORDER — TERCONAZOLE 0.4 % VA CREA: 1.0000 | TOPICAL_CREAM | Freq: Every day | VAGINAL | 0 refills | Status: DC

## 2021-02-02 NOTE — Progress Notes (Signed)
+  yeast on vaginal swab will rx Terazol 7 cream

## 2021-02-04 ENCOUNTER — Telehealth: Payer: Self-pay | Admitting: Adult Health

## 2021-02-04 MED ORDER — FLUCONAZOLE 150 MG PO TABS: ORAL_TABLET | ORAL | 1 refills | Status: DC

## 2021-02-04 NOTE — Telephone Encounter (Signed)
Patient calling stating that the medline that was sent in for terazol states that insurance will not cover and unable to afford even with the good rx she states unable to pay wanting to know if there is anything else that can be sent.

## 2021-02-04 NOTE — Addendum Note (Signed)
Addended by: Cyril Mourning A on: 02/04/2021 02:05 PM   Modules accepted: Orders

## 2021-02-04 NOTE — Telephone Encounter (Signed)
Pt aware that I sent in rx for diflucan

## 2021-02-10 ENCOUNTER — Encounter: Payer: Self-pay | Admitting: Internal Medicine

## 2021-04-13 ENCOUNTER — Ambulatory Visit
Admission: EM | Admit: 2021-04-13 | Discharge: 2021-04-13 | Disposition: A | Discharge status: Home / Self Care | Payer: Medicaid Other | Attending: Family Medicine | Admitting: Family Medicine

## 2021-04-13 DIAGNOSIS — R8290 Unspecified abnormal findings in urine: Secondary | ICD-10-CM

## 2021-04-13 DIAGNOSIS — H65192 Other acute nonsuppurative otitis media, left ear: Secondary | ICD-10-CM

## 2021-04-13 LAB — POCT URINALYSIS DIP (MANUAL ENTRY)
Bilirubin, UA: NEGATIVE
Blood, UA: NEGATIVE
Glucose, UA: NEGATIVE mg/dL
Ketones, POC UA: NEGATIVE mg/dL
Leukocytes, UA: NEGATIVE
Nitrite, UA: NEGATIVE
Protein Ur, POC: NEGATIVE mg/dL
Spec Grav, UA: >=1.03 — AB (ref 1.010–1.025)
Urobilinogen, UA: 0.2 E.U./dL
pH, UA: 6 (ref 5.0–8.0)

## 2021-04-13 MED ORDER — PREDNISONE 50 MG PO TABS: ORAL_TABLET | ORAL | 0 refills | Status: DC

## 2021-04-13 NOTE — ED Triage Notes (Signed)
Pt states that her UTI symptoms started a few days ago ? ?Pt states her urine has a foul smell and frequent urination ? ?Denies Fever ? ?Denies Meds ?

## 2021-04-13 NOTE — ED Provider Notes (Signed)
?[place] ? ? ? ?CSN: [id number] ?Arrival date & time: 04/13/21  1738 ? ? ?  ? ?History   ?Chief Complaint ?Chief Complaint  ?Patient presents with  ? UTI symptoms  ? ? ?HPI ?Kimberly Norris is a 26 y.o. female.  ? ?Presenting today with 2-day history of foul smell to her urine, frequent urination.  Denies vaginal discharge, itching, irritation, abdominal pain, fever, chills, concern for STIs or pregnancy.  So far not try anything over-the-counter for symptoms.  Also states she has been having left ear pain worsening over the past few days.  Mild muffled hearing and pressure.  States she has bad seasonal allergies that have been under poor control recently.  Trying Sudafed, Flonase with minimal relief. ? ? ?Past Medical History:  ?Diagnosis Date  ? Bilateral chronic knee pain 04/16/2012  ? GERD (gastroesophageal reflux disease)   ? Heartburn   ? Liver disease   ? fatty liver  ? Mental disorder   ? anxiety, depression  ? Rhabdomyolysis   ? ? ?Patient Active Problem List  ? Diagnosis Date Noted  ? Screening examination for STD (sexually transmitted disease) 01/31/2021  ? Rectal bleeding 12/02/2020  ? Grade III hemorrhoids 11/16/2020  ? Family history of breast cancer in mother 11/03/2020  ? Nipple pain 11/03/2020  ? Nexplanon in place 11/03/2020  ? Encounter for well woman exam with routine gynecological exam 11/03/2020  ? Adjustment disorder with emotional disturbance   ? Self-injurious behavior   ? Dysphagia 04/29/2020  ? Abdominal wall bulge 04/29/2020  ? Alternating constipation and diarrhea 03/17/2020  ? RUQ abdominal pain 03/17/2020  ? Anal fissure 02/17/2020  ? Skin tag of perianal region 02/17/2020  ? Cold sore 12/18/2019  ? Screen for STD (sexually transmitted disease) 12/18/2019  ? Urinary frequency 09/16/2019  ? Vaginal itching 09/16/2019  ? Nausea without vomiting 08/18/2019  ? Gastroesophageal reflux disease 08/18/2019  ? Loss of weight 08/18/2019  ? Fatty liver 08/18/2019  ? Early satiety  08/18/2019  ? Encounter for gynecological examination with Papanicolaou smear of cervix 07/25/2018  ? Traumatic rhabdomyolysis ([place]) 07/12/2015  ? Rhabdomyolysis 07/12/2015  ? BMI (body mass index), pediatric, 95-99% for age 62/16/2014  ? 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 Bilateral chronic knee pain 04/16/2012  ? ? ?Past Surgical History:  ?Procedure Laterality Date  ? BIOPSY  09/11/2019  ? Procedure: BIOPSY;  Surgeon: Eloise Harman, DO;  Location: [facility];  Service: Endoscopy;;  ? ESOPHAGOGASTRODUODENOSCOPY (EGD) WITH PROPOFOL N/A 09/11/2019  ? Procedure: ESOPHAGOGASTRODUODENOSCOPY (EGD) WITH PROPOFOL;  Surgeon: Eloise Harman, DO;  Esophageal mucosal changes suspicious for eosinophilic esophagitis s/p biopsy, gastritis s/p biopsy, normal examined duodenum biopsy.  All pathology was benign.   ? HEMORRHOID SURGERY N/A 12/01/2020  ? Procedure: HEMORRHOIDECTOMY; SIMPLE;  Surgeon: Virl Cagey, MD;  Location: [facility];  Service: General;  Laterality: N/A;  ? NO PAST SURGERIES    ? ? ?OB History   ? ? Gravida  ?0  ? Para  ?0  ? Term  ?0  ? Preterm  ?0  ? AB  ?0  ? Living  ?0  ?  ? ? SAB  ?0  ? IAB  ?0  ? Ectopic  ?0  ? Multiple  ?0  ? Live Births  ?0  ?   ?  ?  ? ? ? ?Home Medications   ? ?Prior to Admission medications   ?Medication Sig Start Date End Date Taking? Authorizing Provider  ?  predniSONE (DELTASONE) 50 MG tablet Take 1 tab with breakfast daily 04/13/21  Yes Volney American, PA-C  ?calcium carbonate (TUMS - DOSED IN MG ELEMENTAL CALCIUM) 500 MG chewable tablet Chew 1 tablet by mouth daily as needed for heartburn. As needed    [provider]  ?etonogestrel (NEXPLANON) 68 MG IMPL implant Inject 68 mg into the skin once.     [provider]  ?fluconazole (DIFLUCAN) 150 MG tablet Take 1 now and 1 in 3 days 02/04/21   Estill Dooms, NP  ?Metamucil Fiber CHEW Chew 2 each by mouth daily.    [provider]  ?pantoprazole (PROTONIX) 40 MG tablet Take  1 tablet (40 mg total) by mouth daily before breakfast. 09/01/20   Erenest Rasher, PA-C  ?Probiotic Product (PROBIOTIC DAILY PO) Take 1 each by mouth daily.    [provider]  ? ? ?Family History ?Family History  ?Problem Relation Age of Onset  ? Breast cancer Mother 55  ? Autism Sister   ?     1 sister  ? ? ?Social History ?Social History  ? ?Tobacco Use  ? Smoking status: Every Day  ?  Packs/day: 0.50  ?  Years: 5.00  ?  Pack years: 2.50  ?  Types: Cigarettes  ? Smokeless tobacco: Never  ? Tobacco comments:  ?  smokes 1-2 cig daily  ?Vaping Use  ? Vaping Use: Never used  ?Substance Use Topics  ? Alcohol use: Yes  ?  Comment: rarely  ? Drug use: No  ? ? ? ?Allergies   ?Other, Flagyl [metronidazole], and Tinidazole ? ? ?Review of Systems ?Review of Systems ?Per HPI ? ?Physical Exam ?Triage Vital Signs ?ED Triage Vitals  ?Enc Vitals Group  ?   BP 04/13/21 1800 120/78  ?   Pulse Rate 04/13/21 1800 80  ?   Resp 04/13/21 1800 18  ?   Temp 04/13/21 1800 98.4 ?F (36.9 ?C)  ?   Temp Source 04/13/21 1800 Oral  ?   SpO2 04/13/21 1800 96 %  ?   Weight --   ?   Height --   ?   Head Circumference --   ?   Peak Flow --   ?   Pain Score 04/13/21 1801 0  ?   Pain Loc --   ?   Pain Edu? --   ?   Excl. in [place]? --   ? ?No data found. ? ?Updated Vital Signs ?BP 120/78 (BP Location: Right Arm)   Pulse 80   Temp 98.4 ?F (36.9 ?C) (Oral)   Resp 18   SpO2 96%  ? ?Visual Acuity ?Right Eye Distance:   ?Left Eye Distance:   ?Bilateral Distance:   ? ?Right Eye Near:   ?Left Eye Near:    ?Bilateral Near:    ? ?Physical Exam ?Vitals and nursing note reviewed.  ?Constitutional:   ?   Appearance: Normal appearance. She is not ill-appearing.  ?HENT:  ?   Head: Atraumatic.  ?   Ears:  ?   Comments: Left middle ear effusion ?Eyes:  ?   Extraocular Movements: Extraocular movements intact.  ?   Conjunctiva/sclera: Conjunctivae normal.  ?Cardiovascular:  ?   Rate and Rhythm: Normal rate and regular rhythm.  ?   Heart sounds: Normal heart  sounds.  ?Pulmonary:  ?   Effort: Pulmonary effort is normal.  ?   Breath sounds: Normal breath sounds.  ?Abdominal:  ?   General:  Bowel sounds are normal. There is no distension.  ?   Palpations: Abdomen is soft.  ?   Tenderness: There is no abdominal tenderness. There is no right CVA tenderness, left CVA tenderness or guarding.  ?Genitourinary: ?   Comments: GU exam deferred, self swab performed ?Musculoskeletal:     ?   General: Normal range of motion.  ?   Cervical back: Normal range of motion and neck supple.  ?Skin: ?   General: Skin is warm and dry.  ?Neurological:  ?   Mental Status: She is alert and oriented to person, place, and time.  ?Psychiatric:     ?   Mood and Affect: Mood normal.     ?   Thought Content: Thought content normal.     ?   Judgment: Judgment normal.  ? ? ? ?UC Treatments / Results  ?Labs ?(all labs ordered are listed, but only abnormal results are displayed) ?Labs Reviewed  ?POCT URINALYSIS DIP (MANUAL ENTRY) - Abnormal; Notable for the following components:  ?    Result Value  ? Spec Grav, UA >=1.030 (*)   ? All other components within normal limits  ?CYTOLOGY, (ORAL, ANAL, URETHRAL) ANCILLARY ONLY  ? ? ?EKG ? ? ?Radiology ?No results found. ? ?Procedures ?Procedures (including critical care time) ? ?Medications Ordered in UC ?Medications - No data to display ? ?Initial Impression / Assessment and Plan / UC Course  ?I have reviewed the triage vital signs and the nursing notes. ? ?Pertinent labs & imaging results that were available during my care of the patient were reviewed by me and considered in my medical decision making (see chart for details). ? ?  ? ? ?Vitals and exam reassuring today, will treat with a short burst of prednisone in addition to Flonase, Sudafed for middle ear effusion and will await vaginal swab and treat as needed.  Push fluids, boric acid suppositories recommended.  UA negative for urinary tract infection. ? ?Final Clinical Impressions(s) / UC Diagnoses   ? ?Final diagnoses:  ?Abnormal urine odor  ?Acute middle ear effusion, left  ? ?Discharge Instructions   ?None ?  ? ?ED Prescriptions   ? ? Medication Sig Dispense Auth. Provider  ? predniSONE (DELTASONE) 50 MG

## 2021-04-14 LAB — CYTOLOGY, (ORAL, ANAL, URETHRAL) ANCILLARY ONLY
Chlamydia: NEGATIVE
Comment: NEGATIVE
Comment: NEGATIVE
Comment: NORMAL
Neisseria Gonorrhea: NEGATIVE
Trichomonas: NEGATIVE

## 2021-04-22 ENCOUNTER — Ambulatory Visit
Admission: EM | Admit: 2021-04-22 | Discharge: 2021-04-22 | Disposition: A | Discharge status: Home / Self Care | Payer: Medicaid Other | Attending: Family Medicine | Admitting: Family Medicine

## 2021-04-22 DIAGNOSIS — N898 Other specified noninflammatory disorders of vagina: Secondary | ICD-10-CM | POA: Insufficient documentation

## 2021-04-22 DIAGNOSIS — R600 Localized edema: Secondary | ICD-10-CM | POA: Diagnosis present

## 2021-04-22 MED ORDER — METHYLPREDNISOLONE SODIUM SUCC 125 MG IJ SOLR
60.0000 mg | Freq: Once | INTRAMUSCULAR | Status: AC
  Administered 2021-04-22: 60 mg via INTRAMUSCULAR

## 2021-04-22 NOTE — ED Triage Notes (Signed)
Pt states she went to the dentist yesterday and they gave her novocain and today her face is swollen ? ?Pt states she did not get any pain meds from the dentist ? ?Pt states she would also like to be checked for yeast today because her symptoms have not changed since she was here last week ? ? ?

## 2021-04-22 NOTE — ED Provider Notes (Signed)
?[place] ? ? ? ?CSN: [id number] ?Arrival date & time: 04/22/21  1149 ? ? ?  ? ?History   ?Chief Complaint ?Chief Complaint  ?Patient presents with  ? Facial Swelling  ? ? ?HPI ?Kimberly Norris is a 26 y.o. female.  ? ?Presenting today with 1 day history of left-sided facial swelling after a dental procedure yesterday where she was given a Novocain injection to fill the cavity.  She states she has had this done in the past and has never had an issue.  Denies pain, drainage, bleeding, fevers, chills, throat itching or swelling, difficulty breathing or swallowing.  Not trying anything for symptoms.  She would also like to be rechecked for a yeast infection today as she is still having vaginal odor and irritation.  Vaginal swab last week was negative for all components.  Not trying anything over-the-counter for symptoms.  No new sexual partners.  Has an implant for birth control. ? ? ?Past Medical History:  ?Diagnosis Date  ? Bilateral chronic knee pain 04/16/2012  ? GERD (gastroesophageal reflux disease)   ? Heartburn   ? Liver disease   ? fatty liver  ? Mental disorder   ? anxiety, depression  ? Rhabdomyolysis   ? ? ?Patient Active Problem List  ? Diagnosis Date Noted  ? Screening examination for STD (sexually transmitted disease) 01/31/2021  ? Rectal bleeding 12/02/2020  ? Grade III hemorrhoids 11/16/2020  ? Family history of breast cancer in mother 11/03/2020  ? Nipple pain 11/03/2020  ? Nexplanon in place 11/03/2020  ? Encounter for well woman exam with routine gynecological exam 11/03/2020  ? Adjustment disorder with emotional disturbance   ? Self-injurious behavior   ? Dysphagia 04/29/2020  ? Abdominal wall bulge 04/29/2020  ? Alternating constipation and diarrhea 03/17/2020  ? RUQ abdominal pain 03/17/2020  ? Anal fissure 02/17/2020  ? Skin tag of perianal region 02/17/2020  ? Cold sore 12/18/2019  ? Screen for STD (sexually transmitted disease) 12/18/2019  ? Urinary frequency 09/16/2019  ? Vaginal  itching 09/16/2019  ? Nausea without vomiting 08/18/2019  ? Gastroesophageal reflux disease 08/18/2019  ? Loss of weight 08/18/2019  ? Fatty liver 08/18/2019  ? Early satiety 08/18/2019  ? Encounter for gynecological examination with Papanicolaou smear of cervix 07/25/2018  ? Traumatic rhabdomyolysis ([place]) 07/12/2015  ? Rhabdomyolysis 07/12/2015  ? BMI (body mass index), pediatric, 95-99% for age 50/16/2014  ? 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 Bilateral chronic knee pain 04/16/2012  ? ? ?Past Surgical History:  ?Procedure Laterality Date  ? BIOPSY  09/11/2019  ? Procedure: BIOPSY;  Surgeon: Eloise Harman, DO;  [facility]: [facility];  Service: Endoscopy;;  ? ESOPHAGOGASTRODUODENOSCOPY (EGD) WITH PROPOFOL N/A 09/11/2019  ? Procedure: ESOPHAGOGASTRODUODENOSCOPY (EGD) WITH PROPOFOL;  Surgeon: Eloise Harman, DO;  Esophageal mucosal changes suspicious for eosinophilic esophagitis s/p biopsy, gastritis s/p biopsy, normal examined duodenum biopsy.  All pathology was benign.   ? HEMORRHOID SURGERY N/A 12/01/2020  ? Procedure: HEMORRHOIDECTOMY; SIMPLE;  Surgeon: Virl Cagey, MD;  Location: [facility];  Service: General;  Laterality: N/A;  ? NO PAST SURGERIES    ? ? ?OB History   ? ? Gravida  ?0  ? Para  ?0  ? Term  ?0  ? Preterm  ?0  ? AB  ?0  ? Living  ?0  ?  ? ? SAB  ?0  ? IAB  ?0  ? Ectopic  ?0  ? Multiple  ?0  ?  Live Births  ?0  ?   ?  ?  ? ? ? ?Home Medications   ? ?Prior to Admission medications   ?Medication Sig Start Date End Date Taking? Authorizing Provider  ?calcium carbonate (TUMS - DOSED IN MG ELEMENTAL CALCIUM) 500 MG chewable tablet Chew 1 tablet by mouth daily as needed for heartburn. As needed    [provider]  ?etonogestrel (NEXPLANON) 68 MG IMPL implant Inject 68 mg into the skin once.     [provider]  ?fluconazole (DIFLUCAN) 150 MG tablet Take 1 now and 1 in 3 days 02/04/21   Estill Dooms, NP  ?Metamucil Fiber CHEW Chew 2 each by mouth daily.     [provider]  ?pantoprazole (PROTONIX) 40 MG tablet Take 1 tablet (40 mg total) by mouth daily before breakfast. 09/01/20   Erenest Rasher, PA-C  ?predniSONE (DELTASONE) 50 MG tablet Take 1 tab with breakfast daily 04/13/21   Volney American, PA-C  ?Probiotic Product (PROBIOTIC DAILY PO) Take 1 each by mouth daily.    [provider]  ? ? ?Family History ?Family History  ?Problem Relation Age of Onset  ? Breast cancer Mother 14  ? Autism Sister   ?     1 sister  ? ? ?Social History ?Social History  ? ?Tobacco Use  ? Smoking status: Every Day  ?  Packs/day: 0.50  ?  Years: 5.00  ?  Pack years: 2.50  ?  Types: Cigarettes  ? Smokeless tobacco: Never  ? Tobacco comments:  ?  smokes 1-2 cig daily  ?Vaping Use  ? Vaping Use: Never used  ?Substance Use Topics  ? Alcohol use: Yes  ?  Comment: rarely  ? Drug use: No  ? ? ? ?Allergies   ?Other, Flagyl [metronidazole], and Tinidazole ? ? ?Review of Systems ?Review of Systems ?Per HPI ? ?Physical Exam ?Triage Vital Signs ?ED Triage Vitals  ?Enc Vitals Group  ?   BP 04/22/21 1238 124/82  ?   Pulse Rate 04/22/21 1238 95  ?   Resp 04/22/21 1238 18  ?   Temp 04/22/21 1238 98.7 ?F (37.1 ?C)  ?   Temp Source 04/22/21 1238 Oral  ?   SpO2 04/22/21 1238 96 %  ?   Weight --   ?   Height --   ?   Head Circumference --   ?   Peak Flow --   ?   Pain Score 04/22/21 1237 0  ?   Pain Loc --   ?   Pain Edu? --   ?   Excl. in [place]? --   ? ?No data found. ? ?Updated Vital Signs ?BP 124/82 (BP Location: Right Arm)   Pulse 95   Temp 98.7 ?F (37.1 ?C) (Oral)   Resp 18   SpO2 96%  ? ?Visual Acuity ?Right Eye Distance:   ?Left Eye Distance:   ?Bilateral Distance:   ? ?Right Eye Near:   ?Left Eye Near:    ?Bilateral Near:    ? ?Physical Exam ?Vitals and nursing note reviewed.  ?Constitutional:   ?   Appearance: Normal appearance. She is not ill-appearing.  ?HENT:  ?   Head: Atraumatic.  ?   Comments: Trace left-sided facial edema.  No tenderness to palpation, masses,  fluctuance ?   Mouth/Throat:  ?   Mouth: Mucous membranes are moist.  ?   Pharynx: No posterior oropharyngeal erythema.  ?   Comments: Normal appearance  of oropharynx, oral airway patent ?Eyes:  ?   Extraocular Movements: Extraocular movements intact.  ?   Conjunctiva/sclera: Conjunctivae normal.  ?Cardiovascular:  ?   Rate and Rhythm: Normal rate and regular rhythm.  ?   Heart sounds: Normal heart sounds.  ?Pulmonary:  ?   Effort: Pulmonary effort is normal. No respiratory distress.  ?   Breath sounds: Normal breath sounds. No wheezing or rales.  ?Genitourinary: ?   Comments: GU exam deferred, self swab performed ?Musculoskeletal:     ?   General: Normal range of motion.  ?   Cervical back: Normal range of motion and neck supple.  ?Skin: ?   General: Skin is warm and dry.  ?Neurological:  ?   Mental Status: She is alert and oriented to person, place, and time.  ?Psychiatric:     ?   Mood and Affect: Mood normal.     ?   Thought Content: Thought content normal.     ?   Judgment: Judgment normal.  ? ?UC Treatments / Results  ?Labs ?(all labs ordered are listed, but only abnormal results are displayed) ?Labs Reviewed  ?CERVICOVAGINAL ANCILLARY ONLY  ? ? ?EKG ? ? ?Radiology ?No results found. ? ?Procedures ?Procedures (including critical care time) ? ?Medications Ordered in UC ?Medications  ?methylPREDNISolone sodium succinate (SOLU-MEDROL) 125 mg/2 mL injection 60 mg (60 mg Intramuscular Given 04/22/21 1332)  ? ? ?Initial Impression / Assessment and Plan / UC Course  ?I have reviewed the triage vital signs and the nursing notes. ? ?Pertinent labs & imaging results that were available during my care of the patient were reviewed by me and considered in my medical decision making (see chart for details). ? ?  ? ?Vitals and exam overall benign and reassuring, no concerning features with her trace facial edema.  Discussed antihistamines, compresses and offered low-dose IM Solu-Medrol which she is agreeable to.  Vaginal swab  pending for recheck as symptoms are persisting and no findings on previous vaginal swab last week.  Discussed supportive over-the-counter measures and return precautions. ? ?Final Clinical Impressions(s) / UC Diagn

## 2021-04-22 NOTE — Discharge Instructions (Signed)
Try boric acid suppositories vaginally daily to help maintain healthy vaginal flora.  These are available over-the-counter and on [employer] ?

## 2021-04-25 LAB — CERVICOVAGINAL ANCILLARY ONLY
Bacterial Vaginitis (gardnerella): NEGATIVE
Candida Glabrata: NEGATIVE
Candida Vaginitis: NEGATIVE
Comment: NEGATIVE
Comment: NEGATIVE
Comment: NEGATIVE

## 2021-05-14 ENCOUNTER — Encounter (HOSPITAL_COMMUNITY): Payer: Self-pay

## 2021-05-14 ENCOUNTER — Other Ambulatory Visit: Payer: Self-pay

## 2021-05-14 ENCOUNTER — Emergency Department (HOSPITAL_COMMUNITY)
Admission: EM | Admit: 2021-05-14 | Discharge: 2021-05-15 | Disposition: A | Discharge status: Home / Self Care | Payer: Medicaid Other | Attending: Student | Admitting: Student

## 2021-05-14 DIAGNOSIS — N898 Other specified noninflammatory disorders of vagina: Secondary | ICD-10-CM | POA: Insufficient documentation

## 2021-05-14 DIAGNOSIS — R300 Dysuria: Secondary | ICD-10-CM | POA: Diagnosis present

## 2021-05-14 DIAGNOSIS — F17200 Nicotine dependence, unspecified, uncomplicated: Secondary | ICD-10-CM | POA: Diagnosis not present

## 2021-05-14 LAB — URINALYSIS, ROUTINE W REFLEX MICROSCOPIC
Bilirubin Urine: NEGATIVE
Glucose, UA: NEGATIVE mg/dL
Hgb urine dipstick: NEGATIVE
Ketones, ur: NEGATIVE mg/dL
Leukocytes,Ua: NEGATIVE
Nitrite: NEGATIVE
Protein, ur: NEGATIVE mg/dL
Specific Gravity, Urine: 1.02 (ref 1.005–1.030)
pH: 6 (ref 5.0–8.0)

## 2021-05-14 LAB — POC URINE PREG, ED: Preg Test, Ur: NEGATIVE

## 2021-05-14 MED ORDER — FLUCONAZOLE 150 MG PO TABS
150.0000 mg | ORAL_TABLET | Freq: Once | ORAL | Status: AC
  Administered 2021-05-15: 150 mg via ORAL
  Filled 2021-05-14: qty 1

## 2021-05-14 NOTE — ED Notes (Signed)
Pt also reports having possible yeast infection. ?

## 2021-05-14 NOTE — ED Triage Notes (Signed)
Pt arrived via POV from home c/o new onset abdominal pain in RLQ. Pt describes pain as sharp, stabbing, cramping pain. Pt denies any previous abdominal surgeries. Pt reports pain came on suddenly today and denies any injury.  ?

## 2021-05-14 NOTE — Discharge Instructions (Signed)
Please return to the emergency department for any worsening symptoms or if you are still symptomatic.  Otherwise follow-up with your primary care provider. ?

## 2021-05-14 NOTE — ED Provider Notes (Addendum)
?[facility] ?Provider Note ? ? ?CSN: [id number] ?Arrival date & time: 05/14/21  2129 ? ?  ? ?History ?Chief Complaint  ?Patient presents with  ? Abdominal Pain  ? ? ?Kimberly Norris is a 26 y.o. female who presents to the emergency department today with dysuria and vaginal discharge which she describes as cottage cheese has been there for a couple of days.  Patient does describe similar symptoms in the past.  Patient does state that she is having some lower abdominal pain which is typical when she has a yeast infection.  No fever or chills.  No nausea, vomiting, diarrhea.  No hematuria.  Also endorsing some urinary urgency and frequency. ? ? ?Abdominal Pain ? ?  ? ?Home Medications ?Prior to Admission medications   ?Medication Sig Start Date End Date Taking? Authorizing Provider  ?calcium carbonate (TUMS - DOSED IN MG ELEMENTAL CALCIUM) 500 MG chewable tablet Chew 1 tablet by mouth daily as needed for heartburn. As needed    [provider]  ?etonogestrel (NEXPLANON) 68 MG IMPL implant Inject 68 mg into the skin once.     [provider]  ?fluconazole (DIFLUCAN) 150 MG tablet Take 1 now and 1 in 3 days 02/04/21   Estill Dooms, NP  ?Metamucil Fiber CHEW Chew 2 each by mouth daily.    [provider]  ?pantoprazole (PROTONIX) 40 MG tablet Take 1 tablet (40 mg total) by mouth daily before breakfast. 09/01/20   Erenest Rasher, PA-C  ?predniSONE (DELTASONE) 50 MG tablet Take 1 tab with breakfast daily 04/13/21   Volney American, PA-C  ?Probiotic Product (PROBIOTIC DAILY PO) Take 1 each by mouth daily.    [provider]  ?   ? ?Allergies    ?Other, Flagyl [metronidazole], and Tinidazole   ? ?Review of Systems   ?Review of Systems  ?Gastrointestinal:  Positive for abdominal pain.  ?All other systems reviewed and are negative. ? ?Physical Exam ?Updated Vital Signs ?BP 127/90 (BP Location: Right Arm)   Pulse 79   Temp 98.4 ?F (36.9 ?C) (Oral)   Resp 18    Ht 5\' 1"  (1.549 m)   Wt 88.5 kg   SpO2 100%   BMI 36.84 kg/m?  ?Physical Exam ?Vitals and nursing note reviewed.  ?Constitutional:   ?   General: She is not in acute distress. ?   Appearance: Normal appearance.  ?HENT:  ?   Head: Normocephalic and atraumatic.  ?Eyes:  ?   General:     ?   Right eye: No discharge.     ?   Left eye: No discharge.  ?Cardiovascular:  ?   Comments: Regular rate and rhythm.  S1/S2 are distinct without any evidence of murmur, rubs, or gallops.  Radial pulses are 2+ bilaterally.  Dorsalis pedis pulses are 2+ bilaterally.  No evidence of pedal edema. ?Pulmonary:  ?   Comments: Clear to auscultation bilaterally.  Normal effort.  No respiratory distress.  No evidence of wheezes, rales, or rhonchi heard throughout. ?Abdominal:  ?   General: Abdomen is flat. Bowel sounds are normal. There is no distension.  ?   Tenderness: There is no abdominal tenderness. There is no guarding or rebound.  ?Musculoskeletal:     ?   General: Normal range of motion.  ?   Cervical back: Neck supple.  ?Skin: ?   General: Skin is warm and dry.  ?   Findings: No rash.  ?Neurological:  ?  General: No focal deficit present.  ?   Mental Status: She is alert.  ?Psychiatric:     ?   Mood and Affect: Mood normal.     ?   Behavior: Behavior normal.  ? ? ?ED Results / Procedures / Treatments   ?Labs ?(all labs ordered are listed, but only abnormal results are displayed) ?Labs Reviewed  ?URINALYSIS, ROUTINE W REFLEX MICROSCOPIC  ?POC URINE PREG, ED  ? ? ?EKG ?None ? ?Radiology ?No results found. ? ?Procedures ?Procedures  ? ? ?Medications Ordered in ED ?Medications  ?fluconazole (DIFLUCAN) tablet 150 mg (has no administration in time range)  ? ? ?ED Course/ Medical Decision Making/ A&P ?  ?                        ?Medical Decision Making ?Amount and/or Complexity of Data Reviewed ?Labs: ordered. ? ?Risk ?Prescription drug management. ? ? ?This patient presents to the ED for concern of vaginal discharge and dysuria, this  involves an extensive number of treatment options, and is a complaint that carries with it a high risk of complications and morbidity.  The differential diagnosis includes vaginal candidiasis, cystitis. ? ? ?Co morbidities that complicate the patient evaluation ? ?Past Medical History:  ?Diagnosis Date  ? Bilateral chronic knee pain 04/16/2012  ? GERD (gastroesophageal reflux disease)   ? Heartburn   ? Liver disease   ? fatty liver  ? Mental disorder   ? anxiety, depression  ? Rhabdomyolysis   ? ? ?Additional history obtained: ? ?Additional history obtained from nursing note ? ? ?Lab Tests: ? ?I Ordered, and personally interpreted labs.  The pertinent results include: Urinalysis which was negative for any UTI. ? ? ?Imaging Studies ordered: ? ?None ? ? ?Cardiac Monitoring: ? ?The patient was maintained on a cardiac monitor.  I personally viewed and interpreted the cardiac monitored which showed an underlying rhythm of: Normal sinus rhythm ? ? ?Medicines ordered and prescription drug management: ? ?I ordered medication including fluconazole for vaginal candidiasis ?Reevaluation of the patient after these medicines showed that the patient stayed the same ?I have reviewed the patients home medicines and have made adjustments as needed ? ? ?Test Considered: ? ?Wet prep.  Shared decision-making was done with the patient at the bedside.  Patient is convinced this is a yeast infection.  No sexual STD risk factors at this time.  Patient would like to forego the wet prep at this time. ? ? ?Critical Interventions: ? ?Antifungals ? ? ?Problem List / ED Course: ? ?Patient presents to the emergency department today for further evaluation of vaginal discharge and dysuria.  Signs and symptoms are consistent with vaginal candidiasis.  Shared decision-making was done with the patient whether or not to do wet prep.  Patient states she had similar symptoms in the past she is confident that this is a yeast infection.  She does not want  to do a wet prep at this time.  I think this is fair.  We will give her fluconazole in the department today which should take care of it.  Patient has done Monistat at home with no improvement.  Patient was complaining of some abdominal pain but she was nontender on my exam today despite what the triage note said about right lower quadrant pain patient did not list this to me in history and again had no focal tenderness on my exam. We will have her follow-up with PCP.  She is  safe for discharge.  Strict return precautions discussed. ? ? ?Reevaluation: ? ?After the interventions noted above, I reevaluated the patient and found that they have :improved ? ? ?Social Determinants of Health: ? ?Social Determinants of Health with Concerns  ? ?Tobacco Use: High Risk  ? Smoking Tobacco Use: Every Day  ? Smokeless Tobacco Use: Never  ? Passive Exposure: Not on file  ?Financial Resource Strain: Medium Risk  ? Difficulty of Paying Living Expenses: Somewhat hard  ?Physical Activity: Inactive  ? Days of Exercise per Week: 0 days  ? Minutes of Exercise per Session: 0 min  ?Stress: Unknown  ? Feeling of Stress : Patient refused  ?Social Connections: Socially Isolated  ? Frequency of Communication with Friends and Family: More than three times a week  ? Frequency of Social Gatherings with Friends and Family: Once a week  ? Attends Religious Services: Never  ? Active Member of Clubs or Organizations: No  ? Attends Archivist Meetings: Never  ? Marital Status: Never married  ?Depression (PHQ2-9): Medium Risk  ? PHQ-2 Score: 6  ? ? ?Disposition: ? ?After consideration of the diagnostic results and the patients response to treatment, I feel that the patient would benefit from outpatient follow-up. ? ?Final Clinical Impression(s) / ED Diagnoses ?Final diagnoses:  ?Vaginal discharge  ? ? ?Rx / DC Orders ?ED Discharge Orders   ? ? None  ? ?  ? ? ?  ?Hendricks Limes, PA-C ?05/14/21 2342 ? ?  ?Myna Bright [place], [place] ?05/14/21  2343 ? ?  ?Teressa Lower, MD ?05/15/21 0015 ? ?

## 2021-06-06 ENCOUNTER — Encounter: Payer: Self-pay | Admitting: Emergency Medicine

## 2021-06-06 ENCOUNTER — Ambulatory Visit
Admission: EM | Admit: 2021-06-06 | Discharge: 2021-06-06 | Disposition: A | Discharge status: Home / Self Care | Payer: Medicaid Other | Attending: Nurse Practitioner | Admitting: Nurse Practitioner

## 2021-06-06 DIAGNOSIS — J069 Acute upper respiratory infection, unspecified: Secondary | ICD-10-CM | POA: Diagnosis not present

## 2021-06-06 MED ORDER — PSEUDOEPH-BROMPHEN-DM 30-2-10 MG/5ML PO SYRP: 5.0000 mL | ORAL_SOLUTION | Freq: Four times a day (QID) | ORAL | 0 refills | Status: DC | PRN

## 2021-06-06 MED ORDER — CETIRIZINE-PSEUDOEPHEDRINE ER 5-120 MG PO TB12: 1.0000 | ORAL_TABLET | Freq: Two times a day (BID) | ORAL | 0 refills | Status: DC

## 2021-06-06 NOTE — ED Provider Notes (Signed)
[facility]    CSN: [id number] Arrival date & time: 06/06/21  1721      History   Chief Complaint No chief complaint on file.   HPI Kimberly Norris is a 26 y.o. female.   The patient is a 26 year old female who presents with complaints of chest congestion and nasal congestion.  Symptoms have been present for the past 2 days.  She also complains of cough that is productive of greenish-yellowish sputum.  She denies fever, chills, sore throat, shortness of breath, abdominal pain, or GI symptoms.  Patient states she has been taking over-the-counter medication without relief.  The history is provided by the patient.   Past Medical History:  Diagnosis Date   Bilateral chronic knee pain 04/16/2012   GERD (gastroesophageal reflux disease)    Heartburn    Liver disease    fatty liver   Mental disorder    anxiety, depression   Rhabdomyolysis     Patient Active Problem List   Diagnosis Date Noted   Screening examination for STD (sexually transmitted disease) 01/31/2021   Rectal bleeding 12/02/2020   Grade III hemorrhoids 11/16/2020   Family history of breast cancer in mother 11/03/2020   Nipple pain 11/03/2020   Nexplanon in place 11/03/2020   Encounter for well woman exam with routine gynecological exam 11/03/2020   Adjustment disorder with emotional disturbance    Self-injurious behavior    Dysphagia 04/29/2020   Abdominal wall bulge 04/29/2020   Alternating constipation and diarrhea 03/17/2020   RUQ abdominal pain 03/17/2020   Anal fissure 02/17/2020   Skin tag of perianal region 02/17/2020   Cold sore 12/18/2019   Screen for STD (sexually transmitted disease) 12/18/2019   Urinary frequency 09/16/2019   Vaginal itching 09/16/2019   Nausea without vomiting 08/18/2019   Gastroesophageal reflux disease 08/18/2019   Loss of weight 08/18/2019   Fatty liver 08/18/2019   Early satiety 08/18/2019   Encounter for gynecological examination with Papanicolaou smear  of cervix 07/25/2018   Traumatic rhabdomyolysis (Regino Ramirez) 07/12/2015   Rhabdomyolysis 07/12/2015   BMI (body mass index), pediatric, 95-99% for age 42/16/2014   Acquired genu valgum, 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  All pathology was benign.    HEMORRHOID SURGERY N/A 12/01/2020   Procedure: HEMORRHOIDECTOMY; SIMPLE;  Surgeon: Virl Cagey, MD;  Location: [facility];  Service: General;  Laterality: N/A;   NO PAST SURGERIES      OB History     Gravida  0   Para  0   Term  0   Preterm  0   AB  0   Living  0      SAB  0   IAB  0   Ectopic  0   Multiple  0   Live Births  0            Home Medications    Prior to Admission medications   Medication Sig Start Date End Date Taking? Authorizing Provider  calcium carbonate (TUMS - DOSED IN MG ELEMENTAL CALCIUM) 500 MG chewable tablet  Chew 1 tablet by mouth daily as needed for heartburn. As needed    [provider]  etonogestrel (NEXPLANON) 68 MG IMPL implant Inject 68 mg into the skin once.     [provider]  fluconazole (DIFLUCAN) 150 MG tablet Take 1 now and 1 in 3 days 02/04/21   Estill Dooms, NP  Metamucil Fiber CHEW Chew 2 each by mouth daily.    [provider]  pantoprazole (PROTONIX) 40 MG tablet Take 1 tablet (40 mg total) by mouth daily before breakfast. 09/01/20   Erenest Rasher, PA-C  predniSONE (DELTASONE) 50 MG tablet Take 1 tab with breakfast daily 04/13/21   Volney American, PA-C  Probiotic Product (PROBIOTIC DAILY PO) Take 1 each by mouth daily.    [provider]    Family History Family History  Problem Relation Age of Onset   Breast cancer Mother 35   Autism Sister        1 sister    Social History Social History   Tobacco Use   Smoking status: Every Day    Packs/day: 0.50    Years: 5.00    Pack years: 2.50    Types: Cigarettes   Smokeless tobacco: Never   Tobacco comments:    smokes 1-2 cig daily  Vaping Use   Vaping Use: Never used  Substance Use Topics   Alcohol use: Yes    Comment: rarely   Drug use: No     Allergies   Other, Flagyl [metronidazole], and Tinidazole   Review of Systems Review of Systems PER HPI  Physical Exam Triage Vital Signs ED Triage Vitals  Enc Vitals Group     BP 06/06/21 1749 126/78     Pulse Rate 06/06/21 1749 69     Resp 06/06/21 1749 18     Temp 06/06/21 1749 98.1 F (36.7 C)     Temp Source 06/06/21 1749 Oral     SpO2 06/06/21 1749 97 %     Weight --      Height --      Head Circumference --      Peak Flow --      Pain Score 06/06/21 1751 6     Pain Loc --      Pain Edu? --      Excl. in [place]? --    No data found.  Updated Vital Signs BP 126/78 (BP Location: Right Arm)   Pulse 69   Temp 98.1 F (36.7 C) (Oral)   Resp 18   SpO2 97%   Visual Acuity Right Eye Distance:   Left Eye Distance:   Bilateral Distance:    Right Eye Near:   Left Eye Near:    Bilateral Near:     Physical Exam Vitals and nursing note reviewed.  Constitutional:      Appearance: Normal appearance.  HENT:     Head: Normocephalic.     Right Ear: Tympanic membrane, ear canal and external ear normal.     Left Ear: Tympanic membrane, ear canal and external ear normal.     Nose: Congestion present.     Right Turbinates: Enlarged and swollen.     Left Turbinates: Enlarged and swollen.     Right Sinus: Maxillary sinus tenderness present. No frontal sinus tenderness.     Left Sinus:  Maxillary sinus tenderness present. No frontal sinus tenderness.     Mouth/Throat:     Mouth: Mucous membranes are moist.  Pharynx: Posterior oropharyngeal erythema present.  Eyes:     Extraocular Movements: Extraocular movements intact.     Conjunctiva/sclera: Conjunctivae normal.     Pupils: Pupils are equal, round, and reactive to light.  Cardiovascular:     Rate and Rhythm: Normal rate and regular rhythm.     Pulses: Normal pulses.     Heart sounds: Normal heart sounds.  Pulmonary:     Effort: Pulmonary effort is normal.     Breath sounds: Normal breath sounds.  Abdominal:     General: Bowel sounds are normal.     Palpations: Abdomen is soft.     Tenderness: There is no abdominal tenderness.  Musculoskeletal:     Cervical back: Normal range of motion.  Lymphadenopathy:     Cervical: No cervical adenopathy.  Skin:    General: Skin is warm and dry.     Capillary Refill: Capillary refill takes less than 2 seconds.  Neurological:     General: No focal deficit present.     Mental Status: She is alert and oriented to person, place, and time.  Psychiatric:        Mood and Affect: Mood normal.        Behavior: Behavior normal.     UC Treatments / Results  Labs (all labs ordered are listed, but only abnormal results are displayed) Labs Reviewed - No data to display  EKG   Radiology No results found.  Procedures Procedures (including critical care time)  Medications Ordered in UC Medications - No data to display  Initial Impression / Assessment and Plan / UC Course  I have reviewed the triage vital signs and the nursing notes.  Pertinent labs & imaging results that were available during my care of the patient were reviewed by me and considered in my medical decision making (see chart for details).  Vitals and exam are overall reassuring and benign at this time.  No symptoms are consistent with a bacterial respiratory infection.  Symptoms are consistent with  allergic rhinitis versus a viral sinusitis.  We will provide the patient symptomatic treatment with Bromfed, Zyrtec-D, patient also advised to continue using normal saline nasal spray to help with her symptoms.  Discussed supportive care and over-the-counter measures with the patient, patient was provided strict return precautions.  Final Clinical Impressions(s) / UC Diagnoses   Final diagnoses:  None   Discharge Instructions   None    ED Prescriptions   None    PDMP not reviewed this encounter.   Tish Men, NP 06/06/21 1820

## 2021-06-06 NOTE — ED Triage Notes (Signed)
Chest and nasal congestion with right ear pain since Friday.  Productive cough with green sputum.

## 2021-06-06 NOTE — Discharge Instructions (Signed)
Take medication as prescribed. Increase fluids and allow for plenty of rest. May take ibuprofen or Tylenol as needed for pain, fever, or discomfort. Recommend using over-the-counter normal saline nasal spray to help with your nasal congestion. Recommend using a humidifier at bedtime during sleep to help with nasal congestion and cough. Follow-up if symptoms worsen or do not improve.

## 2021-06-08 ENCOUNTER — Other Ambulatory Visit (INDEPENDENT_AMBULATORY_CARE_PROVIDER_SITE_OTHER): Payer: Medicaid Other

## 2021-06-08 ENCOUNTER — Other Ambulatory Visit (HOSPITAL_COMMUNITY)
Admission: RE | Admit: 2021-06-08 | Discharge: 2021-06-08 | Disposition: A | Discharge status: Home / Self Care | Payer: Medicaid Other | Source: Ambulatory Visit | Attending: Obstetrics & Gynecology | Admitting: Obstetrics & Gynecology

## 2021-06-08 DIAGNOSIS — J069 Acute upper respiratory infection, unspecified: Secondary | ICD-10-CM | POA: Diagnosis present

## 2021-06-08 DIAGNOSIS — Z113 Encounter for screening for infections with a predominantly sexual mode of transmission: Secondary | ICD-10-CM | POA: Diagnosis present

## 2021-06-08 NOTE — Progress Notes (Signed)
   NURSE VISIT-STD  SUBJECTIVE:  Kimberly Norris is a 26 y.o. G0P0000 GYN patientfemale here for a vaginal swab for vaginitis screening, STD screen. "Want to be checked for everything."  She reports the following symptoms: none Denies abnormal vaginal bleeding, significant pelvic pain, fever, or UTI symptoms.  OBJECTIVE:  There were no vitals taken for this visit.  Appears well, in no apparent distress  ASSESSMENT: Vaginal swab for STD screen  PLAN: Self-collected vaginal probe for Gonorrhea, Chlamydia, Trichomonas, Bacterial Vaginosis, Yeast sent to lab. Patient requested HIV and RPR testing as well. Treatment: to be determined once results are received Follow-up as needed if symptoms persist/worsen, or new symptoms develop  Alice Rieger  06/08/2021 2:49 PM

## 2021-06-09 ENCOUNTER — Ambulatory Visit
Admission: EM | Admit: 2021-06-09 | Discharge: 2021-06-09 | Disposition: A | Discharge status: Home / Self Care | Payer: Medicaid Other | Attending: Nurse Practitioner | Admitting: Nurse Practitioner

## 2021-06-09 ENCOUNTER — Encounter: Payer: Self-pay | Admitting: Emergency Medicine

## 2021-06-09 DIAGNOSIS — Z113 Encounter for screening for infections with a predominantly sexual mode of transmission: Secondary | ICD-10-CM | POA: Insufficient documentation

## 2021-06-09 DIAGNOSIS — J069 Acute upper respiratory infection, unspecified: Secondary | ICD-10-CM | POA: Diagnosis not present

## 2021-06-09 LAB — HIV ANTIBODY (ROUTINE TESTING W REFLEX): HIV Screen 4th Generation wRfx: NONREACTIVE

## 2021-06-09 LAB — RPR: RPR Ser Ql: NONREACTIVE

## 2021-06-09 MED ORDER — PREDNISONE 20 MG PO TABS: 40.0000 mg | ORAL_TABLET | Freq: Every day | ORAL | 0 refills | Status: AC

## 2021-06-09 NOTE — ED Triage Notes (Signed)
Seen on Monday for same symptoms.  Chest and nasal congestion with cough since Friday.  Productive cough with brownish color sputum.  States both ears feel clogged.     States she couldn't afford cough medication that was called in, so has been taking over the counter cough medications.

## 2021-06-09 NOTE — ED Provider Notes (Signed)
[facility]    CSN: [id number] Arrival date & time: 06/09/21  1308      History   Chief Complaint No chief complaint on file.   HPI Kimberly Norris is a 26 y.o. female.   HPI  The patient is a 26 year old female who presents with complaints of continued chest congestion and nasal congestion.  Symptoms have been present for the past 5 days.  Patient was seen on 06/06/21 for the same symptoms. She reports she was unable to get the medication prescribed because it wasn't covered by her insurance. States she tried OTC cough medicine with "DM" in it. She continues to denies fever, chills, sore throat, shortness of breath, abdominal pain, or GI symptoms.    Past Medical History:  Diagnosis Date   Bilateral chronic knee pain 04/16/2012   GERD (gastroesophageal reflux disease)    Heartburn    Liver disease    fatty liver   Mental disorder    anxiety, depression   Rhabdomyolysis     Patient Active Problem List   Diagnosis Date Noted   Screening examination for STD (sexually transmitted disease) 01/31/2021   Rectal bleeding 12/02/2020   Grade III hemorrhoids 11/16/2020   Family history of breast cancer in mother 11/03/2020   Nipple pain 11/03/2020   Nexplanon in place 11/03/2020   Encounter for well woman exam with routine gynecological exam 11/03/2020   Adjustment disorder with emotional disturbance    Self-injurious behavior    Dysphagia 04/29/2020   Abdominal wall bulge 04/29/2020   Alternating constipation and diarrhea 03/17/2020   RUQ abdominal pain 03/17/2020   Anal fissure 02/17/2020   Skin tag of perianal region 02/17/2020   Cold sore 12/18/2019   Screen for STD (sexually transmitted disease) 12/18/2019   Urinary frequency 09/16/2019   Vaginal itching 09/16/2019   Nausea without vomiting 08/18/2019   Gastroesophageal reflux disease 08/18/2019   Loss of weight 08/18/2019   Fatty liver 08/18/2019   Early satiety 08/18/2019   Encounter for  gynecological examination with Papanicolaou smear of cervix 07/25/2018   Traumatic rhabdomyolysis ([place]) 07/12/2015   Rhabdomyolysis 07/12/2015   BMI (body mass index), pediatric, 95-99% for age 38/16/2014   Acquired genu valgum, 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  All pathology was benign.    HEMORRHOID SURGERY N/A 12/01/2020   Procedure: HEMORRHOIDECTOMY; SIMPLE;  Surgeon: Virl Cagey, MD;  Location: [facility];  Service: General;  Laterality: N/A;   NO PAST SURGERIES      OB History     Gravida  0   Para  0   Term  0   Preterm  0   AB  0   Living  0      SAB  0   IAB  0   Ectopic  0   Multiple  0   Live Births  0            Home Medications    Prior to Admission medications   Medication Sig Start Date End Date Taking? Authorizing Provider  predniSONE (DELTASONE)  20 MG tablet Take 2 tablets (40 mg total) by mouth daily with breakfast for 5 days. 06/09/21 06/14/21 Yes Coraima Tibbs-Warren, Alda Lea, NP  brompheniramine-pseudoephedrine-DM 30-2-10 MG/5ML syrup Take 5 mLs by mouth 4 (four) times daily as needed. 06/06/21   Naira Standiford-Warren, Alda Lea, NP  calcium carbonate (TUMS - DOSED IN MG ELEMENTAL CALCIUM) 500 MG chewable tablet Chew 1 tablet by mouth daily as needed for heartburn. As needed    [provider]  cetirizine-pseudoephedrine (ZYRTEC-D) 5-120 MG tablet Take 1 tablet by mouth 2 (two) times daily. 06/06/21   Jodean Valade-Warren, Alda Lea, NP  etonogestrel  (NEXPLANON) 68 MG IMPL implant Inject 68 mg into the skin once.     [provider]  fluconazole (DIFLUCAN) 150 MG tablet Take 1 now and 1 in 3 days 02/04/21   Estill Dooms, NP  Metamucil Fiber CHEW Chew 2 each by mouth daily.    [provider]  pantoprazole (PROTONIX) 40 MG tablet Take 1 tablet (40 mg total) by mouth daily before breakfast. 09/01/20   Erenest Rasher, PA-C  Probiotic Product (PROBIOTIC DAILY PO) Take 1 each by mouth daily.    [provider]    Family History Family History  Problem Relation Age of Onset   Breast cancer Mother 3   Autism Sister        1 sister    Social History Social History   Tobacco Use   Smoking status: Every Day    Packs/day: 0.50    Years: 5.00    Pack years: 2.50    Types: Cigarettes   Smokeless tobacco: Never   Tobacco comments:    smokes 1-2 cig daily  Vaping Use   Vaping Use: Never used  Substance Use Topics   Alcohol use: Yes    Comment: rarely   Drug use: No     Allergies   Other, Flagyl [metronidazole], and Tinidazole   Review of Systems Review of Systems PER HPI  Physical Exam Triage Vital Signs ED Triage Vitals  Enc Vitals Group     BP 06/09/21 1402 129/81     Pulse Rate 06/09/21 1402 77     Resp 06/09/21 1402 18     Temp 06/09/21 1402 98.3 F (36.8 C)     Temp Source 06/09/21 1402 Oral     SpO2 06/09/21 1402 98 %     Weight --      Height --      Head Circumference --      Peak Flow --      Pain Score 06/09/21 1404 0     Pain Loc --      Pain Edu? --      Excl. in [place]? --    No data found.  Updated Vital Signs BP 129/81 (BP Location: Right Arm)   Pulse 77   Temp 98.3 F (36.8 C) (Oral)   Resp 18   SpO2 98%   Visual Acuity Right Eye Distance:   Left Eye Distance:   Bilateral Distance:    Right Eye Near:   Left Eye Near:    Bilateral Near:     Physical Exam Vitals and nursing note reviewed.  Constitutional:      Appearance: Normal appearance.   HENT:     Head: Normocephalic.     Right Ear: Tympanic membrane, ear canal and external ear normal.     Left Ear: Tympanic membrane, ear canal and external ear normal.     Nose: Congestion  present.     Mouth/Throat:     Mouth: Mucous membranes are moist.  Eyes:     Extraocular Movements: Extraocular movements intact.     Conjunctiva/sclera: Conjunctivae normal.  Cardiovascular:     Rate and Rhythm: Normal rate and regular rhythm.     Pulses: Normal pulses.     Heart sounds: Normal heart sounds.  Pulmonary:     Effort: Pulmonary effort is normal. No respiratory distress.     Breath sounds: Normal breath sounds. No wheezing or rales.  Abdominal:     General: Bowel sounds are normal.     Palpations: Abdomen is soft.  Skin:    General: Skin is warm and dry.     Capillary Refill: Capillary refill takes less than 2 seconds.  Neurological:     General: No focal deficit present.     Mental Status: She is alert and oriented to person, place, and time.  Psychiatric:        Mood and Affect: Mood normal.        Behavior: Behavior normal.     UC Treatments / Results  Labs (all labs ordered are listed, but only abnormal results are displayed) Labs Reviewed - No data to display  EKG   Radiology No results found.  Procedures Procedures (including critical care time)  Medications Ordered in UC Medications - No data to display  Initial Impression / Assessment and Plan / UC Course  I have reviewed the triage vital signs and the nursing notes.  Pertinent labs & imaging results that were available during my care of the patient were reviewed by me and considered in my medical decision making (see chart for details).  Vitals and exam are continue to be reassuring and benign at this time.  No symptoms are consistent with a bacterial respiratory infection.  Symptoms are consistent with allergic rhinitis versus a viral sinusitis.  Patient was advised to utilize symptomatic treatment  with Bromfed, Zyrtec-D, patient also advised to continue using normal saline nasal spray to help with her symptoms. Prednisone was prescribed to help with cough today.  Discussed supportive care and over-the-counter measures with the patient, patient was provided strict return precautions. Final Clinical Impressions(s) / UC Diagnoses   Final diagnoses:  Acute upper respiratory infection     Discharge Instructions      Take medication as prescribed.  If you are able, it is highly recommended that you start the treatment previously prescribed.  I am prescribing prednisone for 5 days to help with your cough. Increase fluids and allow for plenty of rest. May continue over-the-counter ibuprofen or Tylenol as needed for pain or discomfort. Follow-up as needed.     ED Prescriptions     Medication Sig Dispense Auth. Provider   predniSONE (DELTASONE) 20 MG tablet Take 2 tablets (40 mg total) by mouth daily with breakfast for 5 days. 10 tablet Kimberly Norris, Alda Lea, NP      PDMP not reviewed this encounter.   Tish Men, NP 06/09/21 1432

## 2021-06-09 NOTE — Discharge Instructions (Signed)
Take medication as prescribed.  If you are able, it is highly recommended that you start the treatment previously prescribed.  I am prescribing prednisone for 5 days to help with your cough. Increase fluids and allow for plenty of rest. May continue over-the-counter ibuprofen or Tylenol as needed for pain or discomfort. Follow-up as needed.

## 2021-06-10 LAB — CERVICOVAGINAL ANCILLARY ONLY
Bacterial Vaginitis (gardnerella): NEGATIVE
Candida Glabrata: NEGATIVE
Candida Vaginitis: NEGATIVE
Chlamydia: NEGATIVE
Comment: NEGATIVE
Comment: NEGATIVE
Comment: NEGATIVE
Comment: NEGATIVE
Comment: NEGATIVE
Comment: NORMAL
Neisseria Gonorrhea: NEGATIVE
Trichomonas: NEGATIVE

## 2021-07-28 ENCOUNTER — Ambulatory Visit
Admission: EM | Admit: 2021-07-28 | Discharge: 2021-07-28 | Disposition: A | Discharge status: Home / Self Care | Payer: Medicaid Other | Attending: Nurse Practitioner | Admitting: Nurse Practitioner

## 2021-07-28 ENCOUNTER — Encounter: Payer: Self-pay | Admitting: Emergency Medicine

## 2021-07-28 DIAGNOSIS — J9801 Acute bronchospasm: Secondary | ICD-10-CM

## 2021-07-28 DIAGNOSIS — M546 Pain in thoracic spine: Secondary | ICD-10-CM

## 2021-07-28 MED ORDER — PREDNISONE 20 MG PO TABS: 40.0000 mg | ORAL_TABLET | Freq: Every day | ORAL | 0 refills | Status: AC

## 2021-07-28 MED ORDER — ALBUTEROL SULFATE HFA 108 (90 BASE) MCG/ACT IN AERS: 2.0000 | INHALATION_SPRAY | Freq: Four times a day (QID) | RESPIRATORY_TRACT | 2 refills | Status: DC | PRN

## 2021-07-28 NOTE — Discharge Instructions (Signed)
Take medication as prescribed. Increase fluids and allow for plenty of rest. Recommend Tylenol or ibuprofen as needed for pain, fever, or general discomfort. Recommend using a humidifier at bedtime during sleep to help with cough. Sleep elevated on 2 pillows while cough symptoms persist. Go to the emergency department if you experience difficulty breathing, shortness of breath, inability to speak in a complete sentence, or other concerns.  For your back pain, apply ice or heat.  Apply ice for pain or swelling, heat for spasm or stiffness, apply for 20 minutes, remove for 1 hour, then repeat. Gentle stretching and range of motion exercises.  Follow-up as needed.

## 2021-07-28 NOTE — ED Provider Notes (Signed)
[facility]    CSN: [id number] Arrival date & time: 07/28/21  1444      History   Chief Complaint No chief complaint on file.   HPI Kimberly Norris is a 26 y.o. female.   HPI  Patient presents for complaints of thoracic back pain and cough that started after she accidentally inhaled chlorine while cleaning her pool.  Patient states she began coughing uncontrollably, EMS was called to the scene.  She states since that time, she has had pain in the middle upper portion of her back.  She also states that her cough is since become productive.  She denies fever, chills, shortness of breath, difficulty breathing, trouble breathing, or wheezing.  She states her cough symptoms have since improved.  She denies any previous history of asthma.  She has not taken any medication for her symptoms.  Past Medical History:  Diagnosis Date   Bilateral chronic knee pain 04/16/2012   GERD (gastroesophageal reflux disease)    Heartburn    Liver disease    fatty liver   Mental disorder    anxiety, depression   Rhabdomyolysis     Patient Active Problem List   Diagnosis Date Noted   Screening examination for STD (sexually transmitted disease) 01/31/2021   Rectal bleeding 12/02/2020   Grade III hemorrhoids 11/16/2020   Family history of breast cancer in mother 11/03/2020   Nipple pain 11/03/2020   Nexplanon in place 11/03/2020   Encounter for well woman exam with routine gynecological exam 11/03/2020   Adjustment disorder with emotional disturbance    Self-injurious behavior    Dysphagia 04/29/2020   Abdominal wall bulge 04/29/2020   Alternating constipation and diarrhea 03/17/2020   RUQ abdominal pain 03/17/2020   Anal fissure 02/17/2020   Skin tag of perianal region 02/17/2020   Cold sore 12/18/2019   Screen for STD (sexually transmitted disease) 12/18/2019   Urinary frequency 09/16/2019   Vaginal itching 09/16/2019   Nausea without vomiting 08/18/2019   Gastroesophageal  reflux disease 08/18/2019   Loss of weight 08/18/2019   Fatty liver 08/18/2019   Early satiety 08/18/2019   Encounter for gynecological examination with Papanicolaou smear of cervix 07/25/2018   Traumatic rhabdomyolysis ([place]) 07/12/2015   Rhabdomyolysis 07/12/2015   BMI (body mass index), pediatric, 95-99% for age 99/16/2014   Acquired genu valgum, 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  All pathology was benign.    HEMORRHOID SURGERY N/A 12/01/2020   Procedure: HEMORRHOIDECTOMY; SIMPLE;  Surgeon: Virl Cagey, MD;  Location: [facility];  Service: General;  Laterality: N/A;   NO PAST SURGERIES      OB History     Gravida  0   Para  0   Term  0   Preterm  0   AB  0   Living  0      SAB  0   IAB  0   Ectopic  0   Multiple  0   Live Births  0            Home  Medications    Prior to Admission medications   Medication Sig Start Date End Date Taking? Authorizing Provider  albuterol (VENTOLIN HFA) 108 (90 Base) MCG/ACT inhaler Inhale 2 puffs into the lungs every 6 (six) hours as needed for wheezing or shortness of breath. 07/28/21  Yes Janeen Watson-Warren, Sadie Haber, NP  predniSONE (DELTASONE) 20 MG tablet Take 2 tablets (40 mg total) by mouth daily with breakfast for 5 days. 07/28/21 08/02/21 Yes Mattison Golay-Warren, Sadie Haber, NP  brompheniramine-pseudoephedrine-DM 30-2-10 MG/5ML syrup Take 5 mLs by mouth 4 (four) times daily as needed. 06/06/21   Allison Silva-Warren, Sadie Haber, NP  calcium  carbonate (TUMS - DOSED IN MG ELEMENTAL CALCIUM) 500 MG chewable tablet Chew 1 tablet by mouth daily as needed for heartburn. As needed    [provider]  cetirizine-pseudoephedrine (ZYRTEC-D) 5-120 MG tablet Take 1 tablet by mouth 2 (two) times daily. 06/06/21   Peggyann Zwiefelhofer-Warren, Sadie Haber, NP  etonogestrel (NEXPLANON) 68 MG IMPL implant Inject 68 mg into the skin once.     [provider]  fluconazole (DIFLUCAN) 150 MG tablet Take 1 now and 1 in 3 days 02/04/21   Adline Potter, NP  Metamucil Fiber CHEW Chew 2 each by mouth daily.    [provider]  pantoprazole (PROTONIX) 40 MG tablet Take 1 tablet (40 mg total) by mouth daily before breakfast. 09/01/20   Letta Median, PA-C  Probiotic Product (PROBIOTIC DAILY PO) Take 1 each by mouth daily.    [provider]    Family History Family History  Problem Relation Age of Onset   Breast cancer Mother 75   Autism Sister        1 sister    Social History Social History   Tobacco Use   Smoking status: Every Day    Packs/day: 0.50    Years: 5.00    Total pack years: 2.50    Types: Cigarettes   Smokeless tobacco: Never   Tobacco comments:    smokes 1-2 cig daily  Vaping Use   Vaping Use: Never used  Substance Use Topics   Alcohol use: Yes    Comment: rarely   Drug use: No     Allergies   Other, Flagyl [metronidazole], and Tinidazole   Review of Systems Review of Systems Per HPI  Physical Exam Triage Vital Signs ED Triage Vitals  Enc Vitals Group     BP 07/28/21 1451 121/76     Pulse Rate 07/28/21 1451 82     Resp 07/28/21 1451 18     Temp 07/28/21 1451 98.5 F (36.9 C)     Temp Source 07/28/21 1451 Oral     SpO2 07/28/21 1451 97 %     Weight --      Height --      Head Circumference --      Peak Flow --      Pain Score 07/28/21 1452 8     Pain Loc --      Pain Edu? --      Excl. in [place]? --    No data found.  Updated Vital Signs BP 121/76 (BP Location: Right Arm)    Pulse 82   Temp 98.5 F (36.9 C) (Oral)   Resp 18   SpO2 97%   Visual Acuity Right Eye Distance:   Left Eye Distance:   Bilateral Distance:    Right Eye Near:   Left Eye Near:    Bilateral Near:     Physical Exam  Vitals reviewed.  Constitutional:      General: She is not in acute distress.    Appearance: Normal appearance. She is well-developed.  HENT:     Head: Normocephalic.     Right Ear: Tympanic membrane, ear canal and external ear normal.     Left Ear: Tympanic membrane, ear canal and external ear normal.     Nose: Nose normal.     Mouth/Throat:     Mouth: Mucous membranes are moist.  Eyes:     Extraocular Movements: Extraocular movements intact.     Conjunctiva/sclera: Conjunctivae normal.     Pupils: Pupils are equal, round, and reactive to light.  Cardiovascular:     Rate and Rhythm: Normal rate and regular rhythm.     Pulses: Normal pulses.     Heart sounds: Normal heart sounds.  Pulmonary:     Effort: Pulmonary effort is normal.     Breath sounds: Normal breath sounds.  Abdominal:     General: Bowel sounds are normal. There is no distension.     Palpations: Abdomen is soft.     Tenderness: There is no abdominal tenderness. There is no guarding or rebound.  Genitourinary:    Vagina: Normal. No vaginal discharge.  Skin:    General: Skin is warm and dry.     Findings: No erythema or rash.  Neurological:     General: No focal deficit present.     Mental Status: She is alert and oriented to person, place, and time.     Cranial Nerves: No cranial nerve deficit.  Psychiatric:        Mood and Affect: Mood normal.        Behavior: Behavior normal.      UC Treatments / Results  Labs (all labs ordered are listed, but only abnormal results are displayed) Labs Reviewed - No data to display  EKG   Radiology No results found.  Procedures Procedures (including critical care time)  Medications Ordered in UC Medications - No data to display  Initial  Impression / Assessment and Plan / UC Course  I have reviewed the triage vital signs and the nursing notes.  Pertinent labs & imaging results that were available during my care of the patient were reviewed by me and considered in my medical decision making (see chart for details).  Patient presents for complaints of cough and thoracic back pain.  Patient states she was cleaning a pool when she inhaled chlorine.  On exam, patient's lung sounds are clear throughout.  There is no wheezing, rales, or rhonchi present.  She does have thoracic back pain between T6-T8.  Vital signs are stable, she is in no acute distress.  Oxygen saturation is 97%.  Symptoms appear to be consistent with acute bronchospasm due to the inhalation of the chlorine.  Albuterol inhaler and prednisone were prescribed for her symptoms.  Supportive care recommendations were provided to the patient.  Patient was advised to follow-up in this clinic, she was also given strict indications of when to go to the emergency department. Final Clinical Impressions(s) / UC Diagnoses   Final diagnoses:  Bronchospasm, acute  Acute midline thoracic back pain     Discharge Instructions      Take medication as prescribed. Increase fluids and allow for plenty of rest. Recommend Tylenol or ibuprofen as needed for pain, fever, or general discomfort. Recommend using a humidifier at bedtime during sleep to help with cough. Sleep elevated on 2 pillows while cough symptoms persist.  Go to the emergency department if you experience difficulty breathing, shortness of breath, inability to speak in a complete sentence, or other concerns.  For your back pain, apply ice or heat.  Apply ice for pain or swelling, heat for spasm or stiffness, apply for 20 minutes, remove for 1 hour, then repeat. Gentle stretching and range of motion exercises.  Follow-up as needed.      ED Prescriptions     Medication Sig Dispense Auth. Provider   albuterol  (VENTOLIN HFA) 108 (90 Base) MCG/ACT inhaler Inhale 2 puffs into the lungs every 6 (six) hours as needed for wheezing or shortness of breath. 8 g Levie Owensby-Warren, Alda Lea, NP   predniSONE (DELTASONE) 20 MG tablet Take 2 tablets (40 mg total) by mouth daily with breakfast for 5 days. 10 tablet Levone Otten-Warren, Alda Lea, NP      PDMP not reviewed this encounter.   Tish Men, NP 07/28/21 1510
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
## 2021-08-19 ENCOUNTER — Other Ambulatory Visit: Payer: Self-pay

## 2021-08-19 ENCOUNTER — Ambulatory Visit
Admission: EM | Admit: 2021-08-19 | Discharge: 2021-08-19 | Disposition: A | Discharge status: Home / Self Care | Payer: Medicaid Other | Attending: Family Medicine | Admitting: Family Medicine

## 2021-08-19 ENCOUNTER — Encounter: Payer: Self-pay | Admitting: Emergency Medicine

## 2021-08-19 DIAGNOSIS — L989 Disorder of the skin and subcutaneous tissue, unspecified: Secondary | ICD-10-CM

## 2021-08-19 MED ORDER — CLOBETASOL PROPIONATE 0.05 % EX OINT: 1.0000 | TOPICAL_OINTMENT | Freq: Two times a day (BID) | CUTANEOUS | 0 refills | Status: DC

## 2021-08-19 MED ORDER — MUPIROCIN 2 % EX OINT: 1.0000 | TOPICAL_OINTMENT | Freq: Two times a day (BID) | CUTANEOUS | 0 refills | Status: DC

## 2021-08-19 NOTE — ED Triage Notes (Signed)
Pt reports found a tick crawling in her hair and reports ever sine has had a "burning sensation" on the right side of head.

## 2021-08-22 NOTE — ED Provider Notes (Signed)
[facility]    CSN: [id number] Arrival date & time: 08/19/21  1923      History   Chief Complaint Chief Complaint  Patient presents with   Head Laceration    HPI Kimberly Norris is a 26 y.o. female.   Presenting today with a burning sensation to the right side of her scalp that started yesterday.  She did find a tick crawling in her hair but it was not attached.  No new products, new exposures or foods, no new medications.  Has not tried anything over-the-counter for symptoms since onset.    Past Medical History:  Diagnosis Date   Bilateral chronic knee pain 04/16/2012   GERD (gastroesophageal reflux disease)    Heartburn    Liver disease    fatty liver   Mental disorder    anxiety, depression   Rhabdomyolysis    Patient Active Problem List   Diagnosis Date Noted   Screening examination for STD (sexually transmitted disease) 01/31/2021   Rectal bleeding 12/02/2020   Grade III hemorrhoids 11/16/2020   Family history of breast cancer in mother 11/03/2020   Nipple pain 11/03/2020   Nexplanon in place 11/03/2020   Encounter for well woman exam with routine gynecological exam 11/03/2020   Adjustment disorder with emotional disturbance    Self-injurious behavior    Dysphagia 04/29/2020   Abdominal wall bulge 04/29/2020   Alternating constipation and diarrhea 03/17/2020   RUQ abdominal pain 03/17/2020   Anal fissure 02/17/2020   Skin tag of perianal region 02/17/2020   Cold sore 12/18/2019   Screen for STD (sexually transmitted disease) 12/18/2019   Urinary frequency 09/16/2019   Vaginal itching 09/16/2019   Nausea without vomiting 08/18/2019   Gastroesophageal reflux disease 08/18/2019   Loss of weight 08/18/2019   Fatty liver 08/18/2019   Early satiety 08/18/2019   Encounter for gynecological examination with Papanicolaou smear of cervix 07/25/2018   Traumatic rhabdomyolysis (HCC) 07/12/2015   Rhabdomyolysis 07/12/2015   BMI (body mass index),  pediatric, 95-99% for age 14/16/2014   Acquired genu valgum, 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  All pathology was benign.    HEMORRHOID SURGERY N/A 12/01/2020   Procedure: HEMORRHOIDECTOMY; SIMPLE;  Surgeon: [name], MD;  Location: [facility];  Service: General;  Laterality: N/A;   NO PAST SURGERIES     OB History     Gravida  0   Para  0   Term  0   Preterm  0   AB  0   Living  0      SAB  0   IAB  0   Ectopic  0   Multiple  0   Live Births  0          Home Medications    Prior to Admission medications   Medication Sig Start Date End Date Taking? Authorizing Provider  clobetasol ointment (TEMOVATE) 0.05 % Apply 1 Application topically 2 (two) times daily. 08/19/21  Yes Particia Nearing, PA-C  mupirocin ointment (BACTROBAN) 2 % Apply 1 Application topically 2 (  two) times daily. 08/19/21  Yes Particia Nearing, PA-C  albuterol (VENTOLIN HFA) 108 (90 Base) MCG/ACT inhaler Inhale 2 puffs into the lungs every 6 (six) hours as needed for wheezing or shortness of breath. 07/28/21   Leath-Warren, Sadie Haber, NP  brompheniramine-pseudoephedrine-DM 30-2-10 MG/5ML syrup Take 5 mLs by mouth 4 (four) times daily as needed. 06/06/21   Leath-Warren, Sadie Haber, NP  calcium carbonate (TUMS - DOSED IN MG ELEMENTAL CALCIUM) 500 MG chewable tablet Chew 1 tablet by mouth daily as needed for heartburn. As needed    [provider]  cetirizine-pseudoephedrine (ZYRTEC-D) 5-120  MG tablet Take 1 tablet by mouth 2 (two) times daily. 06/06/21   Leath-Warren, Sadie Haber, NP  etonogestrel (NEXPLANON) 68 MG IMPL implant Inject 68 mg into the skin once.     [provider]  fluconazole (DIFLUCAN) 150 MG tablet Take 1 now and 1 in 3 days 02/04/21   Adline Potter, NP  Metamucil Fiber CHEW Chew 2 each by mouth daily.    [provider]  pantoprazole (PROTONIX) 40 MG tablet Take 1 tablet (40 mg total) by mouth daily before breakfast. 09/01/20   Letta Median, PA-C  Probiotic Product (PROBIOTIC DAILY PO) Take 1 each by mouth daily.    [provider]    Family History Family History  Problem Relation Age of Onset   Breast cancer Mother 76   Autism Sister        1 sister    Social History Social History   Tobacco Use   Smoking status: Every Day    Packs/day: 0.50    Years: 5.00    Total pack years: 2.50    Types: Cigarettes   Smokeless tobacco: Never   Tobacco comments:    smokes 1-2 cig daily  Vaping Use   Vaping Use: Never used  Substance Use Topics   Alcohol use: Yes    Comment: rarely   Drug use: No     Allergies   Other, Flagyl [metronidazole], and Tinidazole   Review of Systems Review of Systems PER HPI  Physical Exam Triage Vital Signs ED Triage Vitals [08/19/21 1931]  Enc Vitals Group     BP 131/84     Pulse Rate 81     Resp 18     Temp 98.2 F (36.8 C)     Temp Source Oral     SpO2 98 %     Weight      Height      Head Circumference      Peak Flow      Pain Score 7     Pain Loc      Pain Edu?      Excl. in [place]?    No data found.  Updated Vital Signs BP 131/84 (BP Location: Right Arm)   Pulse 81   Temp 98.2 F (36.8 C) (Oral)   Resp 18   LMP 08/07/2021 (Approximate)   SpO2 98%   Visual Acuity Right Eye Distance:   Left Eye Distance:   Bilateral Distance:    Right Eye Near:   Left Eye Near:    Bilateral Near:     Physical Exam Vitals and nursing note reviewed.  Constitutional:       Appearance: Normal appearance. She is not ill-appearing.  HENT:     Head: Atraumatic.     Mouth/Throat:     Mouth: Mucous membranes are moist.  Eyes:     Extraocular Movements: Extraocular  movements intact.     Conjunctiva/sclera: Conjunctivae normal.  Cardiovascular:     Rate and Rhythm: Normal rate and regular rhythm.     Heart sounds: Normal heart sounds.  Pulmonary:     Effort: Pulmonary effort is normal.     Breath sounds: Normal breath sounds.  Musculoskeletal:        General: Normal range of motion.     Cervical back: Normal range of motion and neck supple.  Skin:    General: Skin is warm.     Comments: 2 small erythematous ulcerations to scalp, tender to palpation, scabbed.  No drainage, edema, fluctuance, induration  Neurological:     Mental Status: She is alert and oriented to person, place, and time.  Psychiatric:        Mood and Affect: Mood normal.        Thought Content: Thought content normal.        Judgment: Judgment normal.      UC Treatments / Results  Labs (all labs ordered are listed, but only abnormal results are displayed) Labs Reviewed - No data to display  EKG   Radiology No results found.  Procedures Procedures (including critical care time)  Medications Ordered in UC Medications - No data to display  Initial Impression / Assessment and Plan / UC Course  I have reviewed the triage vital signs and the nursing notes.  Pertinent labs & imaging results that were available during my care of the patient were reviewed by me and considered in my medical decision making (see chart for details).     Treat with clobetasol, mupirocin ointment unclear if inflammatory or early infectious at this time.  Discussed good wound care, return precautions. Final Clinical Impressions(s) / UC Diagnoses   Final diagnoses:  Scalp lesion   Discharge Instructions   None    ED Prescriptions     Medication Sig Dispense Auth. Provider   clobetasol  ointment (TEMOVATE) [id number] % Apply 1 Application topically 2 (two) times daily. 30 g Volney American, PA-C   mupirocin ointment (BACTROBAN) 2 % Apply 1 Application topically 2 (two) times daily. 22 g Volney American, [place]      PDMP not reviewed this encounter.   Volney American, [place] 08/22/21 1430

## 2021-09-13 ENCOUNTER — Ambulatory Visit
Admission: EM | Admit: 2021-09-13 | Discharge: 2021-09-13 | Disposition: A | Discharge status: Home / Self Care | Payer: Medicaid Other | Attending: Nurse Practitioner | Admitting: Nurse Practitioner

## 2021-09-13 ENCOUNTER — Ambulatory Visit (INDEPENDENT_AMBULATORY_CARE_PROVIDER_SITE_OTHER): Payer: Medicaid Other

## 2021-09-13 DIAGNOSIS — S67197A Crushing injury of left little finger, initial encounter: Secondary | ICD-10-CM | POA: Diagnosis not present

## 2021-09-13 DIAGNOSIS — S61217A Laceration without foreign body of left little finger without damage to nail, initial encounter: Secondary | ICD-10-CM | POA: Diagnosis not present

## 2021-09-13 DIAGNOSIS — M79645 Pain in left finger(s): Secondary | ICD-10-CM

## 2021-09-13 DIAGNOSIS — S6710XA Crushing injury of unspecified finger(s), initial encounter: Secondary | ICD-10-CM

## 2021-09-13 NOTE — ED Provider Notes (Signed)
[facility]    CSN: [id number] Arrival date & time: 09/13/21  1233      History   Chief Complaint Chief Complaint  Patient presents with   Finger Injury    HPI Kimberly Norris is a 26 y.o. female.   The history is provided by the patient.   Patient presents for complaints of left small finger pain and swelling.  Patient states the finger was slammed in the front door of her home approximately 3 days ago.  Since that time, she states that she has a wound to the side of the left pinky finger.  She states that she also has pain that radiates into the hand under the left pinky finger.  She denies fever, chills, wrist pain, decreased range of motion, numbness, tingling, or weakness.  She states that her last tetanus shot was 1 year ago.  She has been using antibiotic ointment to the left pinky finger along with ice, she reports that the area is still swollen.  Past Medical History:  Diagnosis Date   Bilateral chronic knee pain 04/16/2012   GERD (gastroesophageal reflux disease)    Heartburn    Liver disease    fatty liver   Mental disorder    anxiety, depression   Rhabdomyolysis     Patient Active Problem List   Diagnosis Date Noted   Screening examination for STD (sexually transmitted disease) 01/31/2021   Rectal bleeding 12/02/2020   Grade III hemorrhoids 11/16/2020   Family history of breast cancer in mother 11/03/2020   Nipple pain 11/03/2020   Nexplanon in place 11/03/2020   Encounter for well woman exam with routine gynecological exam 11/03/2020   Adjustment disorder with emotional disturbance    Self-injurious behavior    Dysphagia 04/29/2020   Abdominal wall bulge 04/29/2020   Alternating constipation and diarrhea 03/17/2020   RUQ abdominal pain 03/17/2020   Anal fissure 02/17/2020   Skin tag of perianal region 02/17/2020   Cold sore 12/18/2019   Screen for STD (sexually transmitted disease) 12/18/2019   Urinary frequency 09/16/2019   Vaginal  itching 09/16/2019   Nausea without vomiting 08/18/2019   Gastroesophageal reflux disease 08/18/2019   Loss of weight 08/18/2019   Fatty liver 08/18/2019   Early satiety 08/18/2019   Encounter for gynecological examination with Papanicolaou smear of cervix 07/25/2018   Traumatic rhabdomyolysis (HCC) 07/12/2015   Rhabdomyolysis 07/12/2015   BMI (body mass index), pediatric, 95-99% for age 67/16/2014   Acquired genu valgum, 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  All pathology was benign.    HEMORRHOID SURGERY N/A 12/01/2020   Procedure: HEMORRHOIDECTOMY; SIMPLE;  Surgeon: [name], MD;  Location: [facility];  Service: General;  Laterality: N/A;   NO PAST SURGERIES      OB History     Gravida  0   Para  0   Term  0   Preterm  0   AB  0   Living  0      SAB  0   IAB  0   Ectopic  0   Multiple  0   Live Births  0            Home Medications    Prior to Admission medications   Medication Sig Start Date End Date Taking? Authorizing Provider  albuterol (VENTOLIN HFA) 108 (90 Base) MCG/ACT inhaler Inhale 2 puffs into the lungs every 6 (six) hours as needed for wheezing or shortness of breath. 07/28/21   Zaira Iacovelli-Warren, Alda Lea, NP  brompheniramine-pseudoephedrine-DM 30-2-10 MG/5ML syrup Take 5 mLs by mouth 4 (four) times daily as needed. 06/06/21   Abrar Koone-Warren, Alda Lea, NP  calcium carbonate (TUMS - DOSED IN MG ELEMENTAL CALCIUM) 500 MG chewable tablet Chew 1 tablet by  mouth daily as needed for heartburn. As needed    [provider]  cetirizine-pseudoephedrine (ZYRTEC-D) 5-120 MG tablet Take 1 tablet by mouth 2 (two) times daily. 06/06/21   Juana Haralson-Warren, Alda Lea, NP  clobetasol ointment (TEMOVATE) [id number] % Apply 1 Application topically 2 (two) times daily. 08/19/21   Volney American, PA-C  etonogestrel (NEXPLANON) 68 MG IMPL implant Inject 68 mg into the skin once.     [provider]  fluconazole (DIFLUCAN) 150 MG tablet Take 1 now and 1 in 3 days 02/04/21   Estill Dooms, NP  Metamucil Fiber CHEW Chew 2 each by mouth daily.    [provider]  mupirocin ointment (BACTROBAN) 2 % Apply 1 Application topically 2 (two) times daily. 08/19/21   Volney American, PA-C  pantoprazole (PROTONIX) 40 MG tablet Take 1 tablet (40 mg total) by mouth daily before breakfast. 09/01/20   Erenest Rasher, PA-C  Probiotic Product (PROBIOTIC DAILY PO) Take 1 each by mouth daily.    [provider]    Family History Family History  Problem Relation Age of Onset   Breast cancer Mother 1   Autism Sister        1 sister    Social History Social History   Tobacco Use   Smoking status: Every Day    Packs/day: 0.50    Years: 5.00    Total pack years: 2.50    Types: Cigarettes   Smokeless tobacco: Never   Tobacco comments:    smokes 1-2 cig daily  Vaping Use   Vaping Use: Never used  Substance Use Topics   Alcohol use: Yes    Comment: rarely   Drug use: No     Allergies   Other, Flagyl [metronidazole], and Tinidazole   Review of Systems Review of Systems Per HPI  Physical Exam Triage Vital Signs ED Triage Vitals  Enc Vitals Group     BP 09/13/21 1309 127/82     Pulse Rate 09/13/21 1309 94     Resp 09/13/21 1309 16     Temp 09/13/21 1309 98.1 F (36.7 C)     Temp Source 09/13/21 1309 Oral     SpO2 09/13/21 1309 97 %     Weight --      Height --      Head Circumference --      Peak Flow --       Pain Score 09/13/21 1311 8     Pain Loc --      Pain Edu? --      Excl. in [place]? --    No data found.  Updated Vital Signs BP 127/82 (BP Location: Right Arm)   Pulse 94   Temp 98.1 F (36.7 C) (Oral)   Resp 16   LMP  08/07/2021 (Approximate)   SpO2 97%   Visual Acuity Right Eye Distance:   Left Eye Distance:   Bilateral Distance:    Right Eye Near:   Left Eye Near:    Bilateral Near:     Physical Exam Vitals and nursing note reviewed.  Constitutional:      General: She is not in acute distress.    Appearance: Normal appearance.  Cardiovascular:     Rate and Rhythm: Normal rate and regular rhythm.     Pulses: Normal pulses.     Heart sounds: Normal heart sounds.  Pulmonary:     Effort: Pulmonary effort is normal.     Breath sounds: Normal breath sounds.  Abdominal:     General: Bowel sounds are normal.     Palpations: Abdomen is soft.  Musculoskeletal:     Left hand: Swelling (Left small finger.), laceration (Healing laceration measures approximately 0.75 cm in length to the lateral aspect of the left small finger at the MIP joint.  No warmth, foul-smelling drainage, bleeding, oozing, or fluctuance is present.  Localized erythema to the site.) and tenderness (left small finger) present. Decreased range of motion. Decreased strength (Due to pain in left small finger). Normal sensation. Normal capillary refill. Normal pulse.  Skin:    General: Skin is warm and dry.  Neurological:     General: No focal deficit present.     Mental Status: She is alert and oriented to person, place, and time.  Psychiatric:        Mood and Affect: Mood normal.        Behavior: Behavior normal.      UC Treatments / Results  Labs (all labs ordered are listed, but only abnormal results are displayed) Labs Reviewed - No data to display  EKG   Radiology DG Finger Little Left  Result Date: 09/13/2021 CLINICAL DATA:  Trauma, pain and swelling EXAM: LEFT LITTLE FINGER 2+V COMPARISON:   None Available. FINDINGS: No fracture or dislocation is seen. There are no opaque foreign bodies. IMPRESSION: No fracture or dislocation is seen in left fifth finger. Electronically Signed   By: Ernie Avena M.D.   On: 09/13/2021 13:25    Procedures Procedures (including critical care time)  Medications Ordered in UC Medications - No data to display  Initial Impression / Assessment and Plan / UC Course  I have reviewed the triage vital signs and the nursing notes.  Pertinent labs & imaging results that were available during my care of the patient were reviewed by me and considered in my medical decision making (see chart for details).  Patient presents with an injury to the left pinky finger after her hand was caught in her front door.  On exam, patient has a healing laceration to the lateral aspect of the left pinky finger.  Laceration measures approximately 0.75 cm in length.  There is scabbing over the wound.  No signs of infection are present.  X-rays are negative for fracture or dislocation of the left fifth finger.  Patient's last tetanus shot was in 2022.  Symptoms are consistent with a crush injury due to the swelling and laceration suffered.  Patient was advised to continue use of the antibiotic ointment she is using to the left fifth finger.  Also encourage patient to use ice to help with swelling.  Supportive care recommendations were provided to the patient.  Patient verbalizes understanding.  All questions were answered. Final Clinical Impressions(s) / UC Diagnoses   Final diagnoses:  Crushing injury of finger of left hand  Laceration of left little finger without foreign body without damage to nail, initial encounter     Discharge Instructions      Continue to apply antibiotic ointment to the area twice daily while symptoms persist. Apply ice to help with pain and swelling.  Apply for 20 minutes, remove for 1 hour, then repeat. Gentle stretching and range of motion  exercises with the left pinky finger. May take over-the-counter Tylenol or ibuprofen as needed for pain or discomfort. Follow-up if you develop fever, chills, foul-smelling drainage, increasing redness, or streaking into the hand that starts at the left pinky finger. Follow-up as needed.     ED Prescriptions   None    PDMP not reviewed this encounter.   Tish Men, NP 09/13/21 1357

## 2021-09-13 NOTE — ED Triage Notes (Signed)
Pt reports pain and swelling in left pinky finger after the finger got stuck in the door 3 days ago.

## 2021-09-13 NOTE — Discharge Instructions (Signed)
Continue to apply antibiotic ointment to the area twice daily while symptoms persist. Apply ice to help with pain and swelling.  Apply for 20 minutes, remove for 1 hour, then repeat. Gentle stretching and range of motion exercises with the left pinky finger. May take over-the-counter Tylenol or ibuprofen as needed for pain or discomfort. Follow-up if you develop fever, chills, foul-smelling drainage, increasing redness, or streaking into the hand that starts at the left pinky finger. Follow-up as needed.

## 2021-11-21 ENCOUNTER — Ambulatory Visit
Admission: EM | Admit: 2021-11-21 | Discharge: 2021-11-21 | Disposition: A | Discharge status: Home / Self Care | Payer: Medicaid Other | Attending: Physician Assistant | Admitting: Physician Assistant

## 2021-11-21 DIAGNOSIS — S0992XA Unspecified injury of nose, initial encounter: Secondary | ICD-10-CM

## 2021-11-21 DIAGNOSIS — K148 Other diseases of tongue: Secondary | ICD-10-CM

## 2021-11-21 MED ORDER — BENZOCAINE 20 % MT PSTE: PASTE | Freq: Four times a day (QID) | OROMUCOSAL | Status: DC | PRN

## 2021-11-21 MED ORDER — BENZOCAINE 20 % MT PSTE
1.0000 | PASTE | Freq: Four times a day (QID) | OROMUCOSAL | 0 refills | Status: DC | PRN
Start: 2021-11-21 — End: 2022-03-13
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
## 2021-11-21 NOTE — Discharge Instructions (Addendum)
Use Orabase 4 times daily.  Keep this area clean by using Listerine and increasing frequency of brushing.  If your symptoms are not improving quickly you should follow-up with a dentist to ensure this is not a precancerous lesion.  If anything changes please return for reevaluation.  Unfortunately, we are unable to obtain imaging of your nerves today.  Please go to outpatient imaging center tomorrow to have these images taken.  We will contact you with the results.  Use ice to encourage improvement of swelling.  Alternate Tylenol ibuprofen for pain.  I would recommend following up with ENT; call to schedule an appointment.  If anything changes or worsens and you have significant swelling, difficulty breathing, bleeding from your nose you need to go to the emergency room.

## 2021-11-21 NOTE — ED Triage Notes (Signed)
Tongue,  throat and nose swelling says it doesn't feel like step throat. States there may be a bump on side on tongue. Been there for a week and has not gotten better.

## 2021-12-14 ENCOUNTER — Ambulatory Visit
Admission: EM | Admit: 2021-12-14 | Discharge: 2021-12-14 | Disposition: A | Discharge status: Home / Self Care | Payer: Medicaid Other

## 2021-12-14 DIAGNOSIS — R21 Rash and other nonspecific skin eruption: Secondary | ICD-10-CM

## 2021-12-14 MED ORDER — CLINDAMYCIN PHOS-BENZOYL PEROX 1.2-5 % EX GEL
1.0000 | Freq: Two times a day (BID) | CUTANEOUS | 0 refills | Status: DC
Start: 2021-12-14 — End: 2023-08-28

## 2021-12-14 NOTE — Discharge Instructions (Signed)
Use medication as prescribed. Continue to keep the area clean and dry.  As discussed, may cleanse the area with topical astringents such as Neutrogena. Do not pick or disrupt the area while symptoms persist. Continue to apply ice to the nose and bridge of the nose to help with pain and swelling. Follow-up as needed.
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
## 2021-12-15 ENCOUNTER — Encounter: Payer: Medicaid Other | Admitting: Adult Health

## 2021-12-20 IMAGING — US US BREAST*R* LIMITED INC AXILLA
1 series · 4 of 4 positions shown · non-contrast
Comparison: None.

CLINICAL DATA: 25-year-old female with severe bilateral nipple
soreness for the past several weeks.

EXAM:
ULTRASOUND OF THE BILATERAL BREAST

[Series 1: us breast*right* limited inc axilla · 0.08mm/px · 4 of 4 slices shown]
[im 1/4]
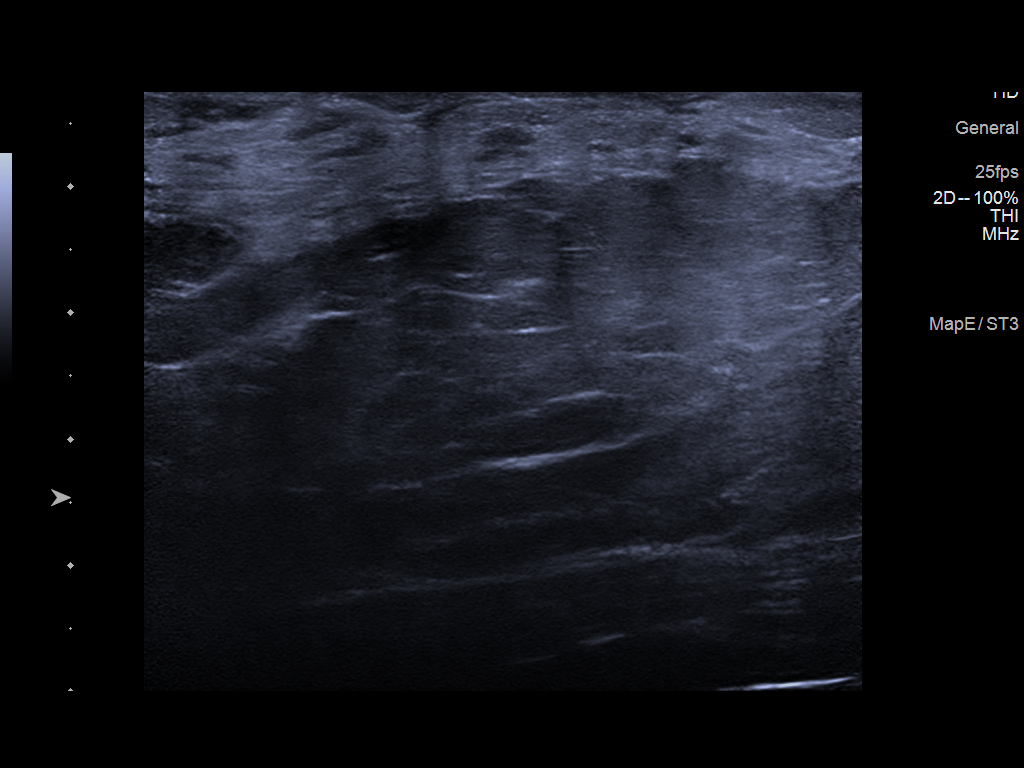
[im 2/4]
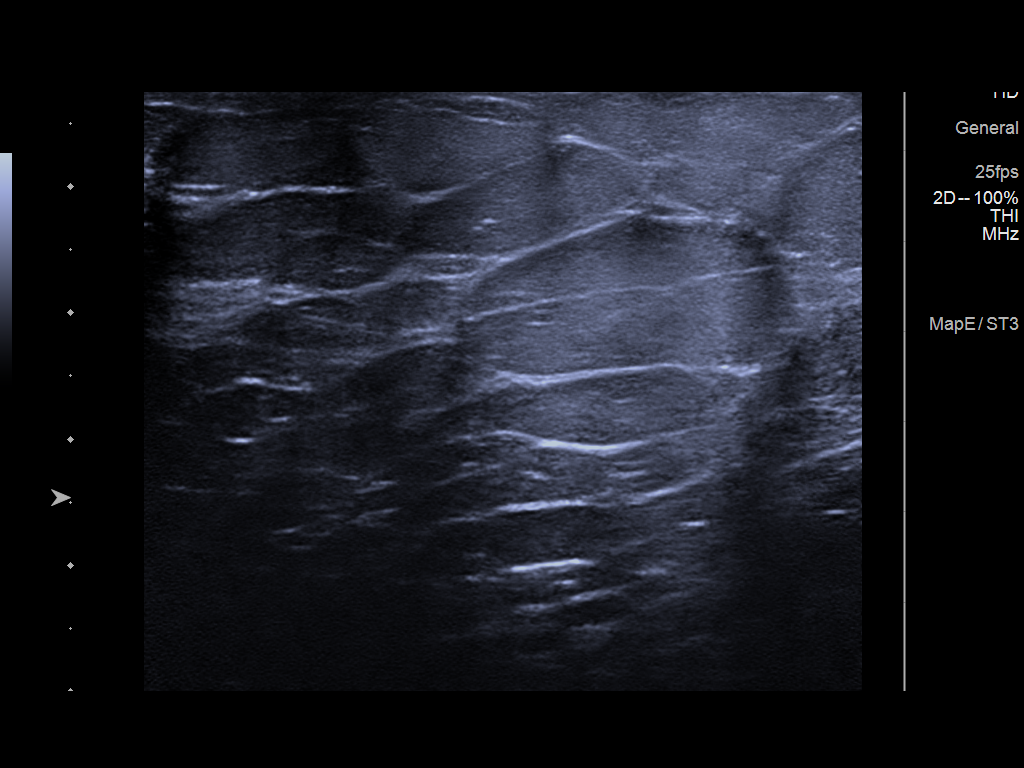
[im 3/4]
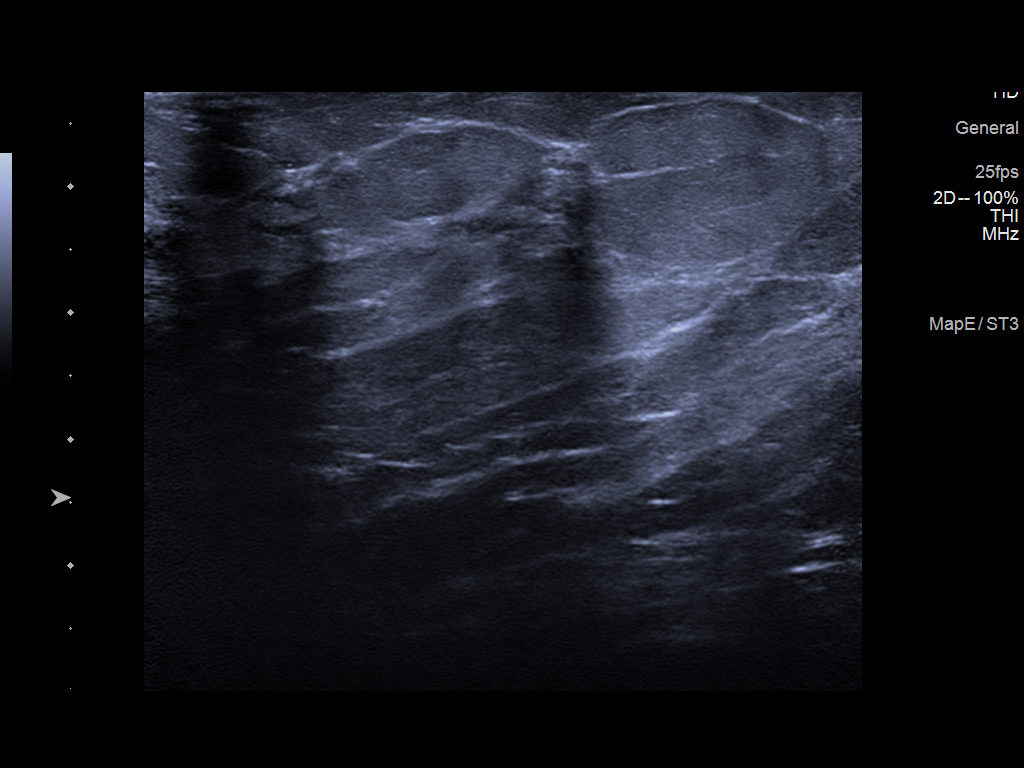
[im 4/4]
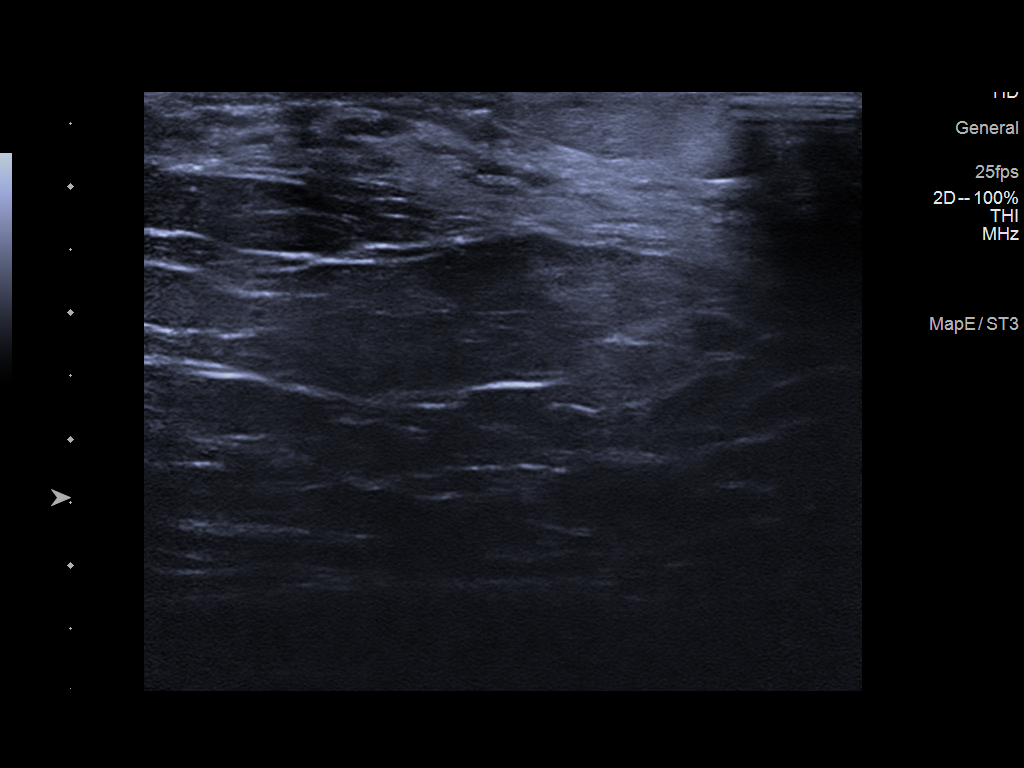

[4 of 4 positions shown; findings below may reference images not displayed]

FINDINGS: Targeted ultrasound of the central/retroareolar bilateral breasts
was performed. No suspicious masses or abnormality seen. No
intraductal masses or abnormally dilated ducts.
IMPRESSION: No sonographic abnormalities to account for bilateral nipple
soreness. This is likely hormonal.

RECOMMENDATION:
1. Recommend further management of nipple soreness be based on
clinical assessment.

2. Patient has a reported strong family history of breast cancer
with mother diagnosed with breast cancer likely in her early 30s.
Consider genetic counseling if this has not already been performed.
The American Cancer Society recommends annual MRI and mammography
(with mammography not to begin before age 30) in patients with an
estimated lifetime risk of developing breast cancer greater than 20
- 25%, or who are known or suspected to be positive for the breast
cancer gene.

I have discussed the findings and recommendations with the patient.
If applicable, a reminder letter will be sent to the patient
regarding the next appointment.

BI-RADS CATEGORY  1: Negative.

## 2022-01-10 ENCOUNTER — Ambulatory Visit
Admission: EM | Admit: 2022-01-10 | Discharge: 2022-01-10 | Disposition: A | Discharge status: Home / Self Care | Payer: Medicaid Other

## 2022-01-10 DIAGNOSIS — J069 Acute upper respiratory infection, unspecified: Secondary | ICD-10-CM | POA: Diagnosis not present

## 2022-01-10 MED ORDER — FLUTICASONE PROPIONATE 50 MCG/ACT NA SUSP: 1.0000 | Freq: Two times a day (BID) | NASAL | 2 refills | Status: DC

## 2022-01-10 MED ORDER — DEXAMETHASONE SODIUM PHOSPHATE 10 MG/ML IJ SOLN
10.0000 mg | Freq: Once | INTRAMUSCULAR | Status: AC
  Administered 2022-01-10: 10 mg via INTRAMUSCULAR

## 2022-01-10 NOTE — ED Triage Notes (Signed)
Pt reports Saturday she developed a runny nose, sore throat, itchy eyes, headache,  coughing up flem, both ears clogged. Took prednisone which was her  last one and now she feels worst.

## 2022-01-10 NOTE — ED Provider Notes (Signed)
[facility]    CSN: [id number] Arrival date & time: 01/10/22  1417      History   Chief Complaint Chief Complaint  Patient presents with   Cough    HPI Kimberly Norris is a 26 y.o. female.   Presenting today with 5 to 6-day history of runny nose, sore throat, itchy eyes, headache, productive cough, bilateral ear pressure.  Was placed on a short course of prednisone by PCP last week for middle ear effusions and sinus pressure but states symptoms worsened over the weekend despite this.  Denies fever, chills, body aches, chest pain, shortness of breath.  Numerous sick contacts at home with similar symptoms.  Not tried anything over-the-counter for symptoms.    Past Medical History:  Diagnosis Date   Bilateral chronic knee pain 04/16/2012   GERD (gastroesophageal reflux disease)    Heartburn    Liver disease    fatty liver   Mental disorder    anxiety, depression   Rhabdomyolysis     Patient Active Problem List   Diagnosis Date Noted   Screening examination for STD (sexually transmitted disease) 01/31/2021   Rectal bleeding 12/02/2020   Grade III hemorrhoids 11/16/2020   Family history of breast cancer in mother 11/03/2020   Nipple pain 11/03/2020   Nexplanon in place 11/03/2020   Encounter for well woman exam with routine gynecological exam 11/03/2020   Adjustment disorder with emotional disturbance    Self-injurious behavior    Dysphagia 04/29/2020   Abdominal wall bulge 04/29/2020   Alternating constipation and diarrhea 03/17/2020   RUQ abdominal pain 03/17/2020   Anal fissure 02/17/2020   Skin tag of perianal region 02/17/2020   Cold sore 12/18/2019   Screen for STD (sexually transmitted disease) 12/18/2019   Urinary frequency 09/16/2019   Vaginal itching 09/16/2019   Nausea without vomiting 08/18/2019   Gastroesophageal reflux disease 08/18/2019   Loss of weight 08/18/2019   Fatty liver 08/18/2019   Early satiety 08/18/2019   Encounter for  gynecological examination with Papanicolaou smear of cervix 07/25/2018   Traumatic rhabdomyolysis ([place]) 07/12/2015   Rhabdomyolysis 07/12/2015   BMI (body mass index), pediatric, 95-99% for age 19/16/2014   Acquired genu valgum, 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  All pathology was benign.    HEMORRHOID SURGERY N/A 12/01/2020   Procedure: HEMORRHOIDECTOMY; SIMPLE;  Surgeon: Virl Cagey, MD;  Location: [facility];  Service: General;  Laterality: N/A;   NO PAST SURGERIES      OB History     Gravida  0   Para  0   Term  0   Preterm  0   AB  0   Living  0      SAB  0   IAB  0   Ectopic  0   Multiple  0   Live Births  0            Home Medications    Prior to Admission medications   Medication Sig Start Date End Date Taking? Authorizing Provider  fluticasone (FLONASE) 50  MCG/ACT nasal spray Place 1 spray into both nostrils 2 (two) times daily. 01/10/22  Yes Volney American, PA-C  pantoprazole (PROTONIX) 40 MG tablet 1 tablet Orally Once a day for 30 day(s) 01/03/22  Yes [provider]  albuterol (VENTOLIN HFA) 108 (90 Base) MCG/ACT inhaler Inhale 2 puffs into the lungs every 6 (six) hours as needed for wheezing or shortness of breath. 07/28/21   Leath-Warren, Alda Lea, NP  benzocaine (ORABASE-B) 20 % PSTE Use as directed 1 Application in the mouth or throat 4 (four) times daily as needed for mouth pain. 11/21/21   Raspet, Erin K, PA-C   brompheniramine-pseudoephedrine-DM 30-2-10 MG/5ML syrup Take 5 mLs by mouth 4 (four) times daily as needed. 06/06/21   Leath-Warren, Alda Lea, NP  calcium carbonate (TUMS - DOSED IN MG ELEMENTAL CALCIUM) 500 MG chewable tablet Chew 1 tablet by mouth daily as needed for heartburn. As needed    [provider]  cetirizine-pseudoephedrine (ZYRTEC-D) 5-120 MG tablet Take 1 tablet by mouth 2 (two) times daily. 06/06/21   Leath-Warren, Alda Lea, NP  Clindamycin-Benzoyl Per, Refr, gel Apply 1 Application topically 2 (two) times daily. 12/14/21   Leath-Warren, Alda Lea, NP  etonogestrel (NEXPLANON) 68 MG IMPL implant Inject 68 mg into the skin once.     [provider]  famotidine (PEPCID) 20 MG tablet Take 20 mg by mouth 2 (two) times daily.    [provider]  fluconazole (DIFLUCAN) 150 MG tablet Take 1 now and 1 in 3 days 02/04/21   Estill Dooms, NP  Metamucil Fiber CHEW Chew 2 each by mouth daily.    [provider]  pantoprazole (PROTONIX) 40 MG tablet Take 1 tablet (40 mg total) by mouth daily before breakfast. 09/01/20   Erenest Rasher, PA-C  Probiotic Product (PROBIOTIC DAILY PO) Take 1 each by mouth daily.    [provider]    Family History Family History  Problem Relation Age of Onset   Breast cancer Mother 62   Autism Sister        1 sister    Social History Social History   Tobacco Use   Smoking status: Every Day    Packs/day: 0.50    Years: 5.00    Total pack years: 2.50    Types: Cigarettes   Smokeless tobacco: Never   Tobacco comments:    smokes 1-2 cig daily  Vaping Use   Vaping Use: Never used  Substance Use Topics   Alcohol use: Yes    Comment: rarely   Drug use: No     Allergies   Other, Flagyl [metronidazole], and Tinidazole   Review of Systems Review of Systems PER HPI  Physical Exam Triage Vital Signs ED Triage Vitals [01/10/22 1756]  Enc Vitals Group     BP 120/66     Pulse Rate 71      Resp      Temp 98.5 F (36.9 C)     Temp Source Oral     SpO2 97 %     Weight      Height      Head Circumference      Peak Flow      Pain Score 6     Pain Loc      Pain Edu?      Excl. in [place]?    No data found.  Updated Vital Signs BP 120/66 (BP Location: Right Arm)   Pulse 71   Temp 98.5 F (  36.9 C) (Oral)   LMP  (LMP Unknown) Comment: unknown due to Bc . Per pt she cant recall  SpO2 97%   Visual Acuity Right Eye Distance:   Left Eye Distance:   Bilateral Distance:    Right Eye Near:   Left Eye Near:    Bilateral Near:     Physical Exam Vitals and nursing note reviewed.  Constitutional:      Appearance: Normal appearance.  HENT:     Head: Atraumatic.     Right Ear: Tympanic membrane and external ear normal.     Left Ear: Tympanic membrane and external ear normal.     Nose: Rhinorrhea present.     Mouth/Throat:     Mouth: Mucous membranes are moist.     Pharynx: Posterior oropharyngeal erythema present.  Eyes:     Extraocular Movements: Extraocular movements intact.     Conjunctiva/sclera: Conjunctivae normal.  Cardiovascular:     Rate and Rhythm: Normal rate and regular rhythm.     Heart sounds: Normal heart sounds.  Pulmonary:     Effort: Pulmonary effort is normal.     Breath sounds: No wheezing or rales.  Musculoskeletal:        General: Normal range of motion.     Cervical back: Normal range of motion and neck supple.  Skin:    General: Skin is warm and dry.  Neurological:     Mental Status: She is alert and oriented to person, place, and time.  Psychiatric:        Mood and Affect: Mood normal.        Thought Content: Thought content normal.      UC Treatments / Results  Labs (all labs ordered are listed, but only abnormal results are displayed) Labs Reviewed - No data to display  EKG   Radiology No results found.  Procedures Procedures (including critical care time)  Medications Ordered in UC Medications  dexamethasone  (DECADRON) injection 10 mg (has no administration in time range)    Initial Impression / Assessment and Plan / UC Course  I have reviewed the triage vital signs and the nursing notes.  Pertinent labs & imaging results that were available during my care of the patient were reviewed by me and considered in my medical decision making (see chart for details).     Vitals and exam reassuring, suggestive of a viral upper respiratory infection.  Treat with IM Decadron for worsening symptoms, Flonase, discussed supportive over-the-counter medications and home care additionally.  Return for worsening symptoms.  Final Clinical Impressions(s) / UC Diagnoses   Final diagnoses:  Viral URI with cough   Discharge Instructions   None    ED Prescriptions     Medication Sig Dispense Auth. Provider   fluticasone (FLONASE) 50 MCG/ACT nasal spray Place 1 spray into both nostrils 2 (two) times daily. 16 g Particia Nearing, [place]      PDMP not reviewed this encounter.   Particia Nearing, [place] 01/10/22 1842

## 2022-01-16 ENCOUNTER — Encounter: Payer: Self-pay | Admitting: Emergency Medicine

## 2022-01-16 ENCOUNTER — Ambulatory Visit
Admission: EM | Admit: 2022-01-16 | Discharge: 2022-01-16 | Disposition: A | Discharge status: Home / Self Care | Payer: Medicaid Other | Attending: Family Medicine | Admitting: Family Medicine

## 2022-01-16 DIAGNOSIS — H66002 Acute suppurative otitis media without spontaneous rupture of ear drum, left ear: Secondary | ICD-10-CM

## 2022-01-16 MED ORDER — AMOXICILLIN 875 MG PO TABS: 875.0000 mg | ORAL_TABLET | Freq: Two times a day (BID) | ORAL | 0 refills | Status: DC

## 2022-01-16 NOTE — ED Provider Notes (Signed)
[facility]    CSN: [id number] Arrival date & time: 01/16/22  1228      History   Chief Complaint Chief Complaint  Patient presents with   Otalgia    HPI Kimberly Norris is a 27 y.o. female.   Patient presenting today with ongoing nasal congestion and progressively worsening ear pain worse on the left for over a week now since having an upper respiratory infection.  Was seen at this urgent care, given a steroid shot which did help symptoms but has now worn off and has been taking Flonase with minimal relief.  Denies fever, chills, drainage from the ear, headache, nausea, vomiting, chest pain, shortness of breath.    Past Medical History:  Diagnosis Date   Bilateral chronic knee pain 04/16/2012   GERD (gastroesophageal reflux disease)    Heartburn    Liver disease    fatty liver   Mental disorder    anxiety, depression   Rhabdomyolysis     Patient Active Problem List   Diagnosis Date Noted   Screening examination for STD (sexually transmitted disease) 01/31/2021   Rectal bleeding 12/02/2020   Grade III hemorrhoids 11/16/2020   Family history of breast cancer in mother 11/03/2020   Nipple pain 11/03/2020   Nexplanon in place 11/03/2020   Encounter for well woman exam with routine gynecological exam 11/03/2020   Adjustment disorder with emotional disturbance    Self-injurious behavior    Dysphagia 04/29/2020   Abdominal wall bulge 04/29/2020   Alternating constipation and diarrhea 03/17/2020   RUQ abdominal pain 03/17/2020   Anal fissure 02/17/2020   Skin tag of perianal region 02/17/2020   Cold sore 12/18/2019   Screen for STD (sexually transmitted disease) 12/18/2019   Urinary frequency 09/16/2019   Vaginal itching 09/16/2019   Nausea without vomiting 08/18/2019   Gastroesophageal reflux disease 08/18/2019   Loss of weight 08/18/2019   Fatty liver 08/18/2019   Early satiety 08/18/2019   Encounter for gynecological examination with Papanicolaou  smear of cervix 07/25/2018   Traumatic rhabdomyolysis ([place]) 07/12/2015   Rhabdomyolysis 07/12/2015   BMI (body mass index), pediatric, 95-99% for age 03/31/2012   Acquired genu valgum, 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  All pathology was benign.    HEMORRHOID SURGERY N/A 12/01/2020   Procedure: HEMORRHOIDECTOMY; SIMPLE;  Surgeon: Virl Cagey, MD;  Location: [facility];  Service: General;  Laterality: N/A;   NO PAST SURGERIES      OB History     Gravida  0   Para  0   Term  0   Preterm  0   AB  0   Living  0      SAB  0   IAB  0   Ectopic  0   Multiple  0   Live Births  0            Home Medications    Prior to Admission medications   Medication Sig Start Date End Date Taking? Authorizing Provider  amoxicillin (AMOXIL) 875 MG tablet Take 1 tablet (  875 mg total) by mouth 2 (two) times daily. 01/16/22  Yes Particia Nearing, PA-C  albuterol (VENTOLIN HFA) 108 (90 Base) MCG/ACT inhaler Inhale 2 puffs into the lungs every 6 (six) hours as needed for wheezing or shortness of breath. 07/28/21   Leath-Warren, Sadie Haber, NP  benzocaine (ORABASE-B) 20 % PSTE Use as directed 1 Application in the mouth or throat 4 (four) times daily as needed for mouth pain. 11/21/21   Raspet, Erin K, PA-C  brompheniramine-pseudoephedrine-DM 30-2-10 MG/5ML syrup Take 5 mLs by mouth 4 (four) times daily as needed. 06/06/21   Leath-Warren, Sadie Haber, NP  calcium carbonate (TUMS - DOSED IN MG  ELEMENTAL CALCIUM) 500 MG chewable tablet Chew 1 tablet by mouth daily as needed for heartburn. As needed    [provider]  cetirizine-pseudoephedrine (ZYRTEC-D) 5-120 MG tablet Take 1 tablet by mouth 2 (two) times daily. 06/06/21   Leath-Warren, Sadie Haber, NP  Clindamycin-Benzoyl Per, Refr, gel Apply 1 Application topically 2 (two) times daily. 12/14/21   Leath-Warren, Sadie Haber, NP  etonogestrel (NEXPLANON) 68 MG IMPL implant Inject 68 mg into the skin once.     [provider]  famotidine (PEPCID) 20 MG tablet Take 20 mg by mouth 2 (two) times daily.    [provider]  fluconazole (DIFLUCAN) 150 MG tablet Take 1 now and 1 in 3 days 02/04/21   Adline Potter, NP  fluticasone (FLONASE) 50 MCG/ACT nasal spray Place 1 spray into both nostrils 2 (two) times daily. 01/10/22   Particia Nearing, PA-C  Metamucil Fiber CHEW Chew 2 each by mouth daily.    [provider]  pantoprazole (PROTONIX) 40 MG tablet Take 1 tablet (40 mg total) by mouth daily before breakfast. 09/01/20   Letta Median, PA-C  pantoprazole (PROTONIX) 40 MG tablet 1 tablet Orally Once a day for 30 day(s) 01/03/22   [provider]  Probiotic Product (PROBIOTIC DAILY PO) Take 1 each by mouth daily.    [provider]    Family History Family History  Problem Relation Age of Onset   Breast cancer Mother 65   Autism Sister        1 sister    Social History Social History   Tobacco Use   Smoking status: Every Day    Packs/day: 0.50    Years: 5.00    Total pack years: 2.50    Types: Cigarettes   Smokeless tobacco: Never   Tobacco comments:    smokes 1-2 cig daily  Vaping Use   Vaping Use: Never used  Substance Use Topics   Alcohol use: Yes    Comment: rarely   Drug use: No     Allergies   Other, Flagyl [metronidazole], and Tinidazole   Review of Systems Review of Systems Per HPI  Physical Exam Triage Vital Signs ED Triage Vitals  [01/16/22 1530]  Enc Vitals Group     BP 135/89     Pulse Rate 87     Resp 20     Temp 98 F (36.7 C)     Temp Source Oral     SpO2 98 %     Weight      Height      Head Circumference      Peak Flow      Pain Score 8     Pain Loc      Pain Edu?      Excl. in [place]?    No data  found.  Updated Vital Signs BP 135/89 (BP Location: Right Arm)   Pulse 87   Temp 98 F (36.7 C) (Oral)   Resp 20   LMP  (LMP Unknown) Comment: unknown due to Bc . Per pt she cant recall  SpO2 98%   Visual Acuity Right Eye Distance:   Left Eye Distance:   Bilateral Distance:    Right Eye Near:   Left Eye Near:    Bilateral Near:     Physical Exam Vitals and nursing note reviewed.  Constitutional:      Appearance: Normal appearance.  HENT:     Head: Atraumatic.     Right Ear: External ear normal.     Left Ear: External ear normal.     Ears:     Comments: Left TM erythematous, edematous, right TM with middle ear effusion    Nose: Nose normal.     Mouth/Throat:     Mouth: Mucous membranes are moist.     Pharynx: Posterior oropharyngeal erythema present.  Eyes:     Extraocular Movements: Extraocular movements intact.     Conjunctiva/sclera: Conjunctivae normal.  Cardiovascular:     Rate and Rhythm: Normal rate and regular rhythm.     Heart sounds: Normal heart sounds.  Pulmonary:     Effort: Pulmonary effort is normal.     Breath sounds: Normal breath sounds. No wheezing.  Musculoskeletal:        General: Normal range of motion.     Cervical back: Normal range of motion and neck supple.  Skin:    General: Skin is warm and dry.  Neurological:     Mental Status: She is alert and oriented to person, place, and time.  Psychiatric:        Mood and Affect: Mood normal.        Thought Content: Thought content normal.    UC Treatments / Results  Labs (all labs ordered are listed, but only abnormal results are displayed) Labs Reviewed - No data to display  EKG   Radiology No  results found.  Procedures Procedures (including critical care time)  Medications Ordered in UC Medications - No data to display  Initial Impression / Assessment and Plan / UC Course  I have reviewed the triage vital signs and the nursing notes.  Pertinent labs & imaging results that were available during my care of the patient were reviewed by me and considered in my medical decision making (see chart for details).     Treat with amoxicillin for left ear infection, continue Flonase, decongestants, supportive over-the-counter medications and home care.  Return for worsening symptoms.  Final Clinical Impressions(s) / UC Diagnoses   Final diagnoses:  Acute suppurative otitis media of left ear without spontaneous rupture of tympanic membrane, recurrence not specified   Discharge Instructions   None    ED Prescriptions     Medication Sig Dispense Auth. Provider   amoxicillin (AMOXIL) 875 MG tablet Take 1 tablet (875 mg total) by mouth 2 (two) times daily. 20 tablet Volney American, [place]      PDMP not reviewed this encounter.   Volney American, [place] 01/16/22 1718

## 2022-01-16 NOTE — ED Triage Notes (Signed)
Pt reports bilateral ear pain for over a week. States she used Psychologist, sport and exercise

## 2022-01-20 ENCOUNTER — Encounter: Payer: Medicaid Other | Admitting: Adult Health

## 2022-01-23 ENCOUNTER — Ambulatory Visit: Payer: Medicaid Other | Admitting: Advanced Practice Midwife

## 2022-01-23 ENCOUNTER — Encounter: Payer: Self-pay | Admitting: Advanced Practice Midwife

## 2022-01-23 VITALS — BP 121/82 | HR 89 | Ht 61.0 in | Wt 206.4 lb

## 2022-01-23 DIAGNOSIS — Z3046 Encounter for surveillance of implantable subdermal contraceptive: Secondary | ICD-10-CM

## 2022-01-23 DIAGNOSIS — Z3202 Encounter for pregnancy test, result negative: Secondary | ICD-10-CM

## 2022-01-23 DIAGNOSIS — Z975 Presence of (intrauterine) contraceptive device: Secondary | ICD-10-CM

## 2022-01-23 DIAGNOSIS — Z30017 Encounter for initial prescription of implantable subdermal contraceptive: Secondary | ICD-10-CM

## 2022-01-23 LAB — POCT URINE PREGNANCY: Preg Test, Ur: NEGATIVE

## 2022-01-23 MED ORDER — ETONOGESTREL 68 MG ~~LOC~~ IMPL
68.0000 mg | DRUG_IMPLANT | Freq: Once | SUBCUTANEOUS | Status: AC
  Administered 2022-01-23: 68 mg via SUBCUTANEOUS

## 2022-01-23 NOTE — Addendum Note (Signed)
Addended by: Diona Fanti A on: 01/23/2022 04:33 PM   Modules accepted: Orders

## 2022-01-23 NOTE — Progress Notes (Signed)
Kimberly Norris 26 y.o. Vitals:   01/23/22 1428  BP: 121/82  Pulse: 89    Past Medical History:  Diagnosis Date   Bilateral chronic knee pain 04/16/2012   GERD (gastroesophageal reflux disease)    Heartburn    Liver disease    fatty liver   Mental disorder    anxiety, depression   Rhabdomyolysis     Family History  Problem Relation Age of Onset   Breast cancer Mother 56   Autism Sister        1 sister    Social History   Socioeconomic History   Marital status: Single    Spouse name: Not on file   Number of children: Not on file   Years of education: Not on file   Highest education level: Not on file  Occupational History   Not on file  Tobacco Use   Smoking status: Every Day    Packs/day: 0.50    Years: 5.00    Total pack years: 2.50    Types: Cigarettes   Smokeless tobacco: Never   Tobacco comments:    smokes 1-2 cig daily  Vaping Use   Vaping Use: Never used  Substance and Sexual Activity   Alcohol use: Yes    Comment: rarely   Drug use: No   Sexual activity: Yes    Birth control/protection: Implant  Other Topics Concern   Not on file  Social History Narrative   Not on file   Social Determinants of Health   Financial Resource Strain: Medium Risk (11/03/2020)   Overall Financial Resource Strain (CARDIA)    Difficulty of Paying Living Expenses: Somewhat hard  Food Insecurity: No Food Insecurity (11/03/2020)   Hunger Vital Sign    Worried About Running Out of Food in the Last Year: Never true    Ran Out of Food in the Last Year: Never true  Transportation Needs: No Transportation Needs (11/03/2020)   PRAPARE - Hydrologist (Medical): No    Lack of Transportation (Non-Medical): No  Physical Activity: Inactive (11/03/2020)   Exercise Vital Sign    Days of Exercise per Week: 0 days    Minutes of Exercise per Session: 0 min  Stress: Unknown (11/03/2020)   [place]    Feeling of Stress : Patient refused  Social Connections: Socially Isolated (11/03/2020)   Social Connection and Isolation Panel [NHANES]    Frequency of Communication with Friends and Family: More than three times a week    Frequency of Social Gatherings with Friends and Family: Once a week    Attends Religious Services: Never    Marine scientist or Organizations: No    Attends Archivist Meetings: Never    Marital Status: Never married  Intimate Partner Violence: Not At Risk (11/03/2020)   Humiliation, Afraid, Rape, and Kick questionnaire    Fear of Current or Ex-Partner: No    Emotionally Abused: No    Physically Abused: No    Sexually Abused: No    HPI:  Kimberly Norris is here for Nexplanon removal and reinsertion.  She was given informed consent for removal of her Nexplanon and reinsertion of a new one.A signed copy is in the chart.  Appropriate time out taken. Nexlanon site (left arm) identified and thea area was prepped in usual sterile fashon. 2 cc of 1% lidocaine was used to anesthetize the area starting with the distal  end of the implant. A small stab incision was made right beside the implant on the distal portion.  The Nexplanon rod was grasped using hemostats and removed without difficulty.  There was less than 3 cc blood loss. There were no complications. Next, the area was cleansed again and the new Nexplanon was inserted without difficulty.  Steri strips and a pressure bandage were applied.  Pt was instructed to remove pressure bandage in a few hours, and keep insertion site covered with a bandaid for 3 days.  Follow-up scheduled PRN problems  CRESENZO-DISHMAN,Hydia Copelin 01/23/2022 3:16 PM

## 2022-01-23 NOTE — Addendum Note (Signed)
Addended by: Diona Fanti A on: 01/23/2022 04:14 PM   Modules accepted: Orders

## 2022-01-24 ENCOUNTER — Encounter: Payer: Self-pay | Admitting: Advanced Practice Midwife

## 2022-02-06 ENCOUNTER — Encounter: Payer: Medicaid Other | Admitting: Advanced Practice Midwife

## 2022-02-08 ENCOUNTER — Encounter: Payer: Medicaid Other | Admitting: Advanced Practice Midwife

## 2022-02-14 ENCOUNTER — Encounter: Payer: Self-pay | Admitting: Emergency Medicine

## 2022-02-14 ENCOUNTER — Ambulatory Visit
Admission: EM | Admit: 2022-02-14 | Discharge: 2022-02-14 | Disposition: A | Discharge status: Home / Self Care | Payer: Medicaid Other | Attending: Nurse Practitioner | Admitting: Nurse Practitioner

## 2022-02-14 DIAGNOSIS — N3000 Acute cystitis without hematuria: Secondary | ICD-10-CM | POA: Diagnosis present

## 2022-02-14 LAB — POCT URINALYSIS DIP (MANUAL ENTRY)
Bilirubin, UA: NEGATIVE
Blood, UA: NEGATIVE
Glucose, UA: NEGATIVE mg/dL
Ketones, POC UA: NEGATIVE mg/dL
Nitrite, UA: NEGATIVE
Protein Ur, POC: 30 mg/dL — AB
Spec Grav, UA: 1.025 (ref 1.010–1.025)
Urobilinogen, UA: 0.2 E.U./dL
pH, UA: 7 (ref 5.0–8.0)

## 2022-02-14 MED ORDER — NITROFURANTOIN MONOHYD MACRO 100 MG PO CAPS: 100.0000 mg | ORAL_CAPSULE | Freq: Two times a day (BID) | ORAL | 0 refills | Status: AC

## 2022-02-14 NOTE — Discharge Instructions (Signed)
As we discussed, it sounds like you may have a urinary tract infection.  Please take the Macrobid as prescribed to treat it.  Will call you later this week if the urine culture shows we need to change antibiotic therapy or stop it.  Please make sure you are drinking more water to see if this helps with the urinary odor.

## 2022-02-14 NOTE — ED Provider Notes (Signed)
[facility]    CSN: [id number] Arrival date & time: 02/14/22  1505      History   Chief Complaint No chief complaint on file.   HPI Kimberly Norris is a 27 y.o. female.   Patient presents today for 3-day history of frequent urination and irritation where she urinates when she wipes.  No dysuria, urinary urgency, or voiding smaller amounts.  No new urinary incontinence.  Reports she has noticed her urine odor is stronger the past few months.  No hematuria, abdominal pain, or new back pain.  No suprapubic pain or pressure, fever, or new nausea/vomiting.  No flank pain.  Patient is currently not sexually active.  Denies vaginal discharge.  No pelvic pain or swelling in the groin.  No vaginal sores or lesions.  Not concerned about STI today.  Has not taken thing for symptoms so far.  Patient is confident she is not pregnant.  Reports she had Nexplanon removed 2 weeks ago and has not had a period yet.    Past Medical History:  Diagnosis Date   Bilateral chronic knee pain 04/16/2012   GERD (gastroesophageal reflux disease)    Heartburn    Liver disease    fatty liver   Mental disorder    anxiety, depression   Rhabdomyolysis     Patient Active Problem List   Diagnosis Date Noted   Rectal bleeding 12/02/2020   Grade III hemorrhoids 11/16/2020   Family history of breast cancer in mother 11/03/2020   Nipple pain 11/03/2020   Nexplanon in place 11/03/2020   Adjustment disorder with emotional disturbance    Self-injurious behavior    Dysphagia 04/29/2020   Abdominal wall bulge 04/29/2020   Alternating constipation and diarrhea 03/17/2020   RUQ abdominal pain 03/17/2020   Anal fissure 02/17/2020   Skin tag of perianal region 02/17/2020   Cold sore 12/18/2019   Gastroesophageal reflux disease 08/18/2019   Fatty liver 08/18/2019   Traumatic rhabdomyolysis ([place]) 07/12/2015   Rhabdomyolysis 07/12/2015   Acquired genu valgum, 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  All pathology was benign.    HEMORRHOID SURGERY N/A 12/01/2020   Procedure: HEMORRHOIDECTOMY; SIMPLE;  Surgeon: Virl Cagey, MD;  Location: [facility];  Service: General;  Laterality: N/A;   NO PAST SURGERIES      OB History     Gravida  0   Para  0   Term  0   Preterm  0   AB  0   Living  0      SAB  0   IAB  0   Ectopic  0   Multiple  0   Live Births  0            Home Medications    Prior to Admission medications   Medication Sig Start Date End Date Taking? Authorizing Provider  nitrofurantoin, macrocrystal-monohydrate, (MACROBID) 100 MG capsule Take 1 capsule (100 mg total) by mouth 2 (two) times daily for 5 days. 02/14/22 02/19/22 Yes Eulogio Bear, NP  albuterol (VENTOLIN HFA)  108 (90 Base) MCG/ACT inhaler Inhale 2 puffs into the lungs every 6 (six) hours as needed for wheezing or shortness of breath. Patient not taking: Reported on 01/23/2022 07/28/21   Leath-Warren, Sadie Haber, NP  benzocaine (ORABASE-B) 20 % PSTE Use as directed 1 Application in the mouth or throat 4 (four) times daily as needed for mouth pain. Patient not taking: Reported on 01/23/2022 11/21/21   Raspet, Erin K, PA-C  brompheniramine-pseudoephedrine-DM 30-2-10 MG/5ML syrup Take 5 mLs by mouth 4 (four) times daily as needed. 06/06/21   Leath-Warren, Sadie Haber, NP  calcium carbonate (TUMS - DOSED IN MG ELEMENTAL CALCIUM) 500 MG chewable tablet Chew 1 tablet by mouth daily as needed for heartburn. As  needed    [provider]  cetirizine-pseudoephedrine (ZYRTEC-D) 5-120 MG tablet Take 1 tablet by mouth 2 (two) times daily. 06/06/21   Leath-Warren, Sadie Haber, NP  Clindamycin-Benzoyl Per, Refr, gel Apply 1 Application topically 2 (two) times daily. 12/14/21   Leath-Warren, Sadie Haber, NP  etonogestrel (NEXPLANON) 68 MG IMPL implant Inject 68 mg into the skin once.     [provider]  famotidine (PEPCID) 20 MG tablet Take 20 mg by mouth 2 (two) times daily.    [provider]  fluticasone (FLONASE) 50 MCG/ACT nasal spray Place 1 spray into both nostrils 2 (two) times daily. 01/10/22   Particia Nearing, PA-C  Metamucil Fiber CHEW Chew 2 each by mouth daily.    [provider]  pantoprazole (PROTONIX) 40 MG tablet 1 tablet Orally Once a day for 30 day(s) 01/03/22   [provider]  Probiotic Product (PROBIOTIC DAILY PO) Take 1 each by mouth daily. Patient not taking: Reported on 01/23/2022    [provider]    Family History Family History  Problem Relation Age of Onset   Breast cancer Mother 35   Autism Sister        1 sister    Social History Social History   Tobacco Use   Smoking status: Every Day    Packs/day: 0.50    Years: 5.00    Total pack years: 2.50    Types: Cigarettes   Smokeless tobacco: Never   Tobacco comments:    smokes 1-2 cig daily  Vaping Use   Vaping Use: Never used  Substance Use Topics   Alcohol use: Yes    Comment: rarely   Drug use: No     Allergies   Other, Flagyl [metronidazole], and Tinidazole   Review of Systems Review of Systems Per HPI  Physical Exam Triage Vital Signs ED Triage Vitals  Enc Vitals Group     BP 02/14/22 1534 130/88     Pulse Rate 02/14/22 1534 88     Resp 02/14/22 1534 18     Temp 02/14/22 1534 98.7 F (37.1 C)     Temp Source 02/14/22 1534 Oral     SpO2 02/14/22 1534 98 %     Weight --      Height --      Head Circumference --      Peak Flow --       Pain Score 02/14/22 1536 0     Pain Loc --      Pain Edu? --      Excl. in [place]? --    No data found.  Updated Vital Signs BP 130/88 (BP Location: Right Arm)   Pulse 88   Temp 98.7 F (37.1 C) (Oral)   Resp 18  SpO2 98%   Visual Acuity Right Eye Distance:   Left Eye Distance:   Bilateral Distance:    Right Eye Near:   Left Eye Near:    Bilateral Near:     Physical Exam Vitals and nursing note reviewed.  Constitutional:      General: She is not in acute distress.    Appearance: She is not toxic-appearing.  Pulmonary:     Effort: Pulmonary effort is normal. No respiratory distress.  Abdominal:     General: Abdomen is flat. Bowel sounds are normal. There is no distension.     Palpations: Abdomen is soft. There is no mass.     Tenderness: There is no abdominal tenderness. There is no right CVA tenderness, left CVA tenderness or guarding.  Skin:    General: Skin is warm and dry.     Coloration: Skin is not jaundiced or pale.     Findings: No erythema.  Neurological:     Mental Status: She is alert and oriented to person, place, and time.     Motor: No weakness.     Gait: Gait normal.  Psychiatric:        Behavior: Behavior is cooperative.      UC Treatments / Results  Labs (all labs ordered are listed, but only abnormal results are displayed) Labs Reviewed  POCT URINALYSIS DIP (MANUAL ENTRY) - Abnormal; Notable for the following components:      Result Value   Clarity, UA hazy (*)    Protein Ur, POC =30 (*)    Leukocytes, UA Trace (*)    All other components within normal limits  URINE CULTURE    EKG   Radiology No results found.  Procedures Procedures (including critical care time)  Medications Ordered in UC Medications - No data to display  Initial Impression / Assessment and Plan / UC Course  I have reviewed the triage vital signs and the nursing notes.  Pertinent labs & imaging results that were available during my care of the patient were  reviewed by me and considered in my medical decision making (see chart for details).   Patient is well-appearing, normotensive, afebrile, not tachycardic, not tachypneic, oxygenating well on room air.    1. Acute cystitis without hematuria Urinalysis today appears concentrated with trace leukocyte Estrace Will treat empirically for UTI with Macrobid twice daily for 5 days while urine culture is pending Recommended follow-up with PCP or urology with persistent or worsening symptoms despite treatment  The patient was given the opportunity to ask questions.  All questions answered to their satisfaction.  The patient is in agreement to this plan.    Final Clinical Impressions(s) / UC Diagnoses   Final diagnoses:  Acute cystitis without hematuria     Discharge Instructions      As we discussed, it sounds like you may have a urinary tract infection.  Please take the Macrobid as prescribed to treat it.  Will call you later this week if the urine culture shows we need to change antibiotic therapy or stop it.  Please make sure you are drinking more water to see if this helps with the urinary odor.    ED Prescriptions     Medication Sig Dispense Auth. Provider   nitrofurantoin, macrocrystal-monohydrate, (MACROBID) 100 MG capsule Take 1 capsule (100 mg total) by mouth 2 (two) times daily for 5 days. 10 capsule Eulogio Bear, NP      PDMP not reviewed this encounter.   Noemi Chapel  A, NP 02/14/22 1610

## 2022-02-14 NOTE — ED Triage Notes (Signed)
Frequent urination, states area where she pees is irritated.  Symptoms since Friday

## 2022-02-15 LAB — URINE CULTURE: Culture: <10000 — AB

## 2022-03-13 ENCOUNTER — Ambulatory Visit: Payer: Medicaid Other | Admitting: Adult Health

## 2022-03-13 ENCOUNTER — Encounter: Payer: Self-pay | Admitting: Adult Health

## 2022-03-13 VITALS — BP 116/78 | HR 88 | Ht 61.0 in | Wt 207.0 lb

## 2022-03-13 DIAGNOSIS — Z3009 Encounter for other general counseling and advice on contraception: Secondary | ICD-10-CM

## 2022-03-13 DIAGNOSIS — Z3202 Encounter for pregnancy test, result negative: Secondary | ICD-10-CM | POA: Insufficient documentation

## 2022-03-13 LAB — POCT URINE PREGNANCY: Preg Test, Ur: NEGATIVE

## 2022-03-13 NOTE — Progress Notes (Signed)
  Subjective:     Patient ID: Kimberly Norris, female   DOB: 1995/12/18, 27 y.o.   MRN: [id number]  HPI Kimberly Norris is a 27 year old white female,single, G0P0, in to discuss birth control options. She gained weight with nexplanon, but liked it. She says she will lose insurance the end of March.   Last pap was 01/03/22, with PCP  PCP is [place] Denies having sex currently +weight gain with last nexplanon Reviewed past medical,surgical, social and family history. Reviewed medications and allergies.     Objective:   Physical Exam BP 116/78 (BP Location: Left Arm, Patient Position: Sitting, Cuff Size: Large)   Pulse 88   Ht 5' 1"$  (1.549 m)   Wt 207 lb (93.9 kg)   LMP 03/06/2022 (Exact Date)   BMI 39.11 kg/m  UPT is negative.  Skin warm and dry. Lungs: clear to ausculation bilaterally. Cardiovascular: regular rate and rhythm.    Fall risk is low  Upstream - 03/13/22 1027       Pregnancy Intention Screening   Does the patient want to become pregnant in the next year? No    Does the patient's partner want to become pregnant in the next year? No    Would the patient like to discuss contraceptive options today? Yes      Contraception Wrap Up   Current Method Abstinence    End Method Abstinence   wants nexplanon not today.   Contraception Counseling Provided Yes             Assessment:     1. Pregnancy examination or test, negative result  2. General counseling and advice for contraceptive management She is not having sex, but wants to be covered, discussed options and she wants nexplanon, but not today, will come back No sex til after nexplanon insertion.     Plan:    NO sex til after insertion Return 03/23/22 for nexplanon insertion

## 2022-03-16 ENCOUNTER — Other Ambulatory Visit: Payer: Self-pay

## 2022-03-16 ENCOUNTER — Ambulatory Visit
Admission: RE | Admit: 2022-03-16 | Discharge: 2022-03-16 | Disposition: A | Discharge status: Home / Self Care | Payer: Medicaid Other | Source: Ambulatory Visit | Attending: Family Medicine | Admitting: Family Medicine

## 2022-03-16 VITALS — BP 107/69 | HR 118 | Temp 102.7°F | Resp 20

## 2022-03-16 DIAGNOSIS — F17210 Nicotine dependence, cigarettes, uncomplicated: Secondary | ICD-10-CM | POA: Insufficient documentation

## 2022-03-16 DIAGNOSIS — R509 Fever, unspecified: Secondary | ICD-10-CM | POA: Insufficient documentation

## 2022-03-16 DIAGNOSIS — Z1152 Encounter for screening for COVID-19: Secondary | ICD-10-CM | POA: Insufficient documentation

## 2022-03-16 DIAGNOSIS — J069 Acute upper respiratory infection, unspecified: Secondary | ICD-10-CM | POA: Insufficient documentation

## 2022-03-16 LAB — POCT INFLUENZA A/B
Influenza A, POC: NEGATIVE
Influenza B, POC: NEGATIVE

## 2022-03-16 MED ORDER — ACETAMINOPHEN 500 MG PO TABS
500.0000 mg | ORAL_TABLET | Freq: Once | ORAL | Status: AC
  Administered 2022-03-16: 500 mg via ORAL

## 2022-03-16 MED ORDER — ONDANSETRON 4 MG PO TBDP: 4.0000 mg | ORAL_TABLET | Freq: Three times a day (TID) | ORAL | 0 refills | Status: DC | PRN

## 2022-03-16 MED ORDER — ONDANSETRON 4 MG PO TBDP
4.0000 mg | ORAL_TABLET | Freq: Once | ORAL | Status: AC
  Administered 2022-03-16: 4 mg via ORAL

## 2022-03-16 NOTE — ED Triage Notes (Signed)
Pt reports cough, fever, body aches since Tuesday night. Last dose of tylenol and ibuprofen last night.

## 2022-03-16 NOTE — ED Provider Notes (Signed)
[facility]    CSN: [id number] Arrival date & time: 03/16/22  1422      History   Chief Complaint Chief Complaint  Patient presents with   Cough    I have body aches, I feel weak, headaches, chills, fevers come and go and I'm coughing up phlegm so my chest hurts . - Entered by patient    HPI Kimberly Norris is a 27 y.o. female.   Patient presenting today with 2-day history of fever, chills, body aches, sweats, cough, congestion.  Denies chest pain, shortness of breath, sore throat, abdominal pain, nausea vomiting or diarrhea.  Took a dose of Tylenol and Mucinex yesterday with no relief.  No known sick contacts recently.  No known pertinent chronic medical problems.     Past Medical History:  Diagnosis Date   Bilateral chronic knee pain 04/16/2012   GERD (gastroesophageal reflux disease)    Heartburn    Liver disease    fatty liver   Mental disorder    anxiety, depression   Rhabdomyolysis     Patient Active Problem List   Diagnosis Date Noted   General counseling and advice for contraceptive management 03/13/2022   Pregnancy examination or test, negative result 03/13/2022   Rectal bleeding 12/02/2020   Grade III hemorrhoids 11/16/2020   Family history of breast cancer in mother 11/03/2020   Nipple pain 11/03/2020   Nexplanon in place 11/03/2020   Adjustment disorder with emotional disturbance    Self-injurious behavior    Dysphagia 04/29/2020   Abdominal wall bulge 04/29/2020   Alternating constipation and diarrhea 03/17/2020   RUQ abdominal pain 03/17/2020   Anal fissure 02/17/2020   Skin tag of perianal region 02/17/2020   Cold sore 12/18/2019   Gastroesophageal reflux disease 08/18/2019   Fatty liver 08/18/2019   Traumatic rhabdomyolysis ([place]) 07/12/2015   Rhabdomyolysis 07/12/2015   Acquired genu valgum, 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  All pathology was benign.    HEMORRHOID SURGERY N/A 12/01/2020   Procedure: HEMORRHOIDECTOMY; SIMPLE;  Surgeon: Virl Cagey, MD;  Location: [facility];  Service: General;  Laterality: N/A;   NO PAST SURGERIES      OB History     Gravida  0   Para  0   Term  0   Preterm  0   AB  0   Living  0      SAB  0   IAB  0   Ectopic  0   Multiple  0   Live Births  0            Home Medications    Prior to Admission medications   Medication Sig Start Date End Date Taking? Authorizing Provider  ondansetron (ZOFRAN-ODT) 4 MG disintegrating tablet Take 1 tablet (4 mg total) by mouth every 8 (eight) hours as needed for nausea or vomiting. 03/16/22  Yes Volney American, PA-C  calcium carbonate (TUMS - DOSED IN MG ELEMENTAL CALCIUM) 500 MG chewable tablet Chew 1 tablet by mouth daily as needed for heartburn. As  needed    [provider]  Clindamycin-Benzoyl Per, Refr, gel Apply 1 Application topically 2 (two) times daily. 12/14/21   Leath-Warren, Alda Lea, NP  ergocalciferol (VITAMIN D2) 1.25 MG (50000 UT) capsule 1 capsule Orally ONCE WEEKLY 01/10/22   [provider]  fluticasone (FLONASE) 50 MCG/ACT nasal spray Place 1 spray into both nostrils 2 (two) times daily. Patient taking differently: Place 1 spray into both nostrils as needed. 01/10/22   Volney American, PA-C  Metamucil Fiber CHEW Chew 2 each by mouth daily.    [provider]  pantoprazole (PROTONIX) 40 MG tablet 1 tablet Orally Once a day for 30 day(s) 01/03/22   [provider]   Probiotic Product (PROBIOTIC DAILY PO) Take 1 each by mouth daily.    [provider]    Family History Family History  Problem Relation Age of Onset   Breast cancer Mother 15   Autism Sister        1 sister    Social History Social History   Tobacco Use   Smoking status: Every Day    Packs/day: 0.25    Years: 5.00    Total pack years: 1.25    Types: Cigarettes   Smokeless tobacco: Never   Tobacco comments:    smokes 1-2 cig daily  Vaping Use   Vaping Use: Never used  Substance Use Topics   Alcohol use: Not Currently   Drug use: No     Allergies   Other, Flagyl [metronidazole], and Tinidazole   Review of Systems Review of Systems PER HPI  Physical Exam Triage Vital Signs ED Triage Vitals  Enc Vitals Group     BP 03/16/22 1448 107/69     Pulse Rate 03/16/22 1448 (!) 118     Resp 03/16/22 1448 20     Temp 03/16/22 1448 (!) 102.7 F (39.3 C)     Temp Source 03/16/22 1448 Oral     SpO2 03/16/22 1448 98 %     Weight --      Height --      Head Circumference --      Peak Flow --      Pain Score 03/16/22 1446 9     Pain Loc --      Pain Edu? --      Excl. in [place]? --    No data found.  Updated Vital Signs BP 107/69 (BP Location: Right Arm)   Pulse (!) 118   Temp (!) 102.7 F (39.3 C) (Oral)   Resp 20   LMP 03/06/2022 (Exact Date)   SpO2 98%   Visual Acuity Right Eye Distance:   Left Eye Distance:   Bilateral Distance:    Right Eye Near:   Left Eye Near:    Bilateral Near:     Physical Exam Vitals and nursing note reviewed.  Constitutional:      Appearance: Normal appearance.  HENT:     Head: Atraumatic.     Right Ear: Tympanic membrane and external ear normal.     Left Ear: Tympanic membrane and external ear normal.     Nose: Rhinorrhea present.     Mouth/Throat:     Mouth: Mucous membranes are moist.     Pharynx: No oropharyngeal exudate or posterior oropharyngeal erythema.  Eyes:     Extraocular Movements: Extraocular  movements intact.     Conjunctiva/sclera: Conjunctivae normal.  Cardiovascular:     Rate and Rhythm: Normal rate and regular rhythm.  Heart sounds: Normal heart sounds.  Pulmonary:     Effort: Pulmonary effort is normal.     Breath sounds: Normal breath sounds. No wheezing or rales.  Musculoskeletal:        General: Normal range of motion.     Cervical back: Normal range of motion and neck supple.  Skin:    General: Skin is warm and dry.  Neurological:     Mental Status: She is alert and oriented to person, place, and time.  Psychiatric:        Mood and Affect: Mood normal.        Thought Content: Thought content normal.    UC Treatments / Results  Labs (all labs ordered are listed, but only abnormal results are displayed) Labs Reviewed  SARS CORONAVIRUS 2 (TAT 6-24 HRS)  POCT INFLUENZA A/B   EKG  Radiology No results found.  Procedures Procedures (including critical care time)  Medications Ordered in UC Medications  ondansetron (ZOFRAN-ODT) disintegrating tablet 4 mg (has no administration in time range)  acetaminophen (TYLENOL) tablet 500 mg (500 mg Oral Given 03/16/22 1452)    Initial Impression / Assessment and Plan / UC Course  I have reviewed the triage vital signs and the nursing notes.  Pertinent labs & imaging results that were available during my care of the patient were reviewed by me and considered in my medical decision making (see chart for details).     Febrile in triage, Tylenol given at this time.  Zofran given at discharge for active nausea, COVID test pending.  Rapid flu is negative.  Do suspect COVID or COVID-like illness causing her symptoms.  Treat with Zofran, DayQuil, NyQuil, ibuprofen and Tylenol.  Work note given.  Return for worsening symptoms.  Final Clinical Impressions(s) / UC Diagnoses   Final diagnoses:  Viral URI  Fever, unspecified   Discharge Instructions   None    ED Prescriptions     Medication Sig Dispense Auth.  Provider   ondansetron (ZOFRAN-ODT) 4 MG disintegrating tablet Take 1 tablet (4 mg total) by mouth every 8 (eight) hours as needed for nausea or vomiting. 20 tablet Volney American, [place]      PDMP not reviewed this encounter.   Volney American, [place] 03/16/22 1559

## 2022-03-17 LAB — SARS CORONAVIRUS 2 (TAT 6-24 HRS): SARS Coronavirus 2: NEGATIVE

## 2022-03-19 ENCOUNTER — Encounter (HOSPITAL_COMMUNITY): Payer: Self-pay

## 2022-03-19 ENCOUNTER — Emergency Department (HOSPITAL_COMMUNITY)
Admission: EM | Admit: 2022-03-19 | Discharge: 2022-03-19 | Disposition: A | Discharge status: Home / Self Care | Payer: Medicaid Other | Attending: Student | Admitting: Student

## 2022-03-19 ENCOUNTER — Other Ambulatory Visit: Payer: Self-pay

## 2022-03-19 DIAGNOSIS — Z20822 Contact with and (suspected) exposure to covid-19: Secondary | ICD-10-CM | POA: Insufficient documentation

## 2022-03-19 DIAGNOSIS — R509 Fever, unspecified: Secondary | ICD-10-CM | POA: Diagnosis present

## 2022-03-19 DIAGNOSIS — J101 Influenza due to other identified influenza virus with other respiratory manifestations: Secondary | ICD-10-CM | POA: Diagnosis not present

## 2022-03-19 LAB — GROUP A STREP BY PCR: Group A Strep by PCR: NOT DETECTED

## 2022-03-19 LAB — RESP PANEL BY RT-PCR (RSV, FLU A&B, COVID)  RVPGX2
Influenza A by PCR: NEGATIVE
Influenza B by PCR: POSITIVE — AB
Resp Syncytial Virus by PCR: NEGATIVE
SARS Coronavirus 2 by RT PCR: NEGATIVE

## 2022-03-19 MED ORDER — BENZONATATE 100 MG PO CAPS: 100.0000 mg | ORAL_CAPSULE | Freq: Three times a day (TID) | ORAL | 0 refills | Status: DC

## 2022-03-19 NOTE — ED Provider Notes (Signed)
[facility] Provider Note   CSN: [id number] Arrival date & time: 03/19/22  [id number]     History  Chief Complaint  Patient presents with   Cough    Kimberly Norris is a 27 y.o. female.  Patient presents with chief complaint of fever, cough, sore throat which began on Tuesday.  She states she was evaluated in urgent care on Thursday and had negative COVID and flu test.  She states that she continues to have a cough.  She denies fever for the past 2 days.  She denies shortness of breath, chest pain, abdominal pain, nausea, vomiting.  Past medical history significant for GERD  HPI     Home Medications Prior to Admission medications   Medication Sig Start Date End Date Taking? Authorizing Provider  benzonatate (TESSALON) 100 MG capsule Take 1 capsule (100 mg total) by mouth every 8 (eight) hours. 03/19/22  Yes Dorothyann Peng, PA-C  calcium carbonate (TUMS - DOSED IN MG ELEMENTAL CALCIUM) 500 MG chewable tablet Chew 1 tablet by mouth daily as needed for heartburn. As needed    [provider]  Clindamycin-Benzoyl Per, Refr, gel Apply 1 Application topically 2 (two) times daily. 12/14/21   Leath-Warren, Alda Lea, NP  ergocalciferol (VITAMIN D2) 1.25 MG (50000 UT) capsule 1 capsule Orally ONCE WEEKLY 01/10/22   [provider]  fluticasone (FLONASE) 50 MCG/ACT nasal spray Place 1 spray into both nostrils 2 (two) times daily. Patient taking differently: Place 1 spray into both nostrils as needed. 01/10/22   Volney American, PA-C  Metamucil Fiber CHEW Chew 2 each by mouth daily.    [provider]  ondansetron (ZOFRAN-ODT) 4 MG disintegrating tablet Take 1 tablet (4 mg total) by mouth every 8 (eight) hours as needed for nausea or vomiting. 03/16/22   Volney American, PA-C  pantoprazole (PROTONIX) 40 MG tablet 1 tablet Orally Once a day for 30 day(s) 01/03/22   [provider]  Probiotic Product  (PROBIOTIC DAILY PO) Take 1 each by mouth daily.    [provider]      Allergies    Other, Flagyl [metronidazole], and Tinidazole    Review of Systems   Review of Systems  Constitutional:  Positive for chills and fever (Resolved).  HENT:  Positive for rhinorrhea.   Respiratory:  Positive for cough.     Physical Exam Updated Vital Signs BP 124/77 (BP Location: Right Arm)   Pulse 74   Temp 98.8 F (37.1 C) (Oral)   Resp (!) 22   Ht '5\' 1"'$  (1.549 m)   Wt 93.4 kg   LMP 03/06/2022 (Exact Date)   SpO2 100%   BMI 38.92 kg/m  Physical Exam Vitals and nursing note reviewed.  Constitutional:      General: She is not in acute distress.    Appearance: She is well-developed.  HENT:     Head: Normocephalic and atraumatic.  Eyes:     Conjunctiva/sclera: Conjunctivae normal.  Cardiovascular:     Rate and Rhythm: Normal rate and regular rhythm.     Heart sounds: No murmur heard. Pulmonary:     Effort: Pulmonary effort is normal. No respiratory distress.     Breath sounds: Normal breath sounds.  Abdominal:     Palpations: Abdomen is soft.     Tenderness: There is no abdominal tenderness.  Musculoskeletal:        General: No swelling.     Cervical back: Neck supple.  Skin:    General: Skin is warm and dry.     Capillary Refill: Capillary refill takes less than 2 seconds.  Neurological:     Mental Status: She is alert.  Psychiatric:        Mood and Affect: Mood normal.     ED Results / Procedures / Treatments   Labs (all labs ordered are listed, but only abnormal results are displayed) Labs Reviewed  RESP PANEL BY RT-PCR (RSV, FLU A&B, COVID)  [id number] - Abnormal; Notable for the following components:      Result Value   Influenza B by PCR POSITIVE (*)    All other components within normal limits  GROUP A STREP BY PCR    EKG None  Radiology No results found.  Procedures Procedures    Medications Ordered in ED Medications - No data to display  ED  Course/ Medical Decision Making/ A&P                             Medical Decision Making  Patient presents with a chief complaint of upper respiratory symptoms.  Differential diagnosis includes but is not limited to COVID-19, influenza, RSV, strep throat, others  I reviewed the patient's past medical history including notes from urgent care from February 29.  Patient diagnosed with a viral upper respiratory infection with negative COVID and influenza testing at that time  I ordered and reviewed lab results which included positive influenza B test result.  Patient was negative for group A strep, COVID, influenza A, RSV  Patient is afebrile at this time.  Plan to discharge patient home.  Influenza B test result matches patient's physical and history.  Plan to discharge with prescription for Tessalon Perles for cough suppression.  Patient will continue with over-the-counter medications for pain and fever management as needed.  Return precautions provided.        Final Clinical Impression(s) / ED Diagnoses Final diagnoses:  Influenza B    Rx / DC Orders ED Discharge Orders          Ordered    benzonatate (TESSALON) 100 MG capsule  Every 8 hours        03/19/22 2045              Ronny Bacon 03/19/22 2046    Teressa Lower, MD 03/20/22 1213

## 2022-03-19 NOTE — Discharge Instructions (Addendum)
You were diagnosed today with influenza B.  Please continue to take over-the-counter medication for fever and pain control as needed.  You may take the prescribed cough medication as directed.  If you develop shortness of breath or other life threatening symptoms please return to the emergency department.

## 2022-03-19 NOTE — ED Triage Notes (Signed)
Pt reports she was seen [facility] on Thursday for fever, cough and sore throat and had negative swabs.  Pt reports her cough is getting worse.

## 2022-03-23 ENCOUNTER — Encounter: Payer: Medicaid Other | Admitting: Adult Health

## 2022-04-04 ENCOUNTER — Ambulatory Visit
Admission: EM | Admit: 2022-04-04 | Discharge: 2022-04-04 | Disposition: A | Discharge status: Home / Self Care | Payer: Medicaid Other | Attending: Nurse Practitioner | Admitting: Nurse Practitioner

## 2022-04-04 DIAGNOSIS — N760 Acute vaginitis: Secondary | ICD-10-CM

## 2022-04-04 LAB — POCT URINALYSIS DIP (MANUAL ENTRY)
Bilirubin, UA: NEGATIVE
Blood, UA: NEGATIVE
Glucose, UA: NEGATIVE mg/dL
Ketones, POC UA: NEGATIVE mg/dL
Leukocytes, UA: NEGATIVE
Nitrite, UA: NEGATIVE
Protein Ur, POC: 30 mg/dL — AB
Spec Grav, UA: 1.025 (ref 1.010–1.025)
Urobilinogen, UA: 1 E.U./dL
pH, UA: 7 (ref 5.0–8.0)

## 2022-04-04 MED ORDER — FLUCONAZOLE 150 MG PO TABS: 150.0000 mg | ORAL_TABLET | Freq: Every day | ORAL | 0 refills | Status: DC

## 2022-04-04 NOTE — Discharge Instructions (Signed)
As we discussed, it sounds like you have a yeast infection Please take the Diflucan tablet to treat it The urine sample today does not show signs of infection; please increase water intake We will call you if the test results show we need to treat with anything else besides the Diflucan Use condoms with every sexual encounter

## 2022-04-04 NOTE — ED Provider Notes (Signed)
[facility]    CSN: [id number] Arrival date & time: 04/04/22  1626      History   Chief Complaint Chief Complaint  Patient presents with   Vaginitis    HPI Kimberly Norris is a 27 y.o. female.   Patient presents today for 1 day of vaginal discharge a few days of urinary frequency and voiding smaller amounts.  Reports she is getting over the flu and has not been staying very well hydrated.  Reports the vaginal discharge is white and like "cheese."  She has a little bit of vaginal irritation but no significant itching.  No vaginal sores, lesions, or rashes.  No abdominal pain, new nausea/vomiting, or suprapubic pain/pressure.  No back/flank pain.  No recently sexual activity and patient is not concern about STIs today.  Reports has a history of yeast infections and this feels similar.  Also feels like she may be starting her menses.     Past Medical History:  Diagnosis Date   Bilateral chronic knee pain 04/16/2012   GERD (gastroesophageal reflux disease)    Heartburn    Liver disease    fatty liver   Mental disorder    anxiety, depression   Rhabdomyolysis     Patient Active Problem List   Diagnosis Date Noted   General counseling and advice for contraceptive management 03/13/2022   Pregnancy examination or test, negative result 03/13/2022   Rectal bleeding 12/02/2020   Grade III hemorrhoids 11/16/2020   Family history of breast cancer in mother 11/03/2020   Nipple pain 11/03/2020   Nexplanon in place 11/03/2020   Adjustment disorder with emotional disturbance    Self-injurious behavior    Dysphagia 04/29/2020   Abdominal wall bulge 04/29/2020   Alternating constipation and diarrhea 03/17/2020   RUQ abdominal pain 03/17/2020   Anal fissure 02/17/2020   Skin tag of perianal region 02/17/2020   Cold sore 12/18/2019   Gastroesophageal reflux disease 08/18/2019   Fatty liver 08/18/2019   Traumatic rhabdomyolysis ([place]) 07/12/2015   Rhabdomyolysis  07/12/2015   Acquired genu valgum, 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  All pathology was benign.    HEMORRHOID SURGERY N/A 12/01/2020   Procedure: HEMORRHOIDECTOMY; SIMPLE;  Surgeon: Virl Cagey, MD;  Location: [facility];  Service: General;  Laterality: N/A;   NO PAST SURGERIES      OB History     Gravida  0   Para  0   Term  0   Preterm  0   AB  0   Living  0      SAB  0   IAB  0   Ectopic  0   Multiple  0   Live Births  0            Home Medications    Prior to Admission medications   Medication Sig Start Date End Date Taking? Authorizing Provider  fluconazole (DIFLUCAN) 150 MG tablet Take 1 tablet (150 mg total) by mouth daily. 04/04/22  Yes Eulogio Bear, NP  calcium carbonate (TUMS - DOSED  IN MG ELEMENTAL CALCIUM) 500 MG chewable tablet Chew 1 tablet by mouth daily as needed for heartburn. As needed    [provider]  Clindamycin-Benzoyl Per, Refr, gel Apply 1 Application topically 2 (two) times daily. 12/14/21   Leath-Warren, Alda Lea, NP  ergocalciferol (VITAMIN D2) 1.25 MG (50000 UT) capsule 1 capsule Orally ONCE WEEKLY 01/10/22   [provider]  fluticasone (FLONASE) 50 MCG/ACT nasal spray Place 1 spray into both nostrils 2 (two) times daily. Patient taking differently: Place 1 spray into both nostrils as needed. 01/10/22   Volney American, PA-C  Metamucil Fiber CHEW Chew 2 each by mouth daily.    [provider]   ondansetron (ZOFRAN-ODT) 4 MG disintegrating tablet Take 1 tablet (4 mg total) by mouth every 8 (eight) hours as needed for nausea or vomiting. 03/16/22   Volney American, PA-C  pantoprazole (PROTONIX) 40 MG tablet 1 tablet Orally Once a day for 30 day(s) 01/03/22   [provider]  Probiotic Product (PROBIOTIC DAILY PO) Take 1 each by mouth daily.    [provider]    Family History Family History  Problem Relation Age of Onset   Breast cancer Mother 11   Autism Sister        1 sister    Social History Social History   Tobacco Use   Smoking status: Every Day    Packs/day: 0.25    Years: 5.00    Additional pack years: 0.00    Total pack years: 1.25    Types: Cigarettes   Smokeless tobacco: Never   Tobacco comments:    smokes 1-2 cig daily  Vaping Use   Vaping Use: Never used  Substance Use Topics   Alcohol use: Not Currently   Drug use: No     Allergies   Other, Flagyl [metronidazole], and Tinidazole   Review of Systems Review of Systems Per HPI  Physical Exam Triage Vital Signs ED Triage Vitals  Enc Vitals Group     BP 04/04/22 1631 125/82     Pulse Rate 04/04/22 1631 99     Resp 04/04/22 1631 18     Temp 04/04/22 1631 97.9 F (36.6 C)     Temp Source 04/04/22 1631 Oral     SpO2 04/04/22 1631 97 %     Weight --      Height --      Head Circumference --      Peak Flow --      Pain Score 04/04/22 1633 0     Pain Loc --      Pain Edu? --      Excl. in [place]? --    No data found.  Updated Vital Signs BP 125/82 (BP Location: Right Arm)   Pulse 99   Temp 97.9 F (36.6 C) (Oral)   Resp 18   LMP 03/06/2022 (Exact Date)   SpO2 97%   Visual Acuity Right Eye Distance:   Left Eye Distance:   Bilateral Distance:    Right Eye Near:   Left Eye Near:    Bilateral Near:     Physical Exam Vitals and nursing note reviewed.  Constitutional:      General: She is not in acute distress.    Appearance: Normal appearance. She is  not toxic-appearing.  Pulmonary:     Effort: Pulmonary effort is normal. No respiratory distress.  Genitourinary:    Comments: GU exam deferred - self swab performed Skin:  General: Skin is warm and dry.     Coloration: Skin is not jaundiced or pale.     Findings: No erythema.  Neurological:     Mental Status: She is alert and oriented to person, place, and time.     Motor: No weakness.     Gait: Gait normal.  Psychiatric:        Behavior: Behavior is cooperative.      UC Treatments / Results  Labs (all labs ordered are listed, but only abnormal results are displayed) Labs Reviewed  POCT URINALYSIS DIP (MANUAL ENTRY) - Abnormal; Notable for the following components:      Result Value   Protein Ur, POC =30 (*)    All other components within normal limits  CERVICOVAGINAL ANCILLARY ONLY    EKG   Radiology No results found.  Procedures Procedures (including critical care time)  Medications Ordered in UC Medications - No data to display  Initial Impression / Assessment and Plan / UC Course  I have reviewed the triage vital signs and the nursing notes.  Pertinent labs & imaging results that were available during my care of the patient were reviewed by me and considered in my medical decision making (see chart for details).   Patient is well-appearing, normotensive, afebrile, not tachycardic, not tachypneic, oxygenating well on room air.    1. Acute vaginitis Given symptoms, suspect yeast vaginitis Will treat with 1 tablet of Diflucan Cytology is pending Urinalysis today is negative for signs of infection; recommended increasing water intake and urine culture deferred at this time ER and return precautions discussed with patient  The patient was given the opportunity to ask questions.  All questions answered to their satisfaction.  The patient is in agreement to this plan.    Final Clinical Impressions(s) / UC Diagnoses   Final diagnoses:  Acute vaginitis      Discharge Instructions      As we discussed, it sounds like you have a yeast infection Please take the Diflucan tablet to treat it The urine sample today does not show signs of infection; please increase water intake We will call you if the test results show we need to treat with anything else besides the Diflucan Use condoms with every sexual encounter    ED Prescriptions     Medication Sig Dispense Auth. Provider   fluconazole (DIFLUCAN) 150 MG tablet Take 1 tablet (150 mg total) by mouth daily. 1 tablet Eulogio Bear, NP      PDMP not reviewed this encounter.   Eulogio Bear, NP 04/04/22 1705

## 2022-04-05 LAB — CERVICOVAGINAL ANCILLARY ONLY
Bacterial Vaginitis (gardnerella): NEGATIVE
Candida Glabrata: POSITIVE — AB
Candida Vaginitis: NEGATIVE
Comment: NEGATIVE
Comment: NEGATIVE
Comment: NEGATIVE

## 2022-04-06 ENCOUNTER — Telehealth (HOSPITAL_COMMUNITY): Payer: Self-pay | Admitting: Emergency Medicine

## 2022-04-06 ENCOUNTER — Ambulatory Visit: Payer: Medicaid Other | Admitting: Advanced Practice Midwife

## 2022-04-06 ENCOUNTER — Encounter: Payer: Self-pay | Admitting: Advanced Practice Midwife

## 2022-04-06 VITALS — BP 125/81 | HR 95 | Ht 61.0 in | Wt 202.3 lb

## 2022-04-06 DIAGNOSIS — Z30017 Encounter for initial prescription of implantable subdermal contraceptive: Secondary | ICD-10-CM | POA: Diagnosis not present

## 2022-04-06 DIAGNOSIS — Z3202 Encounter for pregnancy test, result negative: Secondary | ICD-10-CM

## 2022-04-06 DIAGNOSIS — Z975 Presence of (intrauterine) contraceptive device: Secondary | ICD-10-CM

## 2022-04-06 LAB — POCT URINE PREGNANCY: Preg Test, Ur: NEGATIVE

## 2022-04-06 MED ORDER — ETONOGESTREL 68 MG ~~LOC~~ IMPL
68.0000 mg | DRUG_IMPLANT | Freq: Once | SUBCUTANEOUS | Status: AC
  Administered 2022-04-06 (×2): 68 mg via SUBCUTANEOUS

## 2022-04-06 MED ORDER — FLUCONAZOLE 150 MG PO TABS: 150.0000 mg | ORAL_TABLET | Freq: Once | ORAL | 0 refills | Status: AC

## 2022-04-06 NOTE — Progress Notes (Signed)
  HPI:  Kimberly Norris is a 27 y.o. year old Caucasian female here for Nexplanon insertion.  Her LMP was 2/19 , and her pregnancy test today was negative.  She had a Nexplanon removed and replaced (in same spot) in January 2024, but had it removed within a week because her arm was so sore. Wants it placed in same spot. Risks/benefits/side effects of Nexplanon have been discussed and her questions have been answered.  Specifically, a failure rate of 01/998 has been reported, with an increased failure rate if pt takes [place] and/or antiseizure medicaitons.  MARGARETHE SCHOENBERGER is aware of the common side effect of irregular bleeding, which the incidence of decreases over time.   Past Medical History: Past Medical History:  Diagnosis Date   Bilateral chronic knee pain 04/16/2012   GERD (gastroesophageal reflux disease)    Heartburn    Liver disease    fatty liver   Mental disorder    anxiety, depression   Rhabdomyolysis     Past Surgical History: Past Surgical History:  Procedure Laterality Date   BIOPSY  09/11/2019   Procedure: BIOPSY;  Surgeon: Eloise Harman, DO;  [facility];  Service: Endoscopy;;   ESOPHAGOGASTRODUODENOSCOPY (EGD) WITH PROPOFOL N/A 09/11/2019   Procedure: ESOPHAGOGASTRODUODENOSCOPY (EGD) WITH PROPOFOL;  Surgeon: Eloise Harman, DO;  Esophageal mucosal changes suspicious for eosinophilic esophagitis s/p biopsy, gastritis s/p biopsy, normal examined duodenum biopsy.  All pathology was benign.    HEMORRHOID SURGERY N/A 12/01/2020   Procedure: HEMORRHOIDECTOMY; SIMPLE;  Surgeon: Virl Cagey, MD;  Location: [facility];  Service: General;  Laterality: N/A;   NO PAST SURGERIES      Family History: Family History  Problem Relation Age of Onset   Breast cancer Mother 36   Autism Sister        1 sister    Social History: Social History   Tobacco Use   Smoking status: Every Day    Packs/day: 0.25    Years: 5.00    Additional pack years:  0.00    Total pack years: 1.25    Types: Cigarettes   Smokeless tobacco: Never   Tobacco comments:    smokes 1-2 cig daily  Vaping Use   Vaping Use: Never used  Substance Use Topics   Alcohol use: Not Currently   Drug use: No    Allergies:  Allergies  Allergen Reactions   Other Anaphylaxis    Lilly flowers   Flagyl [Metronidazole] Rash   Tinidazole Rash      Her left arm, approximatly 4 inches proximal from the elbow, was cleansed with alcohol and anesthetized with 2cc of 2% Lidocaine.  The area was cleansed again and the Nexplanon was inserted without difficulty.  A pressure bandage was applied.  Pt was instructed to remove pressure bandage in a few hours, and keep insertion site covered with a bandaid for 3 days.  Back up contraception was recommended for 2 weeks.  Follow-up scheduled PRN problems  Christin Fudge 04/06/2022 2:18 PM

## 2022-04-06 NOTE — Patient Instructions (Signed)
  Nexplanon   Contraceptive Implant Information A contraceptive implant is a plastic rod that is inserted under your skin. It is usually inserted under the skin of your upper arm. It continually releases small amounts of progestin (synthetic progesterone) into your bloodstream. This prevents an egg from being released from your ovaries. It also thickens your cervical mucus to prevent sperm from entering the cervix, and it thins your uterine lining to prevent a fertilized egg from attaching to your uterus. Contraceptive implants can be effective for up to 3 years. They do not provide protection against sexually transmitted diseases (STDs).  The procedure to insert an implant usually takes about 10 minutes. There may be minor bruising, swelling, and discomfort at the insertion site for a couple days. The implant begins to work within the first day. Other contraceptive protection may be necessary for 7 days. Be sure to discuss with your health care provider if you need a backup method of contraception.  Your health care provider will make sure you are a good candidate for the contraceptive implant. Discuss with your health care provider the possible side effects of the implant. ADVANTAGES It prevents pregnancy for up to 3 years. It is easily reversible. It is convenient. It can be used when breastfeeding. It can be used by women who cannot take estrogen. DISADVANTAGES You may have irregular or unplanned vaginal bleeding. You may develop side effects, including headache, weight gain, acne, breast tenderness, or mood changes. You may have tissue or nerve damage after insertion (rare). It may be difficult and uncomfortable to remove. Certain medicines may interfere with the effectiveness of the implant.  REMOVAL OF IMPLANT The implant should be removed in 3 years or as directed by your health care provider. The implant's effect wears off in a few hours after removal. Your ability to get pregnant  (fertility) may be restored in 1-2 weeks. A new implant can be inserted as soon as the old one is removed if desired. CONTRAINDICATIONS You should not get the implant if you are experiencing any of the following situations: You are pregnant. You have a history of breast cancer, osteoporosis, blood clots, heart disease, diabetes, high blood pressure, liver disease, tumors, or stroke.   You have undiagnosed vaginal bleeding. You have a sensitivity to any part of the implant. This information is not intended to replace advice given to you by your health care provider. Make sure you discuss any questions you have with your health care provider. Document Released: 12/22/2010 Document Revised: 09/04/2012 Document Reviewed: 07/01/2012 Elsevier Interactive Patient Education  2017 Elsevier Inc.    

## 2022-04-06 NOTE — Addendum Note (Signed)
Addended by: Diona Fanti A on: 04/06/2022 02:48 PM   Modules accepted: Orders

## 2022-04-09 ENCOUNTER — Encounter: Payer: Self-pay | Admitting: Advanced Practice Midwife

## 2022-04-11 ENCOUNTER — Encounter: Payer: Self-pay | Admitting: Adult Health

## 2022-04-11 ENCOUNTER — Ambulatory Visit: Payer: Medicaid Other | Admitting: Adult Health

## 2022-04-11 VITALS — BP 124/77 | HR 70 | Ht 61.0 in | Wt 204.0 lb

## 2022-04-11 DIAGNOSIS — M79622 Pain in left upper arm: Secondary | ICD-10-CM | POA: Diagnosis not present

## 2022-04-11 DIAGNOSIS — Z975 Presence of (intrauterine) contraceptive device: Secondary | ICD-10-CM | POA: Diagnosis not present

## 2022-04-11 MED ORDER — FLUCONAZOLE 150 MG PO TABS: ORAL_TABLET | ORAL | 1 refills | Status: DC

## 2022-04-11 MED ORDER — MEGESTROL ACETATE 40 MG PO TABS: 40.0000 mg | ORAL_TABLET | Freq: Two times a day (BID) | ORAL | 1 refills | Status: DC

## 2022-04-11 MED ORDER — CEPHALEXIN 500 MG PO CAPS: 500.0000 mg | ORAL_CAPSULE | Freq: Three times a day (TID) | ORAL | 0 refills | Status: DC

## 2022-04-11 NOTE — Progress Notes (Signed)
  Subjective:     Patient ID: Penny Pia, female   DOB: 1995-01-28, 27 y.o.   MRN: [id number]  HPI Nyarie is a 27 year old white female,single, G0P0, in complaining of pain left arm where nexplanon placed last week, feels stabbing.  Last pap was negative 01/03/22 in [place]  PCP is [facility]  Review of Systems Pain that is stabbing where nexplanon placed last week  Reviewed past medical,surgical, social and family history. Reviewed medications and allergies.     Objective:   Physical Exam     Assessment:     1. Pain in left upper arm Feels like stabbing pains where nexplanon was placed Is tender to touch and has fading bruise Will rx keflex in case in infection, and also rx diflucan  Meds ordered this encounter  Medications   cephALEXin (KEFLEX) 500 MG capsule    Sig: Take 1 capsule (500 mg total) by mouth 3 (three) times daily.    Dispense:  21 capsule    Refill:  0    Order Specific Question:   Supervising Provider    Answer:   Elonda Husky, LUTHER H [[id number]]   fluconazole (DIFLUCAN) 150 MG tablet    Sig: Take 1 now and 1 in 3 days    Dispense:  2 tablet    Refill:  1    Order Specific Question:   Supervising Provider    Answer:   Florian Buff [[id number]]   megestrol (MEGACE) 40 MG tablet    Sig: Take 1 tablet (40 mg total) by mouth 2 (two) times daily.    Dispense:  60 tablet    Refill:  1    Order Specific Question:   Supervising Provider    Answer:   Florian Buff [[id number]]   Message me next week on if feeling any better She is aware can have removed if desired.   2. Nexplanon in place Placed 04/06/22   She requests megace if has any bleeding  Plan:     Follow up prn

## 2022-07-06 ENCOUNTER — Ambulatory Visit
Admission: EM | Admit: 2022-07-06 | Discharge: 2022-07-06 | Disposition: A | Discharge status: Home / Self Care | Payer: Self-pay | Attending: Nurse Practitioner | Admitting: Nurse Practitioner

## 2022-07-06 DIAGNOSIS — S20461A Insect bite (nonvenomous) of right back wall of thorax, initial encounter: Secondary | ICD-10-CM

## 2022-07-06 DIAGNOSIS — W57XXXA Bitten or stung by nonvenomous insect and other nonvenomous arthropods, initial encounter: Secondary | ICD-10-CM

## 2022-07-06 MED ORDER — DOXYCYCLINE HYCLATE 100 MG PO CAPS: 200.0000 mg | ORAL_CAPSULE | Freq: Once | ORAL | 0 refills | Status: AC

## 2022-07-06 NOTE — ED Triage Notes (Signed)
Pt presents with tick bite that may be infected on her back. Pt sister took tick off her back last night, pt does not know how long it was there, now patient has ring around bite and did feel fatigued this morning.

## 2022-07-06 NOTE — Discharge Instructions (Signed)
Keep the tick bite area clean and dry.  Take the one-time dose of doxycycline to prevent tickborne illness.  Seek care emergently if you develop neck stiffness, headache, new muscle pain/joint aches, or fever 14 days after tick bite.

## 2022-07-07 NOTE — ED Provider Notes (Signed)
[facility]    CSN: [id number] Arrival date & time: 07/06/22  1734      History   Chief Complaint No chief complaint on file.   HPI Kimberly Norris is a 27 y.o. female.   Patient presents today with tick bite to her back that she is concerned about.  Reports her sister removed the tick last night and patient does not know how long it was there for.  Reports she is concerned that she has a ring around the tick bite and she has been more tired today.  She denies fevers, new muscle pain/joint aches, headache, or neck stiffness.  No nausea/vomiting, shortness of breath, or or new throat/tongue swelling.  She applied mupirocin ointment which made it burn.    Past Medical History:  Diagnosis Date   Bilateral chronic knee pain 04/16/2012   GERD (gastroesophageal reflux disease)    Heartburn    Liver disease    fatty liver   Mental disorder    anxiety, depression   Rhabdomyolysis     Patient Active Problem List   Diagnosis Date Noted   Pain in left upper arm 04/11/2022   Nexplanon insertion 04/06/2022   General counseling and advice for contraceptive management 03/13/2022   Pregnancy examination or test, negative result 03/13/2022   Rectal bleeding 12/02/2020   Grade III hemorrhoids 11/16/2020   Family history of breast cancer in mother 11/03/2020   Nipple pain 11/03/2020   Nexplanon in place 11/03/2020   Adjustment disorder with emotional disturbance    Self-injurious behavior    Dysphagia 04/29/2020   Abdominal wall bulge 04/29/2020   Alternating constipation and diarrhea 03/17/2020   RUQ abdominal pain 03/17/2020   Anal fissure 02/17/2020   Skin tag of perianal region 02/17/2020   Cold sore 12/18/2019   Gastroesophageal reflux disease 08/18/2019   Fatty liver 08/18/2019   Traumatic rhabdomyolysis (HCC) 07/12/2015   Rhabdomyolysis 07/12/2015   Acquired genu valgum, 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  All pathology was benign.    HEMORRHOID SURGERY N/A 12/01/2020   Procedure: HEMORRHOIDECTOMY; SIMPLE;  Surgeon: [name], MD;  Location: [facility];  Service: General;  Laterality: N/A;   NO PAST SURGERIES      OB History     Gravida  0   Para  0   Term  0   Preterm  0   AB  0   Living  0      SAB  0   IAB  0   Ectopic  0   Multiple  0   Live Births  0            Home Medications    Prior to Admission medications   Medication Sig Start Date End Date Taking? Authorizing Provider  calcium carbonate (TUMS - DOSED IN MG ELEMENTAL CALCIUM) 500 MG chewable tablet Chew 1 tablet by mouth daily as needed for heartburn. As needed   Yes [provider]  Clindamycin-Benzoyl Per, Refr, gel Apply 1 Application topically 2 (two) times daily. 12/14/21  Yes Leath-Warren, Sadie Haber, NP  ergocalciferol (VITAMIN D2) 1.25 MG (50000 UT) capsule 1 capsule Orally ONCE WEEKLY 01/10/22  Yes [provider]  fluticasone (FLONASE) 50 MCG/ACT nasal spray Place 1 spray into both nostrils 2 (two) times daily. 01/10/22  Yes Particia Nearing, PA-C  Metamucil Fiber CHEW Chew 2 each by mouth daily.   Yes [provider]  pantoprazole (PROTONIX) 40 MG tablet 1 tablet Orally Once a day for 30 day(s) 01/03/22  Yes [provider]  Probiotic Product (PROBIOTIC DAILY PO) Take 1 each by mouth daily.   Yes [provider]  etonogestrel (NEXPLANON) 68 MG IMPL implant 1 each by Subdermal route once.     [provider]  megestrol (MEGACE) 40 MG tablet Take 1 tablet (40 mg total) by mouth 2 (two) times daily. 04/11/22   Adline Potter, NP  ondansetron (ZOFRAN-ODT) 4 MG disintegrating tablet Take 1 tablet (4 mg total) by mouth every 8 (eight) hours as needed for nausea or vomiting. 03/16/22   Particia Nearing, PA-C    Family History Family History  Problem Relation Age of Onset   Breast cancer Mother 76   Autism Sister        1 sister    Social History Social History   Tobacco Use   Smoking status: Every Day    Packs/day: 0.25    Years: 5.00    Additional pack years: 0.00    Total pack years: 1.25    Types: Cigarettes   Smokeless tobacco: Never   Tobacco comments:    smokes 1-2 cig daily  Vaping Use   Vaping Use: Never used  Substance Use Topics   Alcohol use: Not Currently   Drug use: No     Allergies   Other, Flagyl [metronidazole], and Tinidazole   Review of Systems Review of Systems Per HPI  Physical Exam Triage Vital Signs ED Triage Vitals  Enc Vitals Group     BP 07/06/22 1851 132/82     Pulse Rate 07/06/22 1851 73     Resp 07/06/22 1851 18     Temp 07/06/22 1851 98.2 F (36.8 C)     Temp Source 07/06/22 1851 Oral     SpO2 07/06/22 1851 98 %     Weight --      Height --      Head Circumference --      Peak Flow --      Pain Score 07/06/22 1853 0     Pain Loc --      Pain Edu? --      Excl. in GC? --    No data found.  Updated Vital Signs BP 132/82 (BP Location: Right Arm)   Pulse 73   Temp 98.2 F (36.8 C) (Oral)   Resp 18   SpO2 98%   Visual Acuity Right Eye Distance:   Left Eye Distance:   Bilateral Distance:    Right Eye Near:   Left Eye Near:    Bilateral Near:     Physical Exam Vitals and nursing note reviewed.  Constitutional:      General: She is not in acute distress.    Appearance: Normal appearance. She is not toxic-appearing.  HENT:     Head: Normocephalic and atraumatic.     Mouth/Throat:      Mouth: Mucous membranes are moist.     Pharynx: Oropharynx is clear.  Eyes:     General: No scleral icterus.  Extraocular Movements: Extraocular movements intact.  Pulmonary:     Effort: Pulmonary effort is normal. No respiratory distress.  Musculoskeletal:     Cervical back: Normal range of motion.  Lymphadenopathy:     Cervical: No cervical adenopathy.  Skin:    General: Skin is warm and dry.     Capillary Refill: Capillary refill takes less than 2 seconds.     Findings: Lesion present.     Comments: Macular, hyperpigmented lesion to upper right back approximately 1 cm x 0.5 cm.  No erythema, central clearing, edema, warmth, fluctuance, or active drainage.  Neurological:     Mental Status: She is alert and oriented to person, place, and time.  Psychiatric:        Behavior: Behavior is cooperative.      UC Treatments / Results  Labs (all labs ordered are listed, but only abnormal results are displayed) Labs Reviewed - No data to display  EKG   Radiology No results found.  Procedures Procedures (including critical care time)  Medications Ordered in UC Medications - No data to display  Initial Impression / Assessment and Plan / UC Course  I have reviewed the triage vital signs and the nursing notes.  Pertinent labs & imaging results that were available during my care of the patient were reviewed by me and considered in my medical decision making (see chart for details).   Patient is well-appearing, normotensive, afebrile, not tachycardic, not tachypneic, oxygenating well on room air.    1. Tick bite of right back wall of thorax, initial encounter Since tick was removed in the past 24 hours, will treat with prophylactic doxycycline 200 mg Wound care discussed Strict ER precautions discussed with patient  The patient was given the opportunity to ask questions.  All questions answered to their satisfaction.  The patient is in agreement to this plan.    Final  Clinical Impressions(s) / UC Diagnoses   Final diagnoses:  Tick bite of right back wall of thorax, initial encounter     Discharge Instructions      Keep the tick bite area clean and dry.  Take the one-time dose of doxycycline to prevent tickborne illness.  Seek care emergently if you develop neck stiffness, headache, new muscle pain/joint aches, or fever 14 days after tick bite.    ED Prescriptions     Medication Sig Dispense Auth. Provider   doxycycline (VIBRAMYCIN) 100 MG capsule Take 2 capsules (200 mg total) by mouth once for 1 dose. 2 capsule Valentino Nose, NP      PDMP not reviewed this encounter.   Valentino Nose, NP 07/07/22 1226

## 2022-07-27 ENCOUNTER — Ambulatory Visit
Admission: EM | Admit: 2022-07-27 | Discharge: 2022-07-27 | Disposition: A | Discharge status: Home / Self Care | Payer: Self-pay | Attending: Nurse Practitioner | Admitting: Nurse Practitioner

## 2022-07-27 ENCOUNTER — Encounter: Payer: Self-pay | Admitting: Emergency Medicine

## 2022-07-27 DIAGNOSIS — R519 Headache, unspecified: Secondary | ICD-10-CM

## 2022-07-27 MED ORDER — KETOROLAC TROMETHAMINE 30 MG/ML IJ SOLN
30.0000 mg | Freq: Once | INTRAMUSCULAR | Status: AC
  Administered 2022-07-27: 30 mg via INTRAMUSCULAR

## 2022-07-27 MED ORDER — DEXAMETHASONE SODIUM PHOSPHATE 10 MG/ML IJ SOLN
10.0000 mg | INTRAMUSCULAR | Status: AC
  Administered 2022-07-27: 10 mg via INTRAMUSCULAR

## 2022-07-27 MED ORDER — IBUPROFEN 800 MG PO TABS: 800.0000 mg | ORAL_TABLET | Freq: Three times a day (TID) | ORAL | 0 refills | Status: DC

## 2022-07-27 NOTE — Discharge Instructions (Signed)
You have been given injections of Decadron 10 mg and Toradol 30 mg. Take medication as prescribed.  You may take Tylenol 1 to 2 hours after ibuprofen for breakthrough pain or discomfort. Increase fluids and allow for plenty of rest. May apply cool cloths to the forehead to help with headache pain or discomfort. Recommend sleeping in a dimly lit room away from noise while symptoms persist. Go to the emergency department immediately if you experience slurred speech, visual changes, numbness or tingling, or weakness. Follow-up as needed.

## 2022-07-27 NOTE — ED Provider Notes (Signed)
[facility]    CSN: [id number] Arrival date & time: 07/27/22  1746      History   Chief Complaint No chief complaint on file.   HPI Kimberly Norris is a 27 y.o. female.   The history is provided by the patient.   The patient presents for complaints of headache that has been present for the past week.  Patient states headache is located behind both eyes.  She also complains of nausea, with light sensitivity and sound sensitivity.  She denies fever, chills, lightheadedness, dizziness, blurred vision, decreased vision, ear pain, ear drainage, cough, chest pain, abdominal pain, vomiting, or diarrhea.  Patient states that it has been approximately "3 months" since her previous headache.  She denies aura.  Patient reports she has been taking Tylenol and Excedrin with minimal relief.  Past Medical History:  Diagnosis Date   Bilateral chronic knee pain 04/16/2012   GERD (gastroesophageal reflux disease)    Heartburn    Liver disease    fatty liver   Mental disorder    anxiety, depression   Rhabdomyolysis     Patient Active Problem List   Diagnosis Date Noted   Pain in left upper arm 04/11/2022   Nexplanon insertion 04/06/2022   General counseling and advice for contraceptive management 03/13/2022   Pregnancy examination or test, negative result 03/13/2022   Rectal bleeding 12/02/2020   Grade III hemorrhoids 11/16/2020   Family history of breast cancer in mother 11/03/2020   Nipple pain 11/03/2020   Nexplanon in place 11/03/2020   Adjustment disorder with emotional disturbance    Self-injurious behavior    Dysphagia 04/29/2020   Abdominal wall bulge 04/29/2020   Alternating constipation and diarrhea 03/17/2020   RUQ abdominal pain 03/17/2020   Anal fissure 02/17/2020   Skin tag of perianal region 02/17/2020   Cold sore 12/18/2019   Gastroesophageal reflux disease 08/18/2019   Fatty liver 08/18/2019   Traumatic rhabdomyolysis (HCC) 07/12/2015   Rhabdomyolysis  07/12/2015   Acquired genu valgum, 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  All pathology was benign.    HEMORRHOID SURGERY N/A 12/01/2020   Procedure: HEMORRHOIDECTOMY; SIMPLE;  Surgeon: [name], MD;  Location: [facility];  Service: General;  Laterality: N/A;   NO PAST SURGERIES      OB History     Gravida  0   Para  0   Term  0   Preterm  0   AB  0   Living  0      SAB  0   IAB  0   Ectopic  0   Multiple  0   Live Births  0            Home Medications    Prior to Admission medications   Medication Sig Start Date End Date Taking? Authorizing Provider  ibuprofen (ADVIL) 800 MG tablet Take 1 tablet (800 mg total) by mouth 3 (three) times daily. 07/27/22  Yes Sheronda Parran-Warren, Sadie Haber, NP  calcium carbonate (TUMS - DOSED IN MG ELEMENTAL CALCIUM) 500 MG  chewable tablet Chew 1 tablet by mouth daily as needed for heartburn. As needed    [provider]  Clindamycin-Benzoyl Per, Refr, gel Apply 1 Application topically 2 (two) times daily. 12/14/21   Jamelle Noy-Warren, Sadie Haber, NP  ergocalciferol (VITAMIN D2) 1.25 MG (50000 UT) capsule 1 capsule Orally ONCE WEEKLY 01/10/22   [provider]  etonogestrel (NEXPLANON) 68 MG IMPL implant 1 each by Subdermal route once.    [provider]  fluticasone (FLONASE) 50 MCG/ACT nasal spray Place 1 spray into both nostrils 2 (two) times daily. 01/10/22   Particia Nearing, PA-C  megestrol (MEGACE) 40 MG tablet  Take 1 tablet (40 mg total) by mouth 2 (two) times daily. 04/11/22   Adline Potter, NP  Metamucil Fiber CHEW Chew 2 each by mouth daily.    [provider]  ondansetron (ZOFRAN-ODT) 4 MG disintegrating tablet Take 1 tablet (4 mg total) by mouth every 8 (eight) hours as needed for nausea or vomiting. 03/16/22   Particia Nearing, PA-C  pantoprazole (PROTONIX) 40 MG tablet 1 tablet Orally Once a day for 30 day(s) 01/03/22   [provider]  Probiotic Product (PROBIOTIC DAILY PO) Take 1 each by mouth daily.    [provider]    Family History Family History  Problem Relation Age of Onset   Breast cancer Mother 26   Autism Sister        1 sister    Social History Social History   Tobacco Use   Smoking status: Every Day    Current packs/day: 0.25    Average packs/day: 0.3 packs/day for 5.0 years (1.3 ttl pk-yrs)    Types: Cigarettes   Smokeless tobacco: Never   Tobacco comments:    smokes 1-2 cig daily  Vaping Use   Vaping status: Never Used  Substance Use Topics   Alcohol use: Not Currently   Drug use: No     Allergies   Other, Flagyl [metronidazole], and Tinidazole   Review of Systems Review of Systems Per HPI  Physical Exam Triage Vital Signs ED Triage Vitals  Encounter Vitals Group     BP 07/27/22 1749 (!) 143/66     Systolic BP Percentile --      Diastolic BP Percentile --      Pulse Rate 07/27/22 1749 84     Resp 07/27/22 1749 18     Temp 07/27/22 1749 98.2 F (36.8 C)     Temp Source 07/27/22 1749 Oral     SpO2 07/27/22 1749 97 %     Weight --      Height --      Head Circumference --      Peak Flow --      Pain Score 07/27/22 1750 10     Pain Loc --      Pain Education --      Exclude from Growth Chart --    No data found.  Updated Vital Signs BP (!) 143/66 (BP Location: Right Arm)   Pulse 84   Temp 98.2 F (36.8 C) (Oral)   Resp 18   SpO2 97%   Visual Acuity Right Eye Distance:   Left Eye Distance:    Bilateral Distance:    Right Eye Near:   Left Eye Near:    Bilateral Near:     Physical Exam Vitals and nursing note reviewed.  Constitutional:      General: She is not in acute distress.    Appearance:  Normal appearance.  HENT:     Head: Normocephalic.     Right Ear: Tympanic membrane, ear canal and external ear normal.     Left Ear: Tympanic membrane, ear canal and external ear normal.     Nose: Nose normal.     Mouth/Throat:     Mouth: Mucous membranes are moist.  Eyes:     Extraocular Movements: Extraocular movements intact.     Conjunctiva/sclera: Conjunctivae normal.     Pupils: Pupils are equal, round, and reactive to light.  Cardiovascular:     Rate and Rhythm: Normal rate and regular rhythm.     Pulses: Normal pulses.     Heart sounds: Normal heart sounds.  Pulmonary:     Effort: Pulmonary effort is normal. No respiratory distress.     Breath sounds: Normal breath sounds. No stridor. No wheezing, rhonchi or rales.  Abdominal:     General: Bowel sounds are normal.     Palpations: Abdomen is soft.     Tenderness: There is no abdominal tenderness.  Musculoskeletal:     Cervical back: Normal range of motion.  Lymphadenopathy:     Cervical: No cervical adenopathy.  Skin:    General: Skin is warm and dry.  Neurological:     General: No focal deficit present.     Mental Status: She is alert and oriented to person, place, and time.     GCS: GCS eye subscore is 4. GCS verbal subscore is 5. GCS motor subscore is 6.     Cranial Nerves: Cranial nerves 2-12 are intact.     Sensory: Sensation is intact.     Motor: Motor function is intact.     Coordination: Coordination is intact.     Gait: Gait is intact.  Psychiatric:        Mood and Affect: Mood normal.        Behavior: Behavior normal.      UC Treatments / Results  Labs (all labs ordered are listed, but only abnormal results are displayed) Labs Reviewed - No data to display  EKG   Radiology No results  found.  Procedures Procedures (including critical care time)  Medications Ordered in UC Medications  ketorolac (TORADOL) 30 MG/ML injection 30 mg (has no administration in time range)  dexamethasone (DECADRON) injection 10 mg (has no administration in time range)    Initial Impression / Assessment and Plan / UC Course  I have reviewed the triage vital signs and the nursing notes.  Pertinent labs & imaging results that were available during my care of the patient were reviewed by me and considered in my medical decision making (see chart for details).  The patient is well-appearing, she is in no acute distress, vital signs are stable.  Will treat headache with Decadron 10 mg IM and Toradol 30 mg IM.  Patient was prescribed ibuprofen 800 mg tablets for headache pain or discomfort.  Supportive care recommendations were provided and discussed with the patient to include increasing fluids, allowing for plenty of rest, continuing Tylenol for breakthrough pain or discomfort, using cool coughs, and sleeping in a dimly lit room while symptoms persist.  Also recommended a brat diet until nausea improves.  Patient was given strict ER follow-up precautions.  Patient was in agreement with this plan of care and verbalizes understanding.  All questions were answered.  Patient stable for discharge.   Final Clinical Impressions(s) / UC Diagnoses   Final diagnoses:  Acute intractable headache, unspecified headache type  Discharge Instructions      You have been given injections of Decadron 10 mg and Toradol 30 mg. Take medication as prescribed.  You may take Tylenol 1 to 2 hours after ibuprofen for breakthrough pain or discomfort. Increase fluids and allow for plenty of rest. May apply cool cloths to the forehead to help with headache pain or discomfort. Recommend sleeping in a dimly lit room away from noise while symptoms persist. Go to the emergency department immediately if you experience  slurred speech, visual changes, numbness or tingling, or weakness. Follow-up as needed.     ED Prescriptions     Medication Sig Dispense Auth. Provider   ibuprofen (ADVIL) 800 MG tablet Take 1 tablet (800 mg total) by mouth 3 (three) times daily. 21 tablet Chi Woodham-Warren, Sadie Haber, NP      PDMP not reviewed this encounter.   Abran Cantor, NP 07/27/22 1810

## 2022-07-27 NOTE — ED Triage Notes (Signed)
C/o of migraine x 1 week with nausea.  Has taken tylenol and Excedrin without relief.

## 2022-07-31 ENCOUNTER — Emergency Department (HOSPITAL_COMMUNITY)
Admission: EM | Admit: 2022-07-31 | Discharge: 2022-07-31 | Disposition: A | Discharge status: Home / Self Care | Payer: Self-pay | Attending: Emergency Medicine | Admitting: Emergency Medicine

## 2022-07-31 ENCOUNTER — Other Ambulatory Visit: Payer: Self-pay

## 2022-07-31 DIAGNOSIS — R197 Diarrhea, unspecified: Secondary | ICD-10-CM | POA: Insufficient documentation

## 2022-07-31 DIAGNOSIS — R1013 Epigastric pain: Secondary | ICD-10-CM | POA: Insufficient documentation

## 2022-07-31 DIAGNOSIS — R112 Nausea with vomiting, unspecified: Secondary | ICD-10-CM | POA: Insufficient documentation

## 2022-07-31 DIAGNOSIS — F17210 Nicotine dependence, cigarettes, uncomplicated: Secondary | ICD-10-CM | POA: Insufficient documentation

## 2022-07-31 LAB — CBC
HCT: 36.8 % (ref 36.0–46.0)
Hemoglobin: 12.5 g/dL (ref 12.0–15.0)
MCH: 32.4 pg (ref 26.0–34.0)
MCHC: 34 g/dL (ref 30.0–36.0)
MCV: 95.3 fL (ref 80.0–100.0)
Platelets: 335 10*3/uL (ref 150–400)
RBC: 3.86 MIL/uL — ABNORMAL LOW (ref 3.87–5.11)
RDW: 12.7 % (ref 11.5–15.5)
WBC: 14.7 10*3/uL — ABNORMAL HIGH (ref 4.0–10.5)
nRBC: 0 % (ref 0.0–0.2)

## 2022-07-31 LAB — URINALYSIS, ROUTINE W REFLEX MICROSCOPIC
Bilirubin Urine: NEGATIVE
Glucose, UA: NEGATIVE mg/dL
Hgb urine dipstick: NEGATIVE
Ketones, ur: NEGATIVE mg/dL
Leukocytes,Ua: NEGATIVE
Nitrite: NEGATIVE
Protein, ur: NEGATIVE mg/dL
Specific Gravity, Urine: 1.01 (ref 1.005–1.030)
pH: 6 (ref 5.0–8.0)

## 2022-07-31 LAB — COMPREHENSIVE METABOLIC PANEL
ALT: 28 U/L (ref 0–44)
AST: 18 U/L (ref 15–41)
Albumin: 3.5 g/dL (ref 3.5–5.0)
Alkaline Phosphatase: 66 U/L (ref 38–126)
Anion gap: 9 (ref 5–15)
BUN: 14 mg/dL (ref 6–20)
CO2: 22 mmol/L (ref 22–32)
Calcium: 8.4 mg/dL — ABNORMAL LOW (ref 8.9–10.3)
Chloride: 107 mmol/L (ref 98–111)
Creatinine, Ser: 0.76 mg/dL (ref 0.44–1.00)
GFR, Estimated: >60 mL/min (ref >60)
Glucose, Bld: 96 mg/dL (ref 70–99)
Potassium: 3.6 mmol/L (ref 3.5–5.1)
Sodium: 138 mmol/L (ref 135–145)
Total Bilirubin: 0.6 mg/dL (ref 0.3–1.2)
Total Protein: 6.5 g/dL (ref 6.5–8.1)

## 2022-07-31 LAB — PREGNANCY, URINE: Preg Test, Ur: NEGATIVE

## 2022-07-31 LAB — LIPASE, BLOOD: Lipase: 28 U/L (ref 11–51)

## 2022-07-31 MED ORDER — SODIUM CHLORIDE 0.9 % IV BOLUS
1000.0000 mL | Freq: Once | INTRAVENOUS | Status: AC
  Administered 2022-07-31: 1000 mL via INTRAVENOUS

## 2022-07-31 MED ORDER — ONDANSETRON 4 MG PO TBDP: 4.0000 mg | ORAL_TABLET | Freq: Three times a day (TID) | ORAL | 0 refills | Status: DC | PRN

## 2022-07-31 MED ORDER — DICYCLOMINE HCL 20 MG PO TABS: 20.0000 mg | ORAL_TABLET | Freq: Two times a day (BID) | ORAL | 0 refills | Status: DC

## 2022-07-31 MED ORDER — ONDANSETRON HCL 4 MG/2ML IJ SOLN
4.0000 mg | Freq: Once | INTRAMUSCULAR | Status: AC
  Administered 2022-07-31: 4 mg via INTRAVENOUS
  Filled 2022-07-31: qty 2

## 2022-07-31 MED ORDER — LOPERAMIDE HCL 2 MG PO CAPS: 2.0000 mg | ORAL_CAPSULE | Freq: Four times a day (QID) | ORAL | 0 refills | Status: DC | PRN

## 2022-07-31 NOTE — ED Triage Notes (Signed)
Pt states she has had abdominal pain, nausea and vomiting for a week. Has not been able to keep anything down including water.

## 2022-07-31 NOTE — Discharge Instructions (Signed)
You were evaluated in the Emergency Department and after careful evaluation, we did not find any emergent condition requiring admission or further testing in the hospital.  Your exam/testing today is overall reassuring.  Symptoms likely due to a stomach bug.  Use the Zofran as needed for nausea, use the Bentyl as needed for crampy abdominal pain.  Use the Imodium as needed for diarrhea.  Please return to the Emergency Department if you experience any worsening of your condition.   Thank you for allowing [place] to be a part of your care.

## 2022-07-31 NOTE — ED Triage Notes (Signed)
Pt also states she had a recent tick bite about a month ago and was placed on antibiotics and completed the treatment.

## 2022-07-31 NOTE — ED Provider Notes (Signed)
[facility] DEPT [facility] Emergency Department Provider Note MRN:  [id number]  Arrival date & time: 07/31/22     Chief Complaint   Abdominal Pain   History of Present Illness   Kimberly Norris is a 27 y.o. year-old female with a history of GERD presenting to the ED with chief complaint of abdominal pain.  Epigastric abdominal discomfort with nausea vomiting and diarrhea for the past week.  Denies fever, no chest pain or shortness of breath, no vaginal bleeding or discharge.  Review of Systems  A thorough review of systems was obtained and all systems are negative except as noted in the HPI and PMH.   Patient's Health History    Past Medical History:  Diagnosis Date   Bilateral chronic knee pain 04/16/2012   GERD (gastroesophageal reflux disease)    Heartburn    Liver disease    fatty liver   Mental disorder    anxiety, depression   Rhabdomyolysis     Past Surgical History:  Procedure Laterality Date   BIOPSY  09/11/2019   Procedure: BIOPSY;  Surgeon: Lanelle Bal, DO;  Location: [facility] ENDO SUITE;  Service: Endoscopy;;   ESOPHAGOGASTRODUODENOSCOPY (EGD) WITH PROPOFOL N/A 09/11/2019   Procedure: ESOPHAGOGASTRODUODENOSCOPY (EGD) WITH PROPOFOL;  Surgeon: Lanelle Bal, DO;  Esophageal mucosal changes suspicious for eosinophilic esophagitis s/p biopsy, gastritis s/p biopsy, normal examined duodenum biopsy.  All pathology was benign.    HEMORRHOID SURGERY N/A 12/01/2020   Procedure: HEMORRHOIDECTOMY; SIMPLE;  Surgeon: [name], MD;  Location: [facility] ORS;  Service: General;  Laterality: N/A;   NO PAST SURGERIES      Family History  Problem Relation Age of Onset   Breast cancer Mother 36   Autism Sister        1 sister    Social History   Socioeconomic History   Marital status: Single    Spouse name: Not on file   Number of children: Not on file   Years of education: Not on file   Highest education level: Not on file  Occupational History   Not  on file  Tobacco Use   Smoking status: Every Day    Current packs/day: 0.25    Average packs/day: 0.3 packs/day for 5.0 years (1.3 ttl pk-yrs)    Types: Cigarettes   Smokeless tobacco: Never   Tobacco comments:    smokes 1-2 cig daily  Vaping Use   Vaping status: Never Used  Substance and Sexual Activity   Alcohol use: Not Currently   Drug use: No   Sexual activity: Not Currently    Birth control/protection: Abstinence, Implant  Other Topics Concern   Not on file  Social History Narrative   Not on file   Social Determinants of Health   Financial Resource Strain: Medium Risk (11/03/2020)   Overall Financial Resource Strain (CARDIA)    Difficulty of Paying Living Expenses: Somewhat hard  Food Insecurity: No Food Insecurity (11/03/2020)   Hunger Vital Sign    Worried About Running Out of Food in the Last Year: Never true    Ran Out of Food in the Last Year: Never true  Transportation Needs: No Transportation Needs (11/03/2020)   PRAPARE - Administrator, Civil Service (Medical): No    Lack of Transportation (Non-Medical): No  Physical Activity: Inactive (11/03/2020)   Exercise Vital Sign    Days of Exercise per Week: 0 days    Minutes of Exercise per Session: 0 min  Stress: Unknown (11/03/2020)  [employer] of Occupational Health - Occupational Stress Questionnaire    Feeling of Stress : Patient declined  Social Connections: Socially Isolated (11/03/2020)   Social Connection and Isolation Panel [NHANES]    Frequency of Communication with Friends and Family: More than three times a week    Frequency of Social Gatherings with Friends and Family: Once a week    Attends Religious Services: Never    Database administrator or Organizations: No    Attends Banker Meetings: Never    Marital Status: Never married  Intimate Partner Violence: Not At Risk (11/03/2020)   Humiliation, Afraid, Rape, and Kick questionnaire    Fear of Current or  Ex-Partner: No    Emotionally Abused: No    Physically Abused: No    Sexually Abused: No     Physical Exam   Vitals:   07/31/22 0047  BP: 129/68  Pulse: 77  Resp: 18  Temp: 98.8 F (37.1 C)    CONSTITUTIONAL: Well-appearing, NAD NEURO/PSYCH:  Alert and oriented x 3, no focal deficits, flat affect EYES:  eyes equal and reactive ENT/NECK:  no LAD, no JVD CARDIO: Regular rate, well-perfused, normal S1 and S2 PULM:  CTAB no wheezing or rhonchi GI/GU:  non-distended, non-tender MSK/SPINE:  No gross deformities, no edema SKIN:  no rash, atraumatic   *Additional and/or pertinent findings included in MDM below  Diagnostic and Interventional Summary    EKG Interpretation Date/Time:    Ventricular Rate:    PR Interval:    QRS Duration:    QT Interval:    QTC Calculation:   R Axis:      Text Interpretation:         Labs Reviewed  COMPREHENSIVE METABOLIC PANEL - Abnormal; Notable for the following components:      Result Value   Calcium 8.4 (*)    All other components within normal limits  CBC - Abnormal; Notable for the following components:   WBC 14.7 (*)    RBC 3.86 (*)    All other components within normal limits  LIPASE, BLOOD  URINALYSIS, ROUTINE W REFLEX MICROSCOPIC  PREGNANCY, URINE    No orders to display    Medications  sodium chloride 0.9 % bolus 1,000 mL (1,000 mLs Intravenous New Bag/Given 07/31/22 0132)  ondansetron (ZOFRAN) injection 4 mg (4 mg Intravenous Given 07/31/22 0133)     Procedures  /  Critical Care Procedures  ED Course and Medical Decision Making  Initial Impression and Ddx Suspect viral gastroenteritis, abdomen completely soft and nontender with no rebound guarding or rigidity.  Other considerations include dehydration, acute kidney injury, electrolyte disturbance.  Past medical/surgical history that increases complexity of ED encounter: None  Interpretation of Diagnostics I personally reviewed the laboratory assessment and my  interpretation is as follows: No significant blood count or electrolyte disturbance, mild leukocytosis.    Patient Reassessment and Ultimate Disposition/Management     Patient feeling better after fluids, Zofran.  Laboratory evaluation very reassuring.  Continued reassuring abdominal exam.  Currently no indication for imaging, patient comfortable going home with symptomatic management, strict return precautions.  Favoring viral illness.  Patient management required discussion with the following services or consulting groups:  None  Complexity of Problems Addressed Acute illness or injury that poses threat of life of bodily function  Additional Data Reviewed and Analyzed Further history obtained from: None  Additional Factors Impacting ED Encounter Risk Prescriptions  Elmer Sow. Pilar Plate, MD [facility] Emergency [facility]  Health [email or web address]  Final Clinical Impressions(s) / ED Diagnoses     ICD-10-CM   1. Nausea vomiting and diarrhea  R11.2    R19.7       ED Discharge Orders          Ordered    ondansetron (ZOFRAN-ODT) 4 MG disintegrating tablet  Every 8 hours PRN        07/31/22 0251    loperamide (IMODIUM) 2 MG capsule  4 times daily PRN        07/31/22 0251    dicyclomine (BENTYL) 20 MG tablet  2 times daily        07/31/22 0251             Discharge Instructions Discussed with and Provided to Patient:     Discharge Instructions      You were evaluated in the Emergency Department and after careful evaluation, we did not find any emergent condition requiring admission or further testing in the hospital.  Your exam/testing today is overall reassuring.  Symptoms likely due to a stomach bug.  Use the Zofran as needed for nausea, use the Bentyl as needed for crampy abdominal pain.  Use the Imodium as needed for diarrhea.  Please return to the Emergency Department if you experience any worsening of your condition.   Thank you for  allowing [place] to be a part of your care.       Sabas Sous, MD 07/31/22 [phone]

## 2022-09-05 ENCOUNTER — Encounter (HOSPITAL_COMMUNITY): Payer: Self-pay | Admitting: Emergency Medicine

## 2022-09-05 ENCOUNTER — Emergency Department (HOSPITAL_COMMUNITY)
Admission: EM | Admit: 2022-09-05 | Discharge: 2022-09-05 | Disposition: A | Discharge status: Home / Self Care | Payer: Self-pay | Attending: Emergency Medicine | Admitting: Emergency Medicine

## 2022-09-05 ENCOUNTER — Other Ambulatory Visit: Payer: Self-pay

## 2022-09-05 DIAGNOSIS — U071 COVID-19: Secondary | ICD-10-CM | POA: Insufficient documentation

## 2022-09-05 MED ORDER — NAPROXEN 500 MG PO TABS: 500.0000 mg | ORAL_TABLET | Freq: Two times a day (BID) | ORAL | 0 refills | Status: DC

## 2022-09-05 MED ORDER — BENZONATATE 100 MG PO CAPS: 100.0000 mg | ORAL_CAPSULE | Freq: Three times a day (TID) | ORAL | 0 refills | Status: DC

## 2022-09-05 MED ORDER — KETOROLAC TROMETHAMINE 60 MG/2ML IM SOLN
60.0000 mg | Freq: Once | INTRAMUSCULAR | Status: AC
  Administered 2022-09-05: 60 mg via INTRAMUSCULAR
  Filled 2022-09-05: qty 2

## 2022-09-05 NOTE — ED Provider Notes (Signed)
[place] EMERGENCY DEPARTMENT AT [facility] Provider Note   CSN: [id number] Arrival date & time: 09/05/22  2113     History  Chief Complaint  Patient presents with   Generalized Body Aches   Headache    Kimberly Norris is a 27 y.o. female.   Headache  This patient is otherwise healthy 27 year old female denies any chronic medical conditions presenting with a complaint of a fever that started earlier today as well as some bodyaches, she has minimal headache, she did have nausea and vomiting and vomited twice but is no longer nauseated, no diarrhea, no abdominal pain no chest pain no sore throat no runny nose.  Both of her siblings who she has seen in the last week of had COVID-19.  She denies any coughing at this time.    Home Medications Prior to Admission medications   Medication Sig Start Date End Date Taking? Authorizing Provider  benzonatate (TESSALON) 100 MG capsule Take 1 capsule (100 mg total) by mouth every 8 (eight) hours. 09/05/22  Yes Eber Hong, MD  naproxen (NAPROSYN) 500 MG tablet Take 1 tablet (500 mg total) by mouth 2 (two) times daily with a meal. 09/05/22  Yes Eber Hong, MD  calcium carbonate (TUMS - DOSED IN MG ELEMENTAL CALCIUM) 500 MG chewable tablet Chew 1 tablet by mouth daily as needed for heartburn. As needed    [provider]  Clindamycin-Benzoyl Per, Refr, gel Apply 1 Application topically 2 (two) times daily. 12/14/21   Leath-Warren, Sadie Haber, NP  dicyclomine (BENTYL) 20 MG tablet Take 1 tablet (20 mg total) by mouth 2 (two) times daily. 07/31/22   Sabas Sous, MD  ergocalciferol (VITAMIN D2) 1.25 MG (50000 UT) capsule 1 capsule Orally ONCE WEEKLY 01/10/22   [provider]  etonogestrel (NEXPLANON) 68 MG IMPL implant 1 each by Subdermal route once.    [provider]  fluticasone (FLONASE) 50 MCG/ACT nasal spray Place 1 spray into both nostrils 2 (two) times daily. 01/10/22   Particia Nearing, PA-C   ibuprofen (ADVIL) 800 MG tablet Take 1 tablet (800 mg total) by mouth 3 (three) times daily. 07/27/22   Leath-Warren, Sadie Haber, NP  loperamide (IMODIUM) 2 MG capsule Take 1 capsule (2 mg total) by mouth 4 (four) times daily as needed for diarrhea or loose stools. 07/31/22   Sabas Sous, MD  megestrol (MEGACE) 40 MG tablet Take 1 tablet (40 mg total) by mouth 2 (two) times daily. 04/11/22   Adline Potter, NP  Metamucil Fiber CHEW Chew 2 each by mouth daily.    [provider]  ondansetron (ZOFRAN-ODT) 4 MG disintegrating tablet Take 1 tablet (4 mg total) by mouth every 8 (eight) hours as needed for nausea or vomiting. 07/31/22   Sabas Sous, MD  pantoprazole (PROTONIX) 40 MG tablet 1 tablet Orally Once a day for 30 day(s) 01/03/22   [provider]  Probiotic Product (PROBIOTIC DAILY PO) Take 1 each by mouth daily.    [provider]      Allergies    Other, Flagyl [metronidazole], and Tinidazole    Review of Systems   Review of Systems  Neurological:  Positive for headaches.  All other systems reviewed and are negative.   Physical Exam Updated Vital Signs BP 120/68 (BP Location: Right Arm)   Pulse (!) 101   Temp 100 F (37.8 C) (Oral)   Resp 16   SpO2 100%  Physical Exam Vitals and nursing  note reviewed.  Constitutional:      General: She is not in acute distress.    Appearance: She is well-developed.  HENT:     Head: Normocephalic and atraumatic.     Mouth/Throat:     Pharynx: No oropharyngeal exudate.  Eyes:     General: No scleral icterus.       Right eye: No discharge.        Left eye: No discharge.     Conjunctiva/sclera: Conjunctivae normal.     Pupils: Pupils are equal, round, and reactive to light.  Neck:     Thyroid: No thyromegaly.     Vascular: No JVD.  Cardiovascular:     Rate and Rhythm: Normal rate and regular rhythm.     Heart sounds: Normal heart sounds. No murmur heard.    No friction rub. No gallop.  Pulmonary:      Effort: Pulmonary effort is normal. No respiratory distress.     Breath sounds: Normal breath sounds. No wheezing or rales.  Abdominal:     General: Bowel sounds are normal. There is no distension.     Palpations: Abdomen is soft. There is no mass.     Tenderness: There is no abdominal tenderness.  Musculoskeletal:        General: No tenderness. Normal range of motion.     Cervical back: Normal range of motion and neck supple.  Lymphadenopathy:     Cervical: No cervical adenopathy.  Skin:    General: Skin is warm and dry.     Findings: No erythema or rash.  Neurological:     Mental Status: She is alert.     Coordination: Coordination normal.  Psychiatric:        Behavior: Behavior normal.     ED Results / Procedures / Treatments   Labs (all labs ordered are listed, but only abnormal results are displayed) Labs Reviewed - No data to display  EKG None  Radiology No results found.  Procedures Procedures    Medications Ordered in ED Medications  ketorolac (TORADOL) injection 60 mg (has no administration in time range)    ED Course/ Medical Decision Making/ A&P                                 Medical Decision Making  The patient appears well, vital signs reflect a heart rate of 94 in my exam a temperature of 100 agrees and she is normotensive with no hypoxia.  Lungs are totally clear, oropharynx is totally clear, nasal passages are totally clear but I suspect that she has COVID-19 or some other viral process.  She has no urinary symptoms, she is already under the care of her doctor for a tick bite that she had 2 weeks ago and they are looking into it though she has no other symptoms related to that tick.  I suspect this is COVID-19, she will be treated supportively, she was given Toradol IM in the ER with some improvement, she will be given Naprosyn and Tessalon for home as needed, she has been given a work note to stay out of work for another 5 days, the patient is  agreeable to the plan and stable for discharge        Final Clinical Impression(s) / ED Diagnoses Final diagnoses:  COVID-19    Rx / DC Orders ED Discharge Orders          Ordered  naproxen (NAPROSYN) 500 MG tablet  2 times daily with meals        09/05/22 2130    benzonatate (TESSALON) 100 MG capsule  Every 8 hours        09/05/22 2130              Eber Hong, MD 09/05/22 2132

## 2022-09-05 NOTE — Discharge Instructions (Signed)
We no longer test for COVID-19 in routine cases, we will assume that you do have it given your exposures.  You may take the following medication to help with your symptoms  For body aches you can take Naprosyn.  Please take Naprosyn, 500mg  by mouth twice daily as needed for pain - this in an antiinflammatory medicine (NSAID) and is similar to ibuprofen - many people feel that it is stronger than ibuprofen and it is easier to take since it is a smaller pill.  Please use this only for 1 week - if your pain persists, you will need to follow up with your doctor in the office for ongoing guidance and pain control.  For coughing you can take benzonatate, Tessalon is a cough medication that helps reduce the amount of coughing that you are having.  You may take up to 200 mg every 8 hours as needed.  This can be used safely with most or other over-the-counter medications but talk to your pharmacist before taking anything else over-the-counter with it  Thank you for allowing [place] to treat you in the emergency department today.  After reviewing your examination and potential testing that was done it appears that you are safe to go home.  I would like for you to follow-up with your doctor within the next several days, have them obtain your results and follow-up with them to review all of these tests.  If you should develop severe or worsening symptoms return to the emergency department immediately

## 2022-09-05 NOTE — ED Notes (Signed)
Pt alert, oriented, and ambulatory upon DC. Kellogg RN

## 2022-09-05 NOTE — ED Triage Notes (Signed)
Pt c/o body aches, headache, fever earlier. Took dayquil earlier. Pt ambulatory. Nad. Pt c/o n/v x2 earlier.

## 2022-12-25 ENCOUNTER — Ambulatory Visit
Admission: EM | Admit: 2022-12-25 | Discharge: 2022-12-25 | Disposition: A | Discharge status: Home / Self Care | Payer: Self-pay | Attending: Family Medicine | Admitting: Family Medicine

## 2022-12-25 DIAGNOSIS — N898 Other specified noninflammatory disorders of vagina: Secondary | ICD-10-CM | POA: Insufficient documentation

## 2022-12-25 DIAGNOSIS — K130 Diseases of lips: Secondary | ICD-10-CM | POA: Insufficient documentation

## 2022-12-25 NOTE — Discharge Instructions (Signed)
We have sent out an HSV swab today and we will be in touch if anything comes back abnormal.  Keep doing the mupirocin ointment, warm compresses, Epsom salt soaks and follow-up if worsening.  Apply Aquaphor frequently throughout the day to the lips, you may apply Neosporin or mupirocin to the corners to keep from getting infected.

## 2022-12-25 NOTE — ED Triage Notes (Signed)
Pt reports there is a bump on her vagina that is burning states she noticed it today has not been sexually active in over a year.

## 2022-12-26 NOTE — ED Provider Notes (Signed)
[facility]    CSN: [id number] Arrival date & time: 12/25/22  1839      History   Chief Complaint No chief complaint on file.   HPI Kimberly Norris is a 27 y.o. female.   Presenting today with a painful bump to the left labial region that she noticed earlier today.  She put some warm compress on it and notes that it drained some clear/yellow fluid.  Denies abnormal discharge, pelvic or abdominal pain, new sexual partners or exposures to STDs.  States has been placing some mupirocin ointment to the area with minimal relief.    Past Medical History:  Diagnosis Date   Bilateral chronic knee pain 04/16/2012   GERD (gastroesophageal reflux disease)    Heartburn    Liver disease    fatty liver   Mental disorder    anxiety, depression   Rhabdomyolysis     Patient Active Problem List   Diagnosis Date Noted   Pain in left upper arm 04/11/2022   Nexplanon insertion 04/06/2022   General counseling and advice for contraceptive management 03/13/2022   Pregnancy examination or test, negative result 03/13/2022   Rectal bleeding 12/02/2020   Grade III hemorrhoids 11/16/2020   Family history of breast cancer in mother 11/03/2020   Nipple pain 11/03/2020   Nexplanon in place 11/03/2020   Adjustment disorder with emotional disturbance    Self-injurious behavior    Dysphagia 04/29/2020   Abdominal wall bulge 04/29/2020   Alternating constipation and diarrhea 03/17/2020   RUQ abdominal pain 03/17/2020   Anal fissure 02/17/2020   Skin tag of perianal region 02/17/2020   Cold sore 12/18/2019   Gastroesophageal reflux disease 08/18/2019   Fatty liver 08/18/2019   Traumatic rhabdomyolysis (HCC) 07/12/2015   Rhabdomyolysis 07/12/2015   Acquired genu valgum, 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  All pathology was benign.    HEMORRHOID SURGERY N/A 12/01/2020   Procedure: HEMORRHOIDECTOMY; SIMPLE;  Surgeon: [name], MD;  Location: [facility];  Service: General;  Laterality: N/A;   NO PAST SURGERIES      OB History     Gravida  0   Para  0   Term  0   Preterm  0   AB  0   Living  0      SAB  0   IAB  0   Ectopic  0   Multiple  0   Live Births  0            Home Medications    Prior to Admission medications   Medication Sig Start Date End Date Taking? Authorizing Provider  benzonatate (TESSALON) 100 MG capsule Take 1 capsule (100 mg total) by mouth every 8 (eight) hours. 09/05/22   Eber Hong, MD  calcium carbonate (TUMS - DOSED IN MG ELEMENTAL CALCIUM) 500 MG chewable tablet Chew 1 tablet by mouth daily as needed for heartburn. As needed    [provider]  Clindamycin-Benzoyl Per, Refr, gel Apply 1 Application topically 2 (two) times daily. 12/14/21   Leath-Warren,  Sadie Haber, NP  dicyclomine (BENTYL) 20 MG tablet Take 1 tablet (20 mg total) by mouth 2 (two) times daily. 07/31/22   Sabas Sous, MD  ergocalciferol (VITAMIN D2) 1.25 MG (50000 UT) capsule 1 capsule Orally ONCE WEEKLY 01/10/22   [provider]  etonogestrel (NEXPLANON) 68 MG IMPL implant 1 each by Subdermal route once.    [provider]  fluticasone (FLONASE) 50 MCG/ACT nasal spray Place 1 spray into both nostrils 2 (two) times daily. 01/10/22   Particia Nearing, PA-C  ibuprofen (ADVIL) 800 MG tablet Take 1 tablet (800 mg total) by mouth 3 (three) times daily. 07/27/22   Leath-Warren, Sadie Haber, NP  loperamide (IMODIUM) 2 MG capsule  Take 1 capsule (2 mg total) by mouth 4 (four) times daily as needed for diarrhea or loose stools. 07/31/22   Sabas Sous, MD  megestrol (MEGACE) 40 MG tablet Take 1 tablet (40 mg total) by mouth 2 (two) times daily. 04/11/22   Adline Potter, NP  Metamucil Fiber CHEW Chew 2 each by mouth daily.    [provider]  naproxen (NAPROSYN) 500 MG tablet Take 1 tablet (500 mg total) by mouth 2 (two) times daily with a meal. 09/05/22   Eber Hong, MD  ondansetron (ZOFRAN-ODT) 4 MG disintegrating tablet Take 1 tablet (4 mg total) by mouth every 8 (eight) hours as needed for nausea or vomiting. 07/31/22   Sabas Sous, MD  pantoprazole (PROTONIX) 40 MG tablet 1 tablet Orally Once a day for 30 day(s) 01/03/22   [provider]  Probiotic Product (PROBIOTIC DAILY PO) Take 1 each by mouth daily.    [provider]    Family History Family History  Problem Relation Age of Onset   Breast cancer Mother 59   Autism Sister        1 sister    Social History Social History   Tobacco Use   Smoking status: Every Day    Current packs/day: 0.25    Average packs/day: 0.3 packs/day for 5.0 years (1.3 ttl pk-yrs)    Types: Cigarettes   Smokeless tobacco: Never   Tobacco comments:    smokes 1-2 cig daily  Vaping Use   Vaping status: Never Used  Substance Use Topics   Alcohol use: Not Currently   Drug use: No     Allergies   Other, Flagyl [metronidazole], and Tinidazole   Review of Systems Review of Systems Per HPI  Physical Exam Triage Vital Signs ED Triage Vitals [12/25/22 1940]  Encounter Vitals Group     BP 132/81     Systolic BP Percentile      Diastolic BP Percentile      Pulse Rate 67     Resp 19     Temp 98.1 F (36.7 C)     Temp Source Oral     SpO2 98 %     Weight      Height      Head Circumference      Peak Flow      Pain Score 0     Pain Loc      Pain Education      Exclude from Growth Chart    No data found.  Updated Vital  Signs BP 132/81 (BP Location: Right Arm)   Pulse 67   Temp 98.1 F (36.7 C) (Oral)   Resp 19   SpO2 98%   Visual Acuity Right Eye Distance:   Left Eye Distance:  Bilateral Distance:    Right Eye Near:   Left Eye Near:    Bilateral Near:     Physical Exam Vitals and nursing note reviewed. Exam conducted with a chaperone present.  Constitutional:      Appearance: Normal appearance. She is not ill-appearing.  HENT:     Head: Atraumatic.  Eyes:     Extraocular Movements: Extraocular movements intact.     Conjunctiva/sclera: Conjunctivae normal.  Cardiovascular:     Rate and Rhythm: Normal rate and regular rhythm.     Heart sounds: Normal heart sounds.  Pulmonary:     Effort: Pulmonary effort is normal.     Breath sounds: Normal breath sounds.  Genitourinary:    Comments: Small erythematous ulceration to the left lower labial region.  Tender to palpation.  No abnormal discharge noted to vulvar region. Musculoskeletal:        General: Normal range of motion.     Cervical back: Normal range of motion and neck supple.  Skin:    General: Skin is warm and dry.  Neurological:     Mental Status: She is alert and oriented to person, place, and time.  Psychiatric:        Mood and Affect: Mood normal.        Thought Content: Thought content normal.        Judgment: Judgment normal.      UC Treatments / Results  Labs (all labs ordered are listed, but only abnormal results are displayed) Labs Reviewed  HSV CULTURE AND TYPING    EKG   Radiology No results found.  Procedures Procedures (including critical care time)  Medications Ordered in UC Medications - No data to display  Initial Impression / Assessment and Plan / UC Course  I have reviewed the triage vital signs and the nursing notes.  Pertinent labs & imaging results that were available during my care of the patient were reviewed by me and considered in my medical decision making (see chart for details).      HSV swab pending, no obvious abscess in need of drainage or distinctive infection today.  Continue good hygiene to the area, warm compresses, mupirocin ointment and await HSV results.  Adjust if needed based on these.  Return for worsening symptoms.  Final Clinical Impressions(s) / UC Diagnoses   Final diagnoses:  Vaginal lesion  Cheilitis     Discharge Instructions      We have sent out an HSV swab today and we will be in touch if anything comes back abnormal.  Keep doing the mupirocin ointment, warm compresses, Epsom salt soaks and follow-up if worsening.  Apply Aquaphor frequently throughout the day to the lips, you may apply Neosporin or mupirocin to the corners to keep from getting infected.    ED Prescriptions   None    PDMP not reviewed this encounter.   Particia Nearing, [place] 12/26/22 1946

## 2022-12-28 LAB — HSV CULTURE AND TYPING

## 2022-12-29 ENCOUNTER — Telehealth (HOSPITAL_COMMUNITY): Payer: Self-pay

## 2022-12-29 NOTE — Telephone Encounter (Signed)
Pt LVM regarding test results,   Attempted to reach patient x 1. LMV

## 2023-01-19 ENCOUNTER — Ambulatory Visit
Admission: EM | Admit: 2023-01-19 | Discharge: 2023-01-19 | Disposition: A | Discharge status: Home / Self Care | Payer: Self-pay | Attending: Nurse Practitioner | Admitting: Nurse Practitioner

## 2023-01-19 DIAGNOSIS — J069 Acute upper respiratory infection, unspecified: Secondary | ICD-10-CM

## 2023-01-19 MED ORDER — CETIRIZINE HCL 10 MG PO TABS: 10.0000 mg | ORAL_TABLET | Freq: Every day | ORAL | 0 refills | Status: DC

## 2023-01-19 MED ORDER — PSEUDOEPH-BROMPHEN-DM 30-2-10 MG/5ML PO SYRP: 5.0000 mL | ORAL_SOLUTION | Freq: Four times a day (QID) | ORAL | 0 refills | Status: DC | PRN

## 2023-01-19 MED ORDER — FLUTICASONE PROPIONATE 50 MCG/ACT NA SUSP: 2.0000 | Freq: Every day | NASAL | 0 refills | Status: DC

## 2023-01-19 NOTE — ED Provider Notes (Signed)
 [facility]    CSN: [id number] Arrival date & time: 01/19/23  1249      History   Chief Complaint Chief Complaint  Patient presents with   Cough   Nasal Congestion    HPI Kimberly Norris is a 28 y.o. female.   The history is provided by the patient.   Patient presents for complaints of cough and nasal congestion that been present for the past week.  Patient states symptoms started with a sore throat, but that has since resolved.  She denies fever, chills, ear pain, ear drainage, wheezing, difficulty breathing, chest pain, abdominal pain, nausea, vomiting, diarrhea, or rash.  Patient reports she has used several over-the-counter cough and cold medications with no relief of her symptoms.  Past Medical History:  Diagnosis Date   Bilateral chronic knee pain 04/16/2012   GERD (gastroesophageal reflux disease)    Heartburn    Liver disease    fatty liver   Mental disorder    anxiety, depression   Rhabdomyolysis     Patient Active Problem List   Diagnosis Date Noted   Pain in left upper arm 04/11/2022   Nexplanon  insertion 04/06/2022   General counseling and advice for contraceptive management 03/13/2022   Pregnancy examination or test, negative result 03/13/2022   Rectal bleeding 12/02/2020   Grade III hemorrhoids 11/16/2020   Family history of breast cancer in mother 11/03/2020   Nipple pain 11/03/2020   Nexplanon  in place 11/03/2020   Adjustment disorder with emotional disturbance    Self-injurious behavior    Dysphagia 04/29/2020   Abdominal wall bulge 04/29/2020   Alternating constipation and diarrhea 03/17/2020   RUQ abdominal pain 03/17/2020   Anal fissure 02/17/2020   Skin tag of perianal region 02/17/2020   Cold sore 12/18/2019   Gastroesophageal reflux disease 08/18/2019   Fatty liver 08/18/2019   Traumatic rhabdomyolysis (HCC) 07/12/2015   Rhabdomyolysis 07/12/2015   Acquired genu valgum, 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  All pathology was benign.    HEMORRHOID SURGERY N/A 12/01/2020   Procedure: HEMORRHOIDECTOMY; SIMPLE;  Surgeon: Kallie Manuelita BROCKS, MD;  Location: [facility];  Service: General;  Laterality: N/A;   NO PAST SURGERIES      OB History     Gravida  0   Para  0   Term  0   Preterm  0   AB  0   Living  0      SAB  0   IAB  0   Ectopic  0   Multiple  0   Live Births  0            Home Medications    Prior to Admission medications   Medication Sig Start Date End Date Taking? Authorizing Provider  brompheniramine-pseudoephedrine -DM 30-2-10 MG/5ML syrup Take 5 mLs by mouth 4 (four) times daily as needed. 01/19/23  Yes Leath-Warren, Etta PARAS, NP  cetirizine  (ZYRTEC ) 10 MG tablet Take 1 tablet (10 mg total) by mouth daily. 01/19/23  Yes Leath-Warren, Etta PARAS, NP  fluticasone  (FLONASE ) 50  MCG/ACT nasal spray Place 2 sprays into both nostrils daily. 01/19/23  Yes Leath-Warren, Etta PARAS, NP  benzonatate  (TESSALON ) 100 MG capsule Take 1 capsule (100 mg total) by mouth every 8 (eight) hours. 09/05/22   Cleotilde Rogue, MD  calcium  carbonate (TUMS - DOSED IN MG ELEMENTAL CALCIUM ) 500 MG chewable tablet Chew 1 tablet by mouth daily as needed for heartburn. As needed    [provider]  Clindamycin -Benzoyl Per, Refr, gel Apply 1 Application topically 2 (two) times daily. 12/14/21   Leath-Warren, Etta PARAS, NP  dicyclomine  (BENTYL ) 20 MG tablet Take 1 tablet (20 mg total) by mouth 2 (two) times daily.  07/31/22   Bero, Michael M, MD  ergocalciferol  (VITAMIN D2) 1.25 MG (50000 UT) capsule 1 capsule Orally ONCE WEEKLY 01/10/22   [provider]  etonogestrel  (NEXPLANON ) 68 MG IMPL implant 1 each by Subdermal route once.    [provider]  ibuprofen  (ADVIL ) 800 MG tablet Take 1 tablet (800 mg total) by mouth 3 (three) times daily. 07/27/22   Leath-Warren, Etta PARAS, NP  loperamide  (IMODIUM ) 2 MG capsule Take 1 capsule (2 mg total) by mouth 4 (four) times daily as needed for diarrhea or loose stools. 07/31/22   Theadore Ozell HERO, MD  megestrol  (MEGACE ) 40 MG tablet Take 1 tablet (40 mg total) by mouth 2 (two) times daily. 04/11/22   Signa Delon LABOR, NP  Metamucil Fiber CHEW Chew 2 each by mouth daily.    [provider]  naproxen  (NAPROSYN ) 500 MG tablet Take 1 tablet (500 mg total) by mouth 2 (two) times daily with a meal. 09/05/22   Cleotilde Rogue, MD  ondansetron  (ZOFRAN -ODT) 4 MG disintegrating tablet Take 1 tablet (4 mg total) by mouth every 8 (eight) hours as needed for nausea or vomiting. 07/31/22   Theadore Ozell HERO, MD  pantoprazole  (PROTONIX ) 40 MG tablet 1 tablet Orally Once a day for 30 day(s) 01/03/22   [provider]  Probiotic Product (PROBIOTIC DAILY PO) Take 1 each by mouth daily.    [provider]    Family History Family History  Problem Relation Age of Onset   Breast cancer Mother 11   Autism Sister        1 sister    Social History Social History   Tobacco Use   Smoking status: Every Day    Current packs/day: 0.25    Average packs/day: 0.3 packs/day for 5.0 years (1.3 ttl pk-yrs)    Types: Cigarettes   Smokeless tobacco: Never   Tobacco comments:    smokes 1-2 cig daily  Vaping Use   Vaping status: Never Used  Substance Use Topics   Alcohol use: Not Currently   Drug use: No     Allergies   Other, Flagyl [metronidazole], and Tinidazole   Review of Systems Review of Systems Per HPI  Physical Exam Triage Vital  Signs ED Triage Vitals [01/19/23 1354]  Encounter Vitals Group     BP 133/83     Systolic BP Percentile      Diastolic BP Percentile      Pulse Rate 79     Resp 16     Temp 98.3 F (36.8 C)     Temp Source Oral     SpO2 98 %     Weight      Height      Head Circumference      Peak Flow      Pain Score  Pain Loc      Pain Education      Exclude from Growth Chart    No data found.  Updated Vital Signs BP 133/83 (BP Location: Left Arm)   Pulse 79   Temp 98.3 F (36.8 C) (Oral)   Resp 16   SpO2 98%   Visual Acuity Right Eye Distance:   Left Eye Distance:   Bilateral Distance:    Right Eye Near:   Left Eye Near:    Bilateral Near:     Physical Exam Vitals and nursing note reviewed.  Constitutional:      General: She is not in acute distress.    Appearance: Normal appearance.  HENT:     Head: Normocephalic.     Right Ear: Tympanic membrane, ear canal and external ear normal.     Left Ear: Tympanic membrane, ear canal and external ear normal.     Nose: Congestion present.     Right Turbinates: Enlarged and swollen.     Left Turbinates: Enlarged and swollen.     Right Sinus: No maxillary sinus tenderness or frontal sinus tenderness.     Left Sinus: No maxillary sinus tenderness or frontal sinus tenderness.     Mouth/Throat:     Lips: Pink.     Mouth: Mucous membranes are moist.     Pharynx: Uvula midline. Posterior oropharyngeal erythema and postnasal drip present. No pharyngeal swelling, oropharyngeal exudate or uvula swelling.     Comments: Cobblestoning present to posterior oropharynx  Eyes:     Extraocular Movements: Extraocular movements intact.     Conjunctiva/sclera: Conjunctivae normal.     Pupils: Pupils are equal, round, and reactive to light.  Cardiovascular:     Rate and Rhythm: Normal rate and regular rhythm.     Pulses: Normal pulses.     Heart sounds: Normal heart sounds.  Pulmonary:     Effort: Pulmonary effort is normal. No respiratory  distress.     Breath sounds: Normal breath sounds. No stridor. No wheezing, rhonchi or rales.  Abdominal:     General: Bowel sounds are normal.     Palpations: Abdomen is soft.     Tenderness: There is no abdominal tenderness.  Musculoskeletal:     Cervical back: Normal range of motion.  Lymphadenopathy:     Cervical: No cervical adenopathy.  Skin:    General: Skin is warm and dry.  Neurological:     General: No focal deficit present.     Mental Status: She is alert and oriented to person, place, and time.  Psychiatric:        Mood and Affect: Mood normal.        Behavior: Behavior normal.      UC Treatments / Results  Labs (all labs ordered are listed, but only abnormal results are displayed) Labs Reviewed - No data to display  EKG   Radiology No results found.  Procedures Procedures (including critical care time)  Medications Ordered in UC Medications - No data to display  Initial Impression / Assessment and Plan / UC Course  I have reviewed the triage vital signs and the nursing notes.  Pertinent labs & imaging results that were available during my care of the patient were reviewed by me and considered in my medical decision making (see chart for details).  On exam, lung sounds are clear throughout, room air sats at 98%.  Symptoms consistent with viral URI with cough.  Will provide symptomatic treatment with Bromfed-DM for the  cough, cetirizine  10 mg for nasal congestion and drainage, and fluticasone  50 mcg nasal spray for nasal congestion.  Supportive care recommendations were provided and discussed with the patient to include fluids, rest, normal saline nasal spray, and use of a humidifier during sleep.  Discussed indications with the patient regarding indications for follow-up.  Patient was in agreement with this plan of care and verbalizes understanding.  All questions were answered.  Patient stable for discharge.  Final Clinical Impressions(s) / UC Diagnoses    Final diagnoses:  Viral URI with cough     Discharge Instructions      Take medication as directed. Increase fluids and get plenty of rest. May take over-the-counter Ibuprofen  or Tylenol  as needed for pain, fever, or general discomfort. Recommend normal saline nasal spray to help with nasal congestion throughout the day. For your cough, it may be helpful to use a humidifier at bedtime during sleep. If your symptoms fail to improve with this treatment, you may follow-up in this clinic or with your PCP for further evaluation.      ED Prescriptions     Medication Sig Dispense Auth. Provider   brompheniramine-pseudoephedrine -DM 30-2-10 MG/5ML syrup Take 5 mLs by mouth 4 (four) times daily as needed. 140 mL Leath-Warren, Etta PARAS, NP   fluticasone  (FLONASE ) 50 MCG/ACT nasal spray Place 2 sprays into both nostrils daily. 16 g Leath-Warren, Etta PARAS, NP   cetirizine  (ZYRTEC ) 10 MG tablet Take 1 tablet (10 mg total) by mouth daily. 30 tablet Leath-Warren, Etta PARAS, NP      PDMP not reviewed this encounter.   Gilmer Etta PARAS, NP 01/19/23 2006

## 2023-01-19 NOTE — Discharge Instructions (Signed)
 Take medication as directed. Increase fluids and get plenty of rest. May take over-the-counter Ibuprofen  or Tylenol  as needed for pain, fever, or general discomfort. Recommend normal saline nasal spray to help with nasal congestion throughout the day. For your cough, it may be helpful to use a humidifier at bedtime during sleep. If your symptoms fail to improve with this treatment, you may follow-up in this clinic or with your PCP for further evaluation.

## 2023-01-19 NOTE — ED Triage Notes (Signed)
 Patient present to the office for cough and congestion x 1 week. Patient has not taken any medication to help with symptoms.

## 2023-05-04 ENCOUNTER — Encounter: Payer: Self-pay | Admitting: Emergency Medicine

## 2023-05-04 ENCOUNTER — Ambulatory Visit
Admission: EM | Admit: 2023-05-04 | Discharge: 2023-05-04 | Disposition: A | Discharge status: Home / Self Care | Payer: MEDICAID | Attending: Family Medicine | Admitting: Family Medicine

## 2023-05-04 DIAGNOSIS — R051 Acute cough: Secondary | ICD-10-CM | POA: Diagnosis not present

## 2023-05-04 DIAGNOSIS — N611 Abscess of the breast and nipple: Secondary | ICD-10-CM | POA: Diagnosis not present

## 2023-05-04 MED ORDER — MUPIROCIN 2 % EX OINT: 1.0000 | TOPICAL_OINTMENT | Freq: Two times a day (BID) | CUTANEOUS | 0 refills | Status: AC

## 2023-05-04 MED ORDER — AMOXICILLIN-POT CLAVULANATE 875-125 MG PO TABS: 1.0000 | ORAL_TABLET | Freq: Two times a day (BID) | ORAL | 0 refills | Status: DC

## 2023-05-04 NOTE — Discharge Instructions (Signed)
 Take the course of antibiotic and apply warm compresses, mupirocin  ointment and a patch bandage to the area.  Follow-up if this worsens or does not resolve.  Take an allergy medication and nasal spray daily, keep taking Mucinex for your cough and follow-up if this does not resolve.

## 2023-05-04 NOTE — ED Triage Notes (Signed)
 Lump under right breast x 3 days

## 2023-05-04 NOTE — ED Provider Notes (Signed)
 [facility]    CSN: [id number] Arrival date & time: 05/04/23  1511      History   Chief Complaint No chief complaint on file.   HPI Kimberly Norris is a 28 y.o. female.   Lump under right breast x 3 days      Past Medical History:  Diagnosis Date   Bilateral chronic knee pain 04/16/2012   GERD (gastroesophageal reflux disease)    Heartburn    Liver disease    fatty liver   Mental disorder    anxiety, depression   Rhabdomyolysis     Patient Active Problem List   Diagnosis Date Noted   Pain in left upper arm 04/11/2022   Nexplanon  insertion 04/06/2022   General counseling and advice for contraceptive management 03/13/2022   Pregnancy examination or test, negative result 03/13/2022   Rectal bleeding 12/02/2020   Grade III hemorrhoids 11/16/2020   Family history of breast cancer in mother 11/03/2020   Nipple pain 11/03/2020   Nexplanon  in place 11/03/2020   Adjustment disorder with emotional disturbance    Self-injurious behavior    Dysphagia 04/29/2020   Abdominal wall bulge 04/29/2020   Alternating constipation and diarrhea 03/17/2020   RUQ abdominal pain 03/17/2020   Anal fissure 02/17/2020   Skin tag of perianal region 02/17/2020   Cold sore 12/18/2019   Gastroesophageal reflux disease 08/18/2019   Fatty liver 08/18/2019   Traumatic rhabdomyolysis (HCC) 07/12/2015   Rhabdomyolysis 07/12/2015   Acquired genu valgum, 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   All pathology was benign.    HEMORRHOID SURGERY N/A 12/01/2020   Procedure: HEMORRHOIDECTOMY; SIMPLE;  Surgeon: Awilda Bogus, MD;  Location: [facility];  Service: General;  Laterality: N/A;   NO PAST SURGERIES      OB History     Gravida  0   Para  0   Term  0   Preterm  0   AB  0   Living  0      SAB  0   IAB  0   Ectopic  0   Multiple  0   Live Births  0            Home Medications    Prior to Admission medications   Medication Sig Start Date End Date Taking? Authorizing Provider  amoxicillin -clavulanate (AUGMENTIN ) 875-125 MG tablet Take 1 tablet by mouth every 12 (twelve) hours. 05/04/23  Yes Corbin Dess, PA-C  mupirocin  ointment (BACTROBAN ) 2 % Apply 1 Application topically 2 (two) times daily. 05/04/23  Yes Corbin Dess, PA-C  calcium  carbonate (TUMS - DOSED IN MG ELEMENTAL CALCIUM ) 500 MG chewable tablet Chew 1 tablet by mouth daily as needed for heartburn. As needed    [provider]  Clindamycin -Benzoyl Per, Refr, gel Apply 1 Application topically 2 (two) times daily. 12/14/21   Leath-Warren, Belen Bowers, NP  ergocalciferol  (VITAMIN D2) 1.25 MG (50000 UT) capsule 1 capsule Orally ONCE WEEKLY 01/10/22   [provider]  etonogestrel  (NEXPLANON ) 68 MG IMPL implant 1 each by  Subdermal route once.    [provider]  fluticasone  (FLONASE ) 50 MCG/ACT nasal spray Place 2 sprays into both nostrils daily. 01/19/23   Leath-Warren, Belen Bowers, NP  ibuprofen  (ADVIL ) 800 MG tablet Take 1 tablet (800 mg total) by mouth 3 (three) times daily. 07/27/22   Leath-Warren, Belen Bowers, NP  megestrol  (MEGACE ) 40 MG tablet Take 1 tablet (40 mg total) by mouth 2 (two) times daily. 04/11/22   Javan Messing, NP  Metamucil Fiber CHEW Chew 2 each by mouth daily.    [provider]  naproxen  (NAPROSYN ) 500 MG tablet Take 1 tablet (500 mg total) by mouth 2 (two) times daily with a meal. 09/05/22   Early Glisson, MD   pantoprazole  (PROTONIX ) 40 MG tablet 1 tablet Orally Once a day for 30 day(s) 01/03/22   [provider]  Probiotic Product (PROBIOTIC DAILY PO) Take 1 each by mouth daily.    [provider]    Family History Family History  Problem Relation Age of Onset   Breast cancer Mother 49   Autism Sister        1 sister    Social History Social History   Tobacco Use   Smoking status: Every Day    Current packs/day: 0.25    Average packs/day: 0.3 packs/day for 5.0 years (1.3 ttl pk-yrs)    Types: Cigarettes   Smokeless tobacco: Never   Tobacco comments:    smokes 1-2 cig daily  Vaping Use   Vaping status: Never Used  Substance Use Topics   Alcohol use: Not Currently   Drug use: No     Allergies   Other, Flagyl [metronidazole], and Tinidazole   Review of Systems Review of Systems PER HPI  Physical Exam Triage Vital Signs ED Triage Vitals  Encounter Vitals Group     BP 05/04/23 1519 138/86     Systolic BP Percentile --      Diastolic BP Percentile --      Pulse Rate 05/04/23 1519 91     Resp 05/04/23 1519 18     Temp 05/04/23 1519 98.5 F (36.9 C)     Temp Source 05/04/23 1519 Oral     SpO2 05/04/23 1519 97 %     Weight --      Height --      Head Circumference --      Peak Flow --      Pain Score 05/04/23 1520 0     Pain Loc --      Pain Education --      Exclude from Growth Chart --    No data found.  Updated Vital Signs BP 138/86 (BP Location: Right Arm)   Pulse 91   Temp 98.5 F (36.9 C) (Oral)   Resp 18   SpO2 97%   Visual Acuity Right Eye Distance:   Left Eye Distance:   Bilateral Distance:    Right Eye Near:   Left Eye Near:    Bilateral Near:     Physical Exam Vitals and nursing note reviewed.  Constitutional:      Appearance: Normal appearance. She is not ill-appearing.  HENT:     Head: Atraumatic.     Nose: Nose normal.     Mouth/Throat:     Mouth: Mucous membranes are moist.  Eyes:     Extraocular Movements:  Extraocular movements intact.     Conjunctiva/sclera: Conjunctivae normal.  Cardiovascular:     Rate and Rhythm: Normal rate.  Pulmonary:     Effort: Pulmonary effort is normal.     Breath sounds: Normal breath sounds. No wheezing or rales.  Musculoskeletal:        General: Normal range of motion.     Cervical back: Normal range of motion and neck supple.  Skin:    General: Skin is warm.     Findings: Erythema present.     Comments: Small area of erythema with central pustule to the right lower breast around 7:00.  No significant abscess palpable, nonfluctuant, no active drainage or bleeding  Neurological:     Mental Status: She is alert and oriented to person, place, and time.     Motor: No weakness.     Gait: Gait normal.  Psychiatric:        Mood and Affect: Mood normal.        Thought Content: Thought content normal.        Judgment: Judgment normal.      UC Treatments / Results  Labs (all labs ordered are listed, but only abnormal results are displayed) Labs Reviewed - No data to display  EKG   Radiology No results found.  Procedures Procedures (including critical care time)  Medications Ordered in UC Medications - No data to display  Initial Impression / Assessment and Plan / UC Course  I have reviewed the triage vital signs and the nursing notes.  Pertinent labs & imaging results that were available during my care of the patient were reviewed by me and considered in my medical decision making (see chart for details).     Vital signs benign and reassuring today, will treat with Augmentin , mupirocin , warm compresses for the pustular lesion to the right breast.  Regarding her cough, suspect allergic in nature and discussed antihistamines, nasal spray, continued Mucinex and other supportive measures.  Return for worsening symptoms.  Final Clinical Impressions(s) / UC Diagnoses   Final diagnoses:  Abscess of right breast  Acute cough     Discharge  Instructions      Take the course of antibiotic and apply warm compresses, mupirocin  ointment and a patch bandage to the area.  Follow-up if this worsens or does not resolve.  Take an allergy medication and nasal spray daily, keep taking Mucinex for your cough and follow-up if this does not resolve.     ED Prescriptions     Medication Sig Dispense Auth. Provider   amoxicillin -clavulanate (AUGMENTIN ) 875-125 MG tablet Take 1 tablet by mouth every 12 (twelve) hours. 14 tablet Corbin Dess, PA-C   mupirocin  ointment (BACTROBAN ) 2 % Apply 1 Application topically 2 (two) times daily. 22 g Corbin Dess, [place]      PDMP not reviewed this encounter.   Corbin Dess, [place] 05/04/23 [phone]

## 2023-05-23 ENCOUNTER — Ambulatory Visit
Admission: RE | Admit: 2023-05-23 | Discharge: 2023-05-23 | Disposition: A | Discharge status: Home / Self Care | Payer: MEDICAID | Source: Ambulatory Visit | Attending: Nurse Practitioner | Admitting: Nurse Practitioner

## 2023-05-23 VITALS — BP 121/73 | HR 87 | Temp 98.5°F | Resp 18

## 2023-05-23 DIAGNOSIS — B372 Candidiasis of skin and nail: Secondary | ICD-10-CM | POA: Insufficient documentation

## 2023-05-23 DIAGNOSIS — N898 Other specified noninflammatory disorders of vagina: Secondary | ICD-10-CM | POA: Insufficient documentation

## 2023-05-23 MED ORDER — NYSTATIN 100000 UNIT/GM EX POWD: 1.0000 | Freq: Three times a day (TID) | CUTANEOUS | 0 refills | Status: DC

## 2023-05-23 NOTE — Discharge Instructions (Addendum)
 Place the powder under your breasts until the rash is gone. Try to keep the skin cool and dry however possible. Will let you know of any treatment changes once the vaginal swab results.

## 2023-05-23 NOTE — ED Triage Notes (Signed)
 Rash under left breast since this morning.  States rash burns.  States still has an area under right breast that has not went away.  Was seen on 4/18 for knot under right breast.

## 2023-05-23 NOTE — ED Provider Notes (Signed)
 [facility]    CSN: [id number] Arrival date & time: 05/23/23  1833      History   Chief Complaint Chief Complaint  Patient presents with   Breast Problem    Entered by patient    HPI Kimberly Norris is a 28 y.o. female.   Patient is here requesting evaluation for a rash that she noticed underneath her breasts today - states the rash hurts.  No reported new lotions, soaps, detergents, or foods.  She is also experiencing some mild vaginal discomfort, no abnormal discharge or bleeding.  Not sexually active or concerned for STIs (last activity over one year ago).  Patient just completed a course of Augmentin  for an abscess on her right breast (prescribed 05/04/23).  No other symptoms reported at this time.  The history is provided by the patient.    Past Medical History:  Diagnosis Date   Bilateral chronic knee pain 04/16/2012   GERD (gastroesophageal reflux disease)    Heartburn    Liver disease    fatty liver   Mental disorder    anxiety, depression   Rhabdomyolysis     Patient Active Problem List   Diagnosis Date Noted   Pain in left upper arm 04/11/2022   Nexplanon  insertion 04/06/2022   General counseling and advice for contraceptive management 03/13/2022   Pregnancy examination or test, negative result 03/13/2022   Rectal bleeding 12/02/2020   Grade III hemorrhoids 11/16/2020   Family history of breast cancer in mother 11/03/2020   Nipple pain 11/03/2020   Nexplanon  in place 11/03/2020   Adjustment disorder with emotional disturbance    Self-injurious behavior    Dysphagia 04/29/2020   Abdominal wall bulge 04/29/2020   Alternating constipation and diarrhea 03/17/2020   RUQ abdominal pain 03/17/2020   Anal fissure 02/17/2020   Skin tag of perianal region 02/17/2020   Cold sore 12/18/2019   Gastroesophageal reflux disease 08/18/2019   Fatty liver 08/18/2019   Traumatic rhabdomyolysis (HCC) 07/12/2015   Rhabdomyolysis 07/12/2015   Acquired genu  valgum, 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  All pathology was benign.    HEMORRHOID SURGERY N/A 12/01/2020   Procedure: HEMORRHOIDECTOMY; SIMPLE;  Surgeon: Awilda Bogus, MD;  Location: [facility];  Service: General;  Laterality: N/A;   NO PAST SURGERIES      OB History     Gravida  0   Para  0   Term  0   Preterm  0   AB  0   Living  0      SAB  0   IAB  0   Ectopic  0   Multiple  0   Live Births  0            Home Medications    Prior to Admission medications   Medication Sig Start Date End Date Taking? Authorizing Provider  nystatin (MYCOSTATIN/NYSTOP) powder Apply 1 Application topically 3 (three) times daily. Until rash resolves 05/23/23  Yes Genene Kennel, FNP  calcium  carbonate (TUMS - DOSED IN MG ELEMENTAL CALCIUM )  500 MG chewable tablet Chew 1 tablet by mouth daily as needed for heartburn. As needed    [provider]  Clindamycin -Benzoyl Per, Refr, gel Apply 1 Application topically 2 (two) times daily. 12/14/21   Leath-Warren, Belen Bowers, NP  ergocalciferol  (VITAMIN D2) 1.25 MG (50000 UT) capsule 1 capsule Orally ONCE WEEKLY 01/10/22   [provider]  etonogestrel  (NEXPLANON ) 68 MG IMPL implant 1 each by Subdermal route once.    [provider]  fluticasone  (FLONASE ) 50 MCG/ACT nasal spray Place 2 sprays into both nostrils daily. 01/19/23   Leath-Warren, Belen Bowers, NP  ibuprofen  (ADVIL ) 800 MG tablet Take 1 tablet (800 mg total) by mouth  3 (three) times daily. 07/27/22   Leath-Warren, Belen Bowers, NP  megestrol  (MEGACE ) 40 MG tablet Take 1 tablet (40 mg total) by mouth 2 (two) times daily. 04/11/22   Javan Messing, NP  Metamucil Fiber CHEW Chew 2 each by mouth daily.    [provider]  mupirocin  ointment (BACTROBAN ) 2 % Apply 1 Application topically 2 (two) times daily. 05/04/23   Corbin Dess, PA-C  pantoprazole  (PROTONIX ) 40 MG tablet 1 tablet Orally Once a day for 30 day(s) 01/03/22   [provider]  Probiotic Product (PROBIOTIC DAILY PO) Take 1 each by mouth daily.    [provider]    Family History Family History  Problem Relation Age of Onset   Breast cancer Mother 19   Autism Sister        1 sister    Social History Social History   Tobacco Use   Smoking status: Every Day    Current packs/day: 0.25    Average packs/day: 0.3 packs/day for 5.0 years (1.3 ttl pk-yrs)    Types: Cigarettes   Smokeless tobacco: Never   Tobacco comments:    smokes 1-2 cig daily  Vaping Use   Vaping status: Never Used  Substance Use Topics   Alcohol use: Not Currently   Drug use: No     Allergies   Other, Flagyl [metronidazole], and Tinidazole   Review of Systems Review of Systems  Constitutional:  Negative for chills and fever.  HENT:  Negative for congestion, rhinorrhea and sore throat.   Eyes:  Negative for pain and redness.  Respiratory:  Negative for cough and shortness of breath.   Cardiovascular:  Negative for chest pain and palpitations.  Gastrointestinal:  Negative for abdominal pain, nausea and vomiting.  Genitourinary:  Positive for vaginal pain. Negative for dysuria and urgency.  Musculoskeletal:  Negative for arthralgias and myalgias.  Skin:  Positive for rash.  Neurological:  Negative for dizziness and headaches.     Physical Exam Triage Vital Signs ED Triage Vitals  Encounter Vitals Group     BP 05/23/23 1858 121/73     Systolic BP Percentile --       Diastolic BP Percentile --      Pulse Rate 05/23/23 1858 87     Resp 05/23/23 1858 18     Temp 05/23/23 1858 98.5 F (36.9 C)     Temp Source 05/23/23 1858 Oral     SpO2 05/23/23 1858 97 %     Weight --      Height --      Head Circumference --      Peak Flow --      Pain Score 05/23/23 1859 7     Pain Loc --      Pain Education --  Exclude from Growth Chart --    No data found.  Updated Vital Signs BP 121/73 (BP Location: Right Arm)   Pulse 87   Temp 98.5 F (36.9 C) (Oral)   Resp 18   SpO2 97%   Physical Exam Vitals and nursing note reviewed.  Constitutional:      Appearance: Normal appearance.  HENT:     Head: Normocephalic.  Cardiovascular:     Rate and Rhythm: Normal rate and regular rhythm.     Heart sounds: Normal heart sounds.  Pulmonary:     Effort: Pulmonary effort is normal.     Breath sounds: Normal breath sounds.  Abdominal:     General: Bowel sounds are normal.  Skin:    General: Skin is warm and dry.     Comments: Erythematous moist candidal rash noted underneath both breasts (L>R).  Chaperoned by Elridge Haller, CMA.  Neurological:     General: No focal deficit present.     Mental Status: She is alert and oriented to person, place, and time.  Psychiatric:        Mood and Affect: Mood normal.        Behavior: Behavior normal.        Thought Content: Thought content normal.        Judgment: Judgment normal.      UC Treatments / Results  Labs (all labs ordered are listed, but only abnormal results are displayed) Labs Reviewed  CERVICOVAGINAL ANCILLARY ONLY    EKG   Radiology No results found.  Procedures Procedures (including critical care time)  Medications Ordered in UC Medications - No data to display  Initial Impression / Assessment and Plan / UC Course  I have reviewed the triage vital signs and the nursing notes.  Pertinent labs & imaging results that were available during my care of the patient were reviewed by me and  considered in my medical decision making (see chart for details).     Patient with a candidal appearing rash underneath both breasts.  Will plan topical Nystatin powder until resolved.  She also reported some vaginal discomfort - pending swab for yeast/Bacterial Vaginitis.  Will plan ongoing treatment based upon those results.  Discussed keeping skin cool and dry while possible.  Final Clinical Impressions(s) / UC Diagnoses   Final diagnoses:  Candidal dermatitis  Vaginal irritation     Discharge Instructions      Place the powder under your breasts until the rash is gone. Try to keep the skin cool and dry however possible. Will let you know of any treatment changes once the vaginal swab results.     ED Prescriptions     Medication Sig Dispense Auth. Provider   nystatin (MYCOSTATIN/NYSTOP) powder Apply 1 Application topically 3 (three) times daily. Until rash resolves 60 g Genene Kennel, FNP      PDMP not reviewed this encounter.   Genene Kennel, FNP 05/23/23 2040

## 2023-05-24 LAB — CERVICOVAGINAL ANCILLARY ONLY
Bacterial Vaginitis (gardnerella): NEGATIVE
Candida Glabrata: NEGATIVE
Candida Vaginitis: NEGATIVE
Comment: NEGATIVE
Comment: NEGATIVE
Comment: NEGATIVE

## 2023-06-14 ENCOUNTER — Ambulatory Visit: Payer: MEDICAID | Admitting: Adult Health

## 2023-06-14 ENCOUNTER — Other Ambulatory Visit (HOSPITAL_COMMUNITY)
Admission: RE | Admit: 2023-06-14 | Discharge: 2023-06-14 | Disposition: A | Discharge status: Home / Self Care | Payer: MEDICAID | Source: Ambulatory Visit | Attending: Adult Health | Admitting: Adult Health

## 2023-06-14 ENCOUNTER — Encounter: Payer: Self-pay | Admitting: Adult Health

## 2023-06-14 VITALS — BP 117/82 | HR 88 | Ht 61.0 in | Wt 198.5 lb

## 2023-06-14 DIAGNOSIS — R102 Pelvic and perineal pain: Secondary | ICD-10-CM

## 2023-06-14 DIAGNOSIS — L739 Follicular disorder, unspecified: Secondary | ICD-10-CM | POA: Diagnosis not present

## 2023-06-14 DIAGNOSIS — Z113 Encounter for screening for infections with a predominantly sexual mode of transmission: Secondary | ICD-10-CM

## 2023-06-14 DIAGNOSIS — Z124 Encounter for screening for malignant neoplasm of cervix: Secondary | ICD-10-CM | POA: Insufficient documentation

## 2023-06-14 DIAGNOSIS — Z975 Presence of (intrauterine) contraceptive device: Secondary | ICD-10-CM

## 2023-06-14 MED ORDER — DOXYCYCLINE HYCLATE 100 MG PO TABS: 100.0000 mg | ORAL_TABLET | Freq: Two times a day (BID) | ORAL | 0 refills | Status: DC

## 2023-06-14 MED ORDER — FLUCONAZOLE 150 MG PO TABS: ORAL_TABLET | ORAL | 1 refills | Status: DC

## 2023-06-14 NOTE — Progress Notes (Signed)
  Subjective:     Patient ID: Kimberly Norris, female   DOB: 31-Jan-1995, 28 y.o.   MRN: [id number]  HPI Kimberly Norris is a 28 year old white female, single, G0P0, in complaining of bump in vaginal area, and wants STD testing and needs a pap. She said PCP thought had bump that had popped, but did not culture for HSV.  PCP is Kimberly Flair FNP.  Review of Systems +bump in vaginal area, feels irritated Not currently having sex  Having some cramping  Spots brown with nexplanon  Reviewed past medical,surgical, social and family history. Reviewed medications and allergies.     Objective:   Physical Exam BP 117/82 (BP Location: Left Arm, Patient Position: Sitting, Cuff Size: Normal)   Pulse 88   Ht 5\' 1"  (1.549 m)   Wt 198 lb 8 oz (90 kg)   BMI 37.51 kg/m     Skin warm and dry.Pelvic: external genitalia is normal in appearance, has folliculitis  and razor bumps on mons pubis from shaving,is irritated under clitoral hood and white discharge, vagina: pink,urethra has no lesions or masses noted, cervix:smooth, pap with GC/CHL,trich and HR HPV genotyping performed, uterus: normal size, shape and contour, non tender, no masses felt, adnexa: no masses or tenderness noted. Bladder is non tender and no masses felt.  Upstream - 06/14/23 0957       Pregnancy Intention Screening   Does the patient want to become pregnant in the next year? No    Does the patient's partner want to become pregnant in the next year? No    Would the patient like to discuss contraceptive options today? No      Contraception Wrap Up   Current Method Abstinence;Hormonal Implant    End Method Abstinence;Hormonal Implant    Contraception Counseling Provided Yes            Examination chaperoned by Kimberly Aschoff LPN  Assessment:     1. Routine Papanicolaou smear Pap sent  Pap in 3 years if normal - Cytology - PAP( [place])  2. Nexplanon  in place Placed 04/06/22   3. Screening examination for STD (sexually transmitted  disease) Check HIV, RPR and HSV 1&2 Ab, is aware HSV not always accurate  - HIV Antibody (routine testing w rflx) - RPR - HSV 1 and 2 Ab, IgG  4. Folliculitis (Primary) Will rx doxycycline  and diflucan   Meds ordered this encounter  Medications   doxycycline  (VIBRA -TABS) 100 MG tablet    Sig: Take 1 tablet (100 mg total) by mouth 2 (two) times daily.    Dispense:  14 tablet    Refill:  0    Supervising Provider:   Evalyn Norris Norris [[id number]]   fluconazole  (DIFLUCAN ) 150 MG tablet    Sig: Take 1 now and 1 in 3 days    Dispense:  2 tablet    Refill:  1    Supervising Provider:   Randolm Norris, Kimberly Norris [[id number]]     5. Pelvic cramping If cramping persists will get US      Plan:     Follow up in 1 week to recheck vaginal area

## 2023-06-15 LAB — CYTOLOGY - PAP
Chlamydia: NEGATIVE
Comment: NEGATIVE
Comment: NEGATIVE
Comment: NEGATIVE
Comment: NORMAL
Diagnosis: NEGATIVE
High risk HPV: NEGATIVE
Neisseria Gonorrhea: NEGATIVE
Trichomonas: NEGATIVE

## 2023-06-16 LAB — RPR: RPR Ser Ql: NONREACTIVE

## 2023-06-16 LAB — HIV ANTIBODY (ROUTINE TESTING W REFLEX): HIV Screen 4th Generation wRfx: NONREACTIVE

## 2023-06-16 LAB — HSV 1 AND 2 AB, IGG
HSV 1 Glycoprotein G Ab, IgG: NONREACTIVE
HSV 2 IgG, Type Spec: NONREACTIVE

## 2023-06-18 ENCOUNTER — Ambulatory Visit: Payer: Self-pay | Admitting: Adult Health

## 2023-06-21 ENCOUNTER — Ambulatory Visit: Payer: MEDICAID | Admitting: Adult Health

## 2023-06-21 ENCOUNTER — Encounter: Payer: Self-pay | Admitting: Adult Health

## 2023-06-21 VITALS — BP 115/76 | HR 65 | Ht 61.0 in | Wt 198.5 lb

## 2023-06-21 DIAGNOSIS — L739 Follicular disorder, unspecified: Secondary | ICD-10-CM

## 2023-06-21 NOTE — Progress Notes (Signed)
  Subjective:     Patient ID: Kimberly Norris, female   DOB: May 13, 1995, 28 y.o.   MRN: [id number]  HPI Kimberly Norris is a 28 year old white female,single, G0P0, back in follow up on taking doxycycline  for folliculitis and she feels much better.     Component Value Date/Time   DIAGPAP  06/14/2023 1001    - Negative for intraepithelial lesion or malignancy (NILM)   DIAGPAP  07/25/2018 0000    NEGATIVE FOR INTRAEPITHELIAL LESIONS OR MALIGNANCY.   HPVHIGH Negative 06/14/2023 1001   ADEQPAP  06/14/2023 1001    Satisfactory for evaluation; transformation zone component PRESENT.   ADEQPAP  07/25/2018 0000    Satisfactory for evaluation  endocervical/transformation zone component ABSENT.   PCP is Deno Flair NP  Review of Systems Feels much better   Bumps have resolved Reviewed past medical,surgical, social and family history. Reviewed medications and allergies.  Objective:   Physical Exam BP 115/76 (BP Location: Left Arm, Patient Position: Sitting, Cuff Size: Normal)   Pulse 65   Ht 5\' 1"  (1.549 m)   Wt 198 lb 8 oz (90 kg)   BMI 37.51 kg/m     Skin is warm and dy, folliculitis bumps have resolved  Assessment:     1. Folliculitis (Primary) Resolved     Plan:    Finish doxycycline   Follow up prn

## 2023-08-02 ENCOUNTER — Ambulatory Visit: Payer: MEDICAID | Admitting: Adult Health

## 2023-08-28 ENCOUNTER — Encounter: Payer: Self-pay | Admitting: Adult Health

## 2023-08-28 ENCOUNTER — Ambulatory Visit (INDEPENDENT_AMBULATORY_CARE_PROVIDER_SITE_OTHER): Payer: MEDICAID | Admitting: Adult Health

## 2023-08-28 VITALS — BP 121/78 | HR 102 | Ht 61.0 in | Wt 193.0 lb

## 2023-08-28 DIAGNOSIS — B369 Superficial mycosis, unspecified: Secondary | ICD-10-CM

## 2023-08-28 MED ORDER — NYSTATIN-TRIAMCINOLONE 100000-0.1 UNIT/GM-% EX CREA: 1.0000 | TOPICAL_CREAM | Freq: Two times a day (BID) | CUTANEOUS | 1 refills | Status: AC

## 2023-08-28 MED ORDER — FLUCONAZOLE 150 MG PO TABS: ORAL_TABLET | ORAL | 1 refills | Status: DC

## 2023-08-28 NOTE — Progress Notes (Signed)
  Subjective:     Patient ID: Kimberly Norris, female   DOB: October 06, 1995, 28 y.o.   MRN: [id number]  HPI Kimberly Norris is a 28 year old white female,single, G0P0, in complaining of red rash under breasts, has used nystatin  powder without relief. And has ?bumps in vaginal at the top.     Component Value Date/Time   DIAGPAP  06/14/2023 1001    - Negative for intraepithelial lesion or malignancy (NILM)   DIAGPAP  07/25/2018 0000    NEGATIVE FOR INTRAEPITHELIAL LESIONS OR MALIGNANCY.   HPVHIGH Negative 06/14/2023 1001   ADEQPAP  06/14/2023 1001    Satisfactory for evaluation; transformation zone component PRESENT.   ADEQPAP  07/25/2018 0000    Satisfactory for evaluation  endocervical/transformation zone component ABSENT.   PCP is Silvio Ramp FNP  Review of Systems  +red rash under breasts, has used nystatin  powder without relief, it burns now  ?bumps in vaginal at the top. Not currently having sex    Reviewed past medical,surgical, social and family history. Reviewed medications and allergies.  Objective:   Physical Exam BP 121/78 (BP Location: Left Arm, Patient Position: Sitting, Cuff Size: Normal)   Pulse (!) 102   Ht 5' 1 (1.549 m)   Wt 193 lb (87.5 kg)   LMP 08/26/2023 (Exact Date)   BMI 36.47 kg/m    Skin warm and dry, has skin fungus under both breasts R>L. Pelvic: external genitalia is normal in appearance no lesions, has normal appearing papillae   Upstream - 08/28/23 1452       Pregnancy Intention Screening   Does the patient want to become pregnant in the next year? No    Does the patient's partner want to become pregnant in the next year? No    Would the patient like to discuss contraceptive options today? No      Contraception Wrap Up   Current Method Hormonal Implant    End Method Hormonal Implant    Contraception Counseling Provided Yes         Examination chaperoned by Clarita Salt LPN  Assessment:     1. Superficial fungus infection of skin (Primary) Has  skin fungus under both breasts R>L Will rx mycolog cream and diflucan  Do not use powders for now Keep clean and dry  Meds ordered this encounter  Medications   fluconazole  (DIFLUCAN ) 150 MG tablet    Sig: Take 1 now and 1 in 3 days    Dispense:  2 tablet    Refill:  1    Supervising Provider:   JAYNE MINDER H [[id number]]   nystatin -triamcinolone  (MYCOLOG II) cream    Sig: Apply 1 Application topically 2 (two) times daily.    Dispense:  30 g    Refill:  1    Supervising Provider:   JAYNE MINDER DEL [[id number]]        Plan:     Follow up prn

## 2023-08-29 ENCOUNTER — Other Ambulatory Visit: Payer: Self-pay | Admitting: Adult Health

## 2023-08-29 MED ORDER — NYSTATIN 100000 UNIT/GM EX OINT: 1.0000 | TOPICAL_OINTMENT | Freq: Two times a day (BID) | CUTANEOUS | 0 refills | Status: AC

## 2023-08-29 MED ORDER — TRIAMCINOLONE ACETONIDE 0.025 % EX OINT: 1.0000 | TOPICAL_OINTMENT | Freq: Two times a day (BID) | CUTANEOUS | 0 refills | Status: AC

## 2023-08-29 NOTE — Progress Notes (Signed)
 Will rx nystatin  and triamcinolone  separately

## 2023-09-25 ENCOUNTER — Ambulatory Visit
Admission: EM | Admit: 2023-09-25 | Discharge: 2023-09-25 | Disposition: A | Discharge status: Home / Self Care | Payer: MEDICAID

## 2023-09-25 ENCOUNTER — Encounter: Payer: Self-pay | Admitting: Emergency Medicine

## 2023-09-25 DIAGNOSIS — S56911A Strain of unspecified muscles, fascia and tendons at forearm level, right arm, initial encounter: Secondary | ICD-10-CM | POA: Diagnosis not present

## 2023-09-25 MED ORDER — BACLOFEN 10 MG PO TABS: 10.0000 mg | ORAL_TABLET | Freq: Three times a day (TID) | ORAL | 0 refills | Status: AC

## 2023-09-25 MED ORDER — DICLOFENAC SODIUM 50 MG PO TBEC: 50.0000 mg | DELAYED_RELEASE_TABLET | Freq: Two times a day (BID) | ORAL | 1 refills | Status: AC

## 2023-09-25 NOTE — ED Provider Notes (Signed)
 [facility]   Note:  This document was prepared using Dragon voice recognition software and may include unintentional dictation errors.  MRN: [id number] DOB: May 21, 1995  Subjective:   Kimberly Norris is a 28 y.o. female presenting for right forearm muscle pain, stiffness, swelling x 3 days.  Patient denies any known injury.  States that pain sometimes shoots up her arm she also occasionally has numbness and tingling to the fingers.  Patient reports that the only thing she can related to is when she had rhabdomyolysis, however symptoms are definitely not as severe now as they were when she had rhabdo.  Patient has been taking over-the-counter antiinflammatory medication with minimal improvement to symptoms.  Patient denies any other secondary medical concerns at this time.  No current facility-administered medications for this encounter.  Current Outpatient Medications:    baclofen  (LIORESAL ) 10 MG tablet, Take 1 tablet (10 mg total) by mouth 3 (three) times daily., Disp: 30 each, Rfl: 0   diclofenac  (VOLTAREN ) 50 MG EC tablet, Take 1 tablet (50 mg total) by mouth 2 (two) times daily., Disp: 30 tablet, Rfl: 1   calcium  carbonate (TUMS - DOSED IN MG ELEMENTAL CALCIUM ) 500 MG chewable tablet, Chew 1 tablet by mouth daily as needed for heartburn. As needed, Disp: , Rfl:    ergocalciferol  (VITAMIN D2) 1.25 MG (50000 UT) capsule, 1 capsule Orally ONCE WEEKLY, Disp: , Rfl:    etonogestrel  (NEXPLANON ) 68 MG IMPL implant, 1 each by Subdermal route once., Disp: , Rfl:    fluticasone  (FLONASE ) 50 MCG/ACT nasal spray, Place 2 sprays into both nostrils daily., Disp: 16 g, Rfl: 0   Metamucil Fiber CHEW, Chew 2 each by mouth daily., Disp: , Rfl:    mupirocin  ointment (BACTROBAN ) 2 %, Apply 1 Application topically 2 (two) times daily., Disp: 22 g, Rfl: 0   nystatin  ointment (MYCOSTATIN ), Apply 1 Application topically 2 (two) times daily., Disp: 30 g, Rfl: 0   nystatin -triamcinolone  (MYCOLOG II)  cream, Apply 1 Application topically 2 (two) times daily., Disp: 30 g, Rfl: 1   Probiotic Product (PROBIOTIC DAILY PO), Take 1 each by mouth daily., Disp: , Rfl:    triamcinolone  (KENALOG ) 0.025 % ointment, Apply 1 Application topically 2 (two) times daily., Disp: 30 g, Rfl: 0   Allergies  Allergen Reactions   Other Anaphylaxis    Lilly flowers   Flagyl [Metronidazole] Rash   Tinidazole Rash    Past Medical History:  Diagnosis Date   Bilateral chronic knee pain 04/16/2012   GERD (gastroesophageal reflux disease)    Heartburn    Liver disease    fatty liver   Mental disorder    anxiety, depression   Rhabdomyolysis      Past Surgical History:  Procedure Laterality Date   BIOPSY  09/11/2019   Procedure: BIOPSY;  Surgeon: Cindie Carlin POUR, DO;  Location: [facility];  Service: Endoscopy;;   ESOPHAGOGASTRODUODENOSCOPY (EGD) WITH PROPOFOL  N/A 09/11/2019   Procedure: ESOPHAGOGASTRODUODENOSCOPY (EGD) WITH PROPOFOL ;  Surgeon: Cindie Carlin POUR, DO;  Esophageal mucosal changes suspicious for eosinophilic esophagitis s/p biopsy, gastritis s/p biopsy, normal examined duodenum biopsy.  All pathology was benign.    HEMORRHOID SURGERY N/A 12/01/2020   Procedure: HEMORRHOIDECTOMY; SIMPLE;  Surgeon: Kallie Manuelita BROCKS, MD;  Location: [facility];  Service: General;  Laterality: N/A;   MOUTH SURGERY     molars removed   NO PAST SURGERIES      Family History  Problem Relation Age of Onset   Breast cancer  Mother 20   Autism Sister        1 sister    Social History   Tobacco Use   Smoking status: Every Day    Current packs/day: 0.25    Average packs/day: 0.3 packs/day for 5.0 years (1.3 ttl pk-yrs)    Types: Cigarettes   Smokeless tobacco: Never   Tobacco comments:    smokes 1-2 cig daily  Vaping Use   Vaping status: Never Used  Substance Use Topics   Alcohol use: Not Currently   Drug use: No    ROS Refer to HPI for ROS details.  Objective:   Vitals: BP 112/74 (BP  Location: Left Arm)   Pulse 97   Temp 98.3 F (36.8 C) (Oral)   Resp 18   LMP 09/17/2023 (Exact Date)   SpO2 98%   Physical Exam Vitals and nursing note reviewed.  Constitutional:      General: She is not in acute distress.    Appearance: Normal appearance. She is well-developed. She is not ill-appearing or toxic-appearing.  HENT:     Head: Normocephalic and atraumatic.  Cardiovascular:     Rate and Rhythm: Normal rate.  Pulmonary:     Effort: Pulmonary effort is normal. No respiratory distress.  Musculoskeletal:     Right forearm: Swelling and tenderness present. No deformity or bony tenderness.  Skin:    General: Skin is warm and dry.  Neurological:     General: No focal deficit present.     Mental Status: She is alert and oriented to person, place, and time.  Psychiatric:        Mood and Affect: Mood normal.        Behavior: Behavior normal.     Procedures  No results found for this or any previous visit (from the past 24 hours).  No results found.   Assessment and Plan :     Discharge Instructions       1. Muscle strain of forearm, right, initial encounter (Primary) - diclofenac  (VOLTAREN ) 50 MG EC tablet; Take 1 tablet (50 mg total) by mouth 2 (two) times daily.  Dispense: 30 tablet; Refill: 1 - baclofen  (LIORESAL ) 10 MG tablet; Take 1 tablet (10 mg total) by mouth 3 (three) times daily.  Dispense: 30 each; Refill: 0 - Apply ice to the area for 10 to 15 minutes at a time 2-3 times a day to help with inflammation and pain. - If symptoms worsen follow-up in emergency department for further evaluation and treatment.      Daelynn Blower B Nilam Quakenbush   Ingri Diemer, [place] B, NP 09/25/23 1758

## 2023-09-25 NOTE — Discharge Instructions (Signed)
  1. Muscle strain of forearm, right, initial encounter (Primary) - diclofenac  (VOLTAREN ) 50 MG EC tablet; Take 1 tablet (50 mg total) by mouth 2 (two) times daily.  Dispense: 30 tablet; Refill: 1 - baclofen  (LIORESAL ) 10 MG tablet; Take 1 tablet (10 mg total) by mouth 3 (three) times daily.  Dispense: 30 each; Refill: 0 - Apply ice to the area for 10 to 15 minutes at a time 2-3 times a day to help with inflammation and pain. - If symptoms worsen follow-up in emergency department for further evaluation and treatment.

## 2023-09-25 NOTE — ED Triage Notes (Signed)
 Right forearm pain x 3 days.  States pain shoots up arm and down to hand.  No known injury.  States arm feels stiff.

## 2023-10-24 ENCOUNTER — Ambulatory Visit: Payer: MEDICAID | Admitting: Adult Health

## 2023-11-21 ENCOUNTER — Ambulatory Visit
Admission: EM | Admit: 2023-11-21 | Discharge: 2023-11-21 | Disposition: A | Discharge status: Home / Self Care | Payer: MEDICAID | Attending: Family Medicine | Admitting: Family Medicine

## 2023-11-21 DIAGNOSIS — J069 Acute upper respiratory infection, unspecified: Secondary | ICD-10-CM | POA: Insufficient documentation

## 2023-11-21 LAB — POCT RAPID STREP A (OFFICE): Rapid Strep A Screen: NEGATIVE

## 2023-11-21 MED ORDER — LIDOCAINE VISCOUS HCL 2 % MT SOLN: 10.0000 mL | OROMUCOSAL | 0 refills | Status: AC | PRN

## 2023-11-21 NOTE — ED Provider Notes (Signed)
 [facility]    CSN: [id number] Arrival date & time: 11/21/23  1554      History   Chief Complaint No chief complaint on file.   HPI Kimberly Norris is a 28 y.o. female.   Patient presenting today with 2-day history of sore swollen feeling throat, body aches, congestion, mild cough.  Denies fever, chest pain, shortness of breath, vomiting, diarrhea, rashes.  So far trying DayQuil with minimal relief.    Past Medical History:  Diagnosis Date   Bilateral chronic knee pain 04/16/2012   GERD (gastroesophageal reflux disease)    Heartburn    Liver disease    fatty liver   Mental disorder    anxiety, depression   Rhabdomyolysis     Patient Active Problem List   Diagnosis Date Noted   Superficial fungus infection of skin 08/28/2023   Folliculitis 06/14/2023   Screening examination for STD (sexually transmitted disease) 06/14/2023   Routine Papanicolaou smear 06/14/2023   Pelvic cramping 06/14/2023   Pain in left upper arm 04/11/2022   Nexplanon  insertion 04/06/2022   General counseling and advice for contraceptive management 03/13/2022   Pregnancy examination or test, negative result 03/13/2022   Rectal bleeding 12/02/2020   Grade III hemorrhoids 11/16/2020   Family history of breast cancer in mother 11/03/2020   Nipple pain 11/03/2020   Nexplanon  in place 11/03/2020   Adjustment disorder with emotional disturbance    Self-injurious behavior    Dysphagia 04/29/2020   Abdominal wall bulge 04/29/2020   Alternating constipation and diarrhea 03/17/2020   RUQ abdominal pain 03/17/2020   Anal fissure 02/17/2020   Skin tag of perianal region 02/17/2020   Cold sore 12/18/2019   Gastroesophageal reflux disease 08/18/2019   Fatty liver 08/18/2019   Traumatic rhabdomyolysis 07/12/2015   Rhabdomyolysis 07/12/2015   Acquired genu valgum, 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  All pathology was benign.    HEMORRHOID SURGERY N/A 12/01/2020   Procedure: HEMORRHOIDECTOMY; SIMPLE;  Surgeon: Kallie Manuelita BROCKS, MD;  Location: [facility];  Service: General;  Laterality: N/A;   MOUTH SURGERY     molars removed   NO PAST SURGERIES      OB History     Gravida  0   Para  0   Term  0   Preterm  0   AB  0   Living  0      SAB  0   IAB  0   Ectopic  0   Multiple  0   Live Births  0            Home Medications    Prior to Admission medications   Medication Sig Start Date End Date Taking? Authorizing Provider  lidocaine  (XYLOCAINE ) 2 % solution Use as directed 10 mLs in the mouth or throat every 3 (three) hours as needed. 11/21/23  Yes [name], PA-C  baclofen  (LIORESAL ) 10 MG tablet Take 1 tablet (10 mg total) by mouth 3 (three) times daily. 09/25/23   Reddick, Johnathan B, NP  calcium  carbonate (  TUMS - DOSED IN MG ELEMENTAL CALCIUM ) 500 MG chewable tablet Chew 1 tablet by mouth daily as needed for heartburn. As needed    [provider]  diclofenac  (VOLTAREN ) 50 MG EC tablet Take 1 tablet (50 mg total) by mouth 2 (two) times daily. 09/25/23   Reddick, Johnathan B, NP  ergocalciferol  (VITAMIN D2) 1.25 MG (50000 UT) capsule 1 capsule Orally ONCE WEEKLY 01/10/22   [provider]  etonogestrel  (NEXPLANON ) 68 MG IMPL implant 1 each by Subdermal route once.    [provider]  fluticasone  (FLONASE ) 50 MCG/ACT nasal spray Place 2 sprays into both nostrils daily. 01/19/23   Leath-Warren, Etta PARAS, NP  Metamucil Fiber  CHEW Chew 2 each by mouth daily.    [provider]  mupirocin  ointment (BACTROBAN ) 2 % Apply 1 Application topically 2 (two) times daily. 05/04/23   [name], PA-C  nystatin  ointment (MYCOSTATIN ) Apply 1 Application topically 2 (two) times daily. 08/29/23   Signa Delon LABOR, NP  nystatin -triamcinolone  (MYCOLOG II) cream Apply 1 Application topically 2 (two) times daily. 08/28/23   Signa Delon LABOR, NP  Probiotic Product (PROBIOTIC DAILY PO) Take 1 each by mouth daily.    [provider]  triamcinolone  (KENALOG ) 0.025 % ointment Apply 1 Application topically 2 (two) times daily. 08/29/23   Signa Delon LABOR, NP    Family History Family History  Problem Relation Age of Onset   Breast cancer Mother 35   Autism Sister        1 sister    Social History Social History   Tobacco Use   Smoking status: Every Day    Current packs/day: 0.25    Average packs/day: 0.3 packs/day for 5.0 years (1.3 ttl pk-yrs)    Types: Cigarettes   Smokeless tobacco: Never   Tobacco comments:    smokes 1-2 cig daily  Vaping Use   Vaping status: Never Used  Substance Use Topics   Alcohol use: Not Currently   Drug use: No     Allergies   Other, Flagyl [metronidazole], and Tinidazole   Review of Systems Review of Systems Per HPI  Physical Exam Triage Vital Signs ED Triage Vitals  Encounter Vitals Group     BP 11/21/23 1636 112/78     Girls Systolic BP Percentile --      Girls Diastolic BP Percentile --      Boys Systolic BP Percentile --      Boys Diastolic BP Percentile --      Pulse Rate 11/21/23 1636 (!) 110     Resp 11/21/23 1636 18     Temp 11/21/23 1636 98.8 F (37.1 C)     Temp Source 11/21/23 1636 Oral     SpO2 11/21/23 1636 98 %     Weight --      Height --      Head Circumference --      Peak Flow --      Pain Score 11/21/23 1637 9     Pain Loc --      Pain Education --      Exclude from Growth Chart --    No data found.  Updated Vital  Signs BP 112/78 (BP Location: Right Arm)   Pulse (!) 110   Temp 98.8 F (37.1 C) (Oral)   Resp 18   LMP 11/06/2023 (Within Days)   SpO2 98%   Visual Acuity Right Eye Distance:   Left Eye Distance:   Bilateral Distance:  Right Eye Near:   Left Eye Near:    Bilateral Near:     Physical Exam Vitals and nursing note reviewed.  Constitutional:      Appearance: Normal appearance.  HENT:     Head: Atraumatic.     Right Ear: Tympanic membrane and external ear normal.     Left Ear: Tympanic membrane and external ear normal.     Nose: Rhinorrhea present.     Mouth/Throat:     Mouth: Mucous membranes are moist.     Pharynx: Posterior oropharyngeal erythema present. No oropharyngeal exudate.  Eyes:     Extraocular Movements: Extraocular movements intact.     Conjunctiva/sclera: Conjunctivae normal.  Cardiovascular:     Rate and Rhythm: Normal rate and regular rhythm.     Heart sounds: Normal heart sounds.  Pulmonary:     Effort: Pulmonary effort is normal.     Breath sounds: Normal breath sounds. No wheezing.  Musculoskeletal:        General: Normal range of motion.     Cervical back: Normal range of motion and neck supple.  Lymphadenopathy:     Cervical: No cervical adenopathy.  Skin:    General: Skin is warm and dry.  Neurological:     Mental Status: She is alert and oriented to person, place, and time.  Psychiatric:        Mood and Affect: Mood normal.        Thought Content: Thought content normal.      UC Treatments / Results  Labs (all labs ordered are listed, but only abnormal results are displayed) Labs Reviewed  CULTURE, GROUP A STREP [facility]  POCT RAPID STREP A (OFFICE)    EKG   Radiology No results found.  Procedures Procedures (including critical care time)  Medications Ordered in UC Medications - No data to display  Initial Impression / Assessment and Plan / UC Course  I have reviewed the triage vital signs and the nursing  notes.  Pertinent labs & imaging results that were available during my care of the patient were reviewed by me and considered in my medical decision making (see chart for details).     Rapid strep negative, throat culture pending.  Mildly tachycardic in triage, otherwise vital signs reassuring.  Suspect viral upper respiratory infection.  Will treat with viscous lidocaine , supportive over-the-counter medications and home care.  Return for worsening or unresolving symptoms.  Final Clinical Impressions(s) / UC Diagnoses   Final diagnoses:  Viral URI   Discharge Instructions   None    ED Prescriptions     Medication Sig Dispense Auth. Provider   lidocaine  (XYLOCAINE ) 2 % solution Use as directed 10 mLs in the mouth or throat every 3 (three) hours as needed. 100 mL [name], [place]      PDMP not reviewed this encounter.   [name], [place] 11/21/23 [id number]

## 2023-11-21 NOTE — ED Triage Notes (Signed)
 Pt reports sore throat difficulty  swallowing, body aches, x 2 days.

## 2023-11-24 ENCOUNTER — Ambulatory Visit
Admission: EM | Admit: 2023-11-24 | Discharge: 2023-11-24 | Disposition: A | Discharge status: Home / Self Care | Payer: MEDICAID | Attending: Nurse Practitioner | Admitting: Nurse Practitioner

## 2023-11-24 ENCOUNTER — Other Ambulatory Visit: Payer: Self-pay

## 2023-11-24 DIAGNOSIS — R059 Cough, unspecified: Secondary | ICD-10-CM

## 2023-11-24 DIAGNOSIS — J069 Acute upper respiratory infection, unspecified: Secondary | ICD-10-CM

## 2023-11-24 LAB — POC SOFIA SARS ANTIGEN FIA: SARS Coronavirus 2 Ag: NEGATIVE

## 2023-11-24 LAB — CULTURE, GROUP A STREP (THRC)

## 2023-11-24 MED ORDER — FLUTICASONE PROPIONATE 50 MCG/ACT NA SUSP: 2.0000 | Freq: Every day | NASAL | 0 refills | Status: AC

## 2023-11-24 MED ORDER — PROMETHAZINE-DM 6.25-15 MG/5ML PO SYRP: 5.0000 mL | ORAL_SOLUTION | Freq: Four times a day (QID) | ORAL | 0 refills | Status: AC | PRN

## 2023-11-24 MED ORDER — AZITHROMYCIN 250 MG PO TABS: 250.0000 mg | ORAL_TABLET | Freq: Every day | ORAL | 0 refills | Status: AC

## 2023-11-24 NOTE — Discharge Instructions (Addendum)
 The COVID test was negative. Take medication as prescribed. Increase fluids and allow for plenty of rest. You may take over-the-counter Tylenol  or ibuprofen  as needed for pain, fever, or general discomfort. Recommend the use of normal saline nasal spray throughout the day for nasal congestion and runny nose. For your cough, recommend use of a humidifier in your bedroom at nighttime during sleep and sleeping elevated on pillows while symptoms persist. If your symptoms fail to improve with this treatment, recommend follow-up with your primary care physician for further evaluation. Follow-up as needed.

## 2023-11-24 NOTE — ED Provider Notes (Signed)
 [facility]    CSN: [id number] Arrival date & time: 11/24/23  9094      History   Chief Complaint Chief Complaint  Patient presents with   Nasal Congestion   Cough   Otalgia    HPI Kimberly Norris is a 28 y.o. female.   The history is provided by the patient.   Patient presents for complaints of cough, nasal congestion, and bilateral ear pain.  Patient states she was seen in this clinic on 11/5 for sore throat.  She states that she continues to experience sore throat.  She states that she has not found any relief with over-the-counter medications, states that she also purchased a humidifier and has been using that.  She denies fever, chills, ear drainage, wheezing, difficulty breathing, abdominal pain, nausea, vomiting, or diarrhea.  Past Medical History:  Diagnosis Date   Bilateral chronic knee pain 04/16/2012   GERD (gastroesophageal reflux disease)    Heartburn    Liver disease    fatty liver   Mental disorder    anxiety, depression   Rhabdomyolysis     Patient Active Problem List   Diagnosis Date Noted   Superficial fungus infection of skin 08/28/2023   Folliculitis 06/14/2023   Screening examination for STD (sexually transmitted disease) 06/14/2023   Routine Papanicolaou smear 06/14/2023   Pelvic cramping 06/14/2023   Pain in left upper arm 04/11/2022   Nexplanon  insertion 04/06/2022   General counseling and advice for contraceptive management 03/13/2022   Pregnancy examination or test, negative result 03/13/2022   Rectal bleeding 12/02/2020   Grade III hemorrhoids 11/16/2020   Family history of breast cancer in mother 11/03/2020   Nipple pain 11/03/2020   Nexplanon  in place 11/03/2020   Adjustment disorder with emotional disturbance    Self-injurious behavior    Dysphagia 04/29/2020   Abdominal wall bulge 04/29/2020   Alternating constipation and diarrhea 03/17/2020   RUQ abdominal pain 03/17/2020   Anal fissure 02/17/2020   Skin tag of  perianal region 02/17/2020   Cold sore 12/18/2019   Gastroesophageal reflux disease 08/18/2019   Fatty liver 08/18/2019   Traumatic rhabdomyolysis 07/12/2015   Rhabdomyolysis 07/12/2015   Acquired genu valgum, 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  All pathology was benign.    HEMORRHOID SURGERY N/A 12/01/2020   Procedure: HEMORRHOIDECTOMY; SIMPLE;  Surgeon: Kallie Manuelita BROCKS, MD;  Location: [facility];  Service: General;  Laterality: N/A;   MOUTH SURGERY     molars removed   NO PAST SURGERIES      OB History     Gravida  0   Para  0   Term  0   Preterm  0   AB  0   Living  0      SAB  0   IAB  0   Ectopic  0   Multiple  0   Live Births  0            Home Medications    Prior to Admission medications   Medication Sig Start Date End  Date Taking? Authorizing Provider  baclofen  (LIORESAL ) 10 MG tablet Take 1 tablet (10 mg total) by mouth 3 (three) times daily. Patient not taking: Reported on 11/24/2023 09/25/23   Reddick, Johnathan B, NP  calcium  carbonate (TUMS - DOSED IN MG ELEMENTAL CALCIUM ) 500 MG chewable tablet Chew 1 tablet by mouth daily as needed for heartburn. As needed    [provider]  diclofenac  (VOLTAREN ) 50 MG EC tablet Take 1 tablet (50 mg total) by mouth 2 (two) times daily. Patient not taking: Reported on 11/24/2023 09/25/23   Reddick, Johnathan B, NP  ergocalciferol  (VITAMIN D2) 1.25 MG (50000 UT) capsule 1 capsule Orally ONCE WEEKLY 01/10/22    [provider]  etonogestrel  (NEXPLANON ) 68 MG IMPL implant 1 each by Subdermal route once.    [provider]  fluticasone  (FLONASE ) 50 MCG/ACT nasal spray Place 2 sprays into both nostrils daily. 01/19/23   Leath-Warren, Etta PARAS, NP  lidocaine  (XYLOCAINE ) 2 % solution Use as directed 10 mLs in the mouth or throat every 3 (three) hours as needed. 11/21/23   Kimberly Vernell Norris, PA-C  Metamucil Fiber CHEW Chew 2 each by mouth daily.    [provider]  mupirocin  ointment (BACTROBAN ) 2 % Apply 1 Application topically 2 (two) times daily. Patient not taking: Reported on 11/24/2023 05/04/23   Kimberly Vernell Norris, PA-C  nystatin  ointment (MYCOSTATIN ) Apply 1 Application topically 2 (two) times daily. 08/29/23   Signa Delon LABOR, NP  nystatin -triamcinolone  (MYCOLOG II) cream Apply 1 Application topically 2 (two) times daily. 08/28/23   Signa Delon LABOR, NP  Probiotic Product (PROBIOTIC DAILY PO) Take 1 each by mouth daily.    [provider]  triamcinolone  (KENALOG ) 0.025 % ointment Apply 1 Application topically 2 (two) times daily. 08/29/23   Signa Delon LABOR, NP    Family History Family History  Problem Relation Age of Onset   Breast cancer Mother 20   Autism Sister        1 sister    Social History Social History   Tobacco Use   Smoking status: Every Day    Current packs/day: 0.25    Average packs/day: 0.3 packs/day for 5.0 years (1.3 ttl pk-yrs)    Types: Cigarettes   Smokeless tobacco: Never   Tobacco comments:    smokes 1-2 cig daily  Vaping Use   Vaping status: Never Used  Substance Use Topics   Alcohol use: Not Currently   Drug use: No     Allergies   Other, Flagyl [metronidazole], and Tinidazole   Review of Systems Review of Systems Per HPI  Physical Exam Triage Vital Signs ED Triage Vitals  Encounter Vitals Group     BP 11/24/23 0946 132/82     Girls Systolic BP Percentile --      Girls Diastolic BP Percentile  --      Boys Systolic BP Percentile --      Boys Diastolic BP Percentile --      Pulse Rate 11/24/23 0946 88     Resp 11/24/23 0946 17     Temp 11/24/23 0946 97.8 F (36.6 C)     Temp Source 11/24/23 0946 Oral     SpO2 11/24/23 0946 97 %     Weight --      Height --      Head Circumference --      Peak Flow --      Pain Score 11/24/23 0943 9     Pain Loc --  Pain Education --      Exclude from Growth Chart --    No data found.  Updated Vital Signs BP 132/82 (BP Location: Right Arm)   Pulse 88   Temp 97.8 F (36.6 C) (Oral)   Resp 17   LMP 11/06/2023 (Within Days)   SpO2 97%   Visual Acuity Right Eye Distance:   Left Eye Distance:   Bilateral Distance:    Right Eye Near:   Left Eye Near:    Bilateral Near:     Physical Exam Vitals and nursing note reviewed.  Constitutional:      General: She is not in acute distress.    Appearance: Normal appearance. She is well-developed.  HENT:     Head: Normocephalic and atraumatic.     Right Ear: Tympanic membrane, ear canal and external ear normal. Tenderness present.     Left Ear: Tympanic membrane, ear canal and external ear normal. Tenderness present.     Ears:     Comments: Bilateral ear canals.    Nose: Congestion present.     Right Turbinates: Enlarged and swollen.     Left Turbinates: Enlarged and swollen.     Right Sinus: No maxillary sinus tenderness or frontal sinus tenderness.     Left Sinus: No maxillary sinus tenderness or frontal sinus tenderness.     Mouth/Throat:     Lips: Pink.     Mouth: Mucous membranes are moist.     Pharynx: Uvula midline. Posterior oropharyngeal erythema and postnasal drip present. No pharyngeal swelling, oropharyngeal exudate or uvula swelling.     Comments: Cobblestoning present to posterior oropharynx  Eyes:     Extraocular Movements: Extraocular movements intact.     Conjunctiva/sclera: Conjunctivae normal.     Pupils: Pupils are equal, round, and reactive to light.   Neck:     Thyroid : No thyromegaly.     Trachea: No tracheal deviation.  Cardiovascular:     Rate and Rhythm: Normal rate and regular rhythm.     Pulses: Normal pulses.     Heart sounds: Normal heart sounds.  Pulmonary:     Effort: Pulmonary effort is normal. No respiratory distress.     Breath sounds: Normal breath sounds. No stridor. No wheezing, rhonchi or rales.  Abdominal:     General: Bowel sounds are normal.     Palpations: Abdomen is soft.     Tenderness: There is no abdominal tenderness.  Musculoskeletal:     Cervical back: Normal range of motion and neck supple.  Skin:    General: Skin is warm and dry.  Neurological:     General: No focal deficit present.     Mental Status: She is alert and oriented to person, place, and time.  Psychiatric:        Mood and Affect: Mood normal.        Behavior: Behavior normal.        Thought Content: Thought content normal.        Judgment: Judgment normal.      UC Treatments / Results  Labs (all labs ordered are listed, but only abnormal results are displayed) Labs Reviewed  POC SOFIA SARS ANTIGEN FIA    EKG   Radiology No results found.  Procedures Procedures (including critical care time)  Medications Ordered in UC Medications - No data to display  Initial Impression / Assessment and Plan / UC Course  I have reviewed the triage vital signs and the nursing notes.  Pertinent labs &  imaging results that were available during my care of the patient were reviewed by me and considered in my medical decision making (see chart for details).  The COVID test was negative.  On exam, the patient's lung sounds are clear throughout, room air sats are at 97%.  Patient was seen in this clinic on 11/5, but returns today for worsening symptoms.  Due to ongoing symptoms with no improvement of over-the-counter medications, concern for possible bacterial etiology.  Will treat empirically with azithromycin  250 mg.  Symptomatic treatment  provided with Promethazine DM for the cough, and fluticasone  50 mcg nasal spray for nasal congestion and runny nose.  Supportive care recommendations were provided and discussed with the patient to include fluids, rest, over-the-counter analgesics, normal saline nasal spray, and continuing use of a humidifier during sleep.  Discussed indications with patient regarding follow-up.  Patient was in agreement with this plan of care and verbalizes understanding.  All questions were answered.  Patient stable for discharge.  Final Clinical Impressions(s) / UC Diagnoses   Final diagnoses:  Cough, unspecified type   Discharge Instructions   None    ED Prescriptions   None    PDMP not reviewed this encounter.   Gilmer Etta PARAS, NP 11/24/23 1123

## 2023-11-24 NOTE — ED Triage Notes (Signed)
 Pt being seen in UC for cough, ear pain, and nasal congestion. Pt reports some chest discomfort from cough. Pt reports recently seen here on 11/5 for similar s/s, no relief. Pt reports using humidifier and other otc medication with no relief.

## 2023-11-26 ENCOUNTER — Ambulatory Visit (HOSPITAL_COMMUNITY): Payer: Self-pay

## 2023-12-01 ENCOUNTER — Emergency Department (HOSPITAL_COMMUNITY)
Admission: EM | Admit: 2023-12-01 | Discharge: 2023-12-01 | Disposition: A | Discharge status: Home / Self Care | Payer: MEDICAID | Attending: Emergency Medicine | Admitting: Emergency Medicine

## 2023-12-01 DIAGNOSIS — H6993 Unspecified Eustachian tube disorder, bilateral: Secondary | ICD-10-CM | POA: Insufficient documentation

## 2023-12-01 DIAGNOSIS — B3731 Acute candidiasis of vulva and vagina: Secondary | ICD-10-CM | POA: Insufficient documentation

## 2023-12-01 DIAGNOSIS — H9203 Otalgia, bilateral: Secondary | ICD-10-CM | POA: Diagnosis present

## 2023-12-01 LAB — RESP PANEL BY RT-PCR (RSV, FLU A&B, COVID)  RVPGX2
Influenza A by PCR: NEGATIVE
Influenza B by PCR: NEGATIVE
Resp Syncytial Virus by PCR: NEGATIVE
SARS Coronavirus 2 by RT PCR: NEGATIVE

## 2023-12-01 MED ORDER — FLUCONAZOLE 150 MG PO TABS: 150.0000 mg | ORAL_TABLET | Freq: Once | ORAL | 0 refills | Status: AC

## 2023-12-01 NOTE — Discharge Instructions (Signed)
 You are seen in the emergency room for continued ear pain.  You have some fluid and pressure in your ears, continue the antibiotics and Flonase , you can take over-the-counter Sudafed as well, drink plenty of fluids, follow-up with ENT.  We are also treating you for a vaginal yeast infection related to the antibiotics.  Come back to the ER if you have new or worsening symptoms.

## 2023-12-01 NOTE — ED Provider Notes (Signed)
 [place] EM[facility]ENC[facility]PARTMENT AT [facility] Provider Note   CSN: [id number] Arrival date & time: 12/01/23  1513     Patient presents with: Otalgia   Kimberly Norris is a 28 y.o. female.   {Add pertinent medical, surgical, social history, OB history to YEP:67052}  Otalgia      Prior to Admission medications   Medication Sig Start Date End Date Taking? Authorizing Provider  fluconazole  (DIFLUCAN ) 150 MG tablet Take 1 tablet (150 mg total) by mouth once for 1 dose. Take second dose if your symptoms persist after 3 days 12/01/23 12/01/23 Yes Daveon Arpino A, PA-C  azithromycin  (ZITHROMAX ) 250 MG tablet Take 1 tablet (250 mg total) by mouth daily. Take first 2 tablets together, then 1 every day until finished. 11/24/23   Leath-Warren, Etta PARAS, NP  baclofen  (LIORESAL ) 10 MG tablet Take 1 tablet (10 mg total) by mouth 3 (three) times daily. Patient not taking: Reported on 11/24/2023 09/25/23   Reddick, Johnathan B, NP  calcium  carbonate (TUMS - DOSED IN MG ELEMENTAL CALCIUM ) 500 MG chewable tablet Chew 1 tablet by mouth daily as needed for heartburn. As needed    [provider]  diclofenac  (VOLTAREN ) 50 MG EC tablet Take 1 tablet (50 mg total) by mouth 2 (two) times daily. Patient not taking: Reported on 11/24/2023 09/25/23   Reddick, Johnathan B, NP  ergocalciferol  (VITAMIN D2) 1.25 MG (50000 UT) capsule 1 capsule Orally ONCE WEEKLY 01/10/22   [provider]  etonogestrel  (NEXPLANON ) 68 MG IMPL implant 1 each by Subdermal route once.    [provider]  fluticasone  (FLONASE ) 50 MCG/ACT nasal spray Place 2 sprays into both nostrils daily. 11/24/23   Leath-Warren, Etta PARAS, NP  lidocaine  (XYLOCAINE ) 2 % solution Use as directed 10 mLs in the mouth or throat every 3 (three) hours as needed. 11/21/23   [name], PA-C  Metamucil Fiber CHEW Chew 2 each by mouth daily.    [provider]  mupirocin  ointment (BACTROBAN ) 2 % Apply 1  Application topically 2 (two) times daily. Patient not taking: Reported on 11/24/2023 05/04/23   [name], PA-C  nystatin  ointment (MYCOSTATIN ) Apply 1 Application topically 2 (two) times daily. 08/29/23   [name], NP  nystatin -triamcinolone  (MYCOLOG II) cream Apply 1 Application topically 2 (two) times daily. 08/28/23   [name], NP  Probiotic Product (PROBIOTIC DAILY PO) Take 1 each by mouth daily.    [provider]  promethazine-dextromethorphan (PROMETHAZINE-DM) 6.25-15 MG/5ML syrup Take 5 mLs by mouth 4 (four) times daily as needed. 11/24/23   Leath-Warren, Etta PARAS, NP  triamcinolone  (KENALOG ) 0.025 % ointment Apply 1 Application topically 2 (two) times daily. 08/29/23   [name], NP    Allergies: Other, Flagyl [metronidazole], and Tinidazole    Review of Systems  HENT:  Positive for ear pain.     Updated Vital Signs BP 126/75 (BP Location: Right Arm)   Pulse 79   Temp 98.5 F (36.9 C) (Oral)   Resp 18   Ht 5' 1 (1.549 m)   Wt 87.1 kg   LMP 11/06/2023 (Within Days)   SpO2 100%   BMI 36.28 kg/m   Physical Exam  (all labs ordered are listed, but only abnormal results are displayed) Labs Reviewed  RESP PANEL BY RT-PCR (RSV, FLU A&B, COVID)  [id number]    EKG: None  Radiology: No results found.  {Document cardiac monitor, telemetry assessment procedure when appropriate:32947} Procedures  Medications Ordered in the ED - No data to display    {Click here for ABCD2, HEART and other calculators REFRESH Note before signing:1}                              Medical Decision Making Risk Prescription drug management.   ***  {Document critical care time when appropriate  Document review of labs and clinical decision tools ie CHADS2VASC2, etc  Document your independent review of radiology images and any outside records  Document your discussion with family members, caretakers and with consultants  Document social  determinants of health affecting pt's care  Document your decision making why or why not admission, treatments were needed:32947:::1}   Final diagnoses:  Dysfunction of both eustachian tubes  Candidal vaginitis    ED Discharge Orders          Ordered    fluconazole  (DIFLUCAN ) 150 MG tablet   Once        12/01/23 1715

## 2023-12-01 NOTE — ED Triage Notes (Signed)
 Pt comes in for left ear pain for the past 2 weeks. Pt has been to the UC x2. Pt was given an abx and steroid but no relief. Pt states pain started on the right ear and moved to the left side. Pt states she has had changes in her hearing. A&Ox4.

## 2024-02-14 ENCOUNTER — Encounter: Payer: MEDICAID | Admitting: Advanced Practice Midwife

## 2024-04-25 ENCOUNTER — Encounter: Payer: MEDICAID | Admitting: Adult Health
